# Patient Record
Sex: Female | Born: 1975 | Race: Asian | Hispanic: No | Marital: Married | Smoking: Never smoker
Health system: Southern US, Community
[De-identification: ages and names within clinical notes are randomized; demographics above are authoritative.]

## PROBLEM LIST (undated history)

## (undated) DIAGNOSIS — Z8619 Personal history of other infectious and parasitic diseases: Secondary | ICD-10-CM

## (undated) DIAGNOSIS — Z789 Other specified health status: Secondary | ICD-10-CM

## (undated) DIAGNOSIS — Z98891 History of uterine scar from previous surgery: Secondary | ICD-10-CM

## (undated) DIAGNOSIS — Z5189 Encounter for other specified aftercare: Secondary | ICD-10-CM

## (undated) DIAGNOSIS — Z8632 Personal history of gestational diabetes: Secondary | ICD-10-CM

## (undated) DIAGNOSIS — Z87898 Personal history of other specified conditions: Secondary | ICD-10-CM

## (undated) HISTORY — DX: History of uterine scar from previous surgery: Z98.891

## (undated) HISTORY — PX: TUBAL LIGATION: SHX77

## (undated) HISTORY — PX: OTHER SURGICAL HISTORY: SHX169

## (undated) HISTORY — DX: Personal history of other infectious and parasitic diseases: Z86.19

## (undated) HISTORY — PX: CERVICAL FUSION: SHX112

## (undated) HISTORY — DX: Personal history of gestational diabetes: Z86.32

## (undated) HISTORY — DX: Personal history of other specified conditions: Z87.898

## (undated) HISTORY — DX: Encounter for other specified aftercare: Z51.89

---

## 2004-03-23 HISTORY — PX: BREAST CYST EXCISION: SHX579

## 2010-01-16 ENCOUNTER — Encounter: Payer: Self-pay | Admitting: Internal Medicine

## 2010-02-24 ENCOUNTER — Encounter: Payer: Self-pay | Admitting: Internal Medicine

## 2010-04-11 ENCOUNTER — Ambulatory Visit
Admission: RE | Admit: 2010-04-11 | Discharge: 2010-04-11 | Payer: Self-pay | Source: Home / Self Care | Attending: Internal Medicine | Admitting: Internal Medicine

## 2010-04-11 DIAGNOSIS — R51 Headache: Secondary | ICD-10-CM | POA: Insufficient documentation

## 2010-04-11 DIAGNOSIS — R519 Headache, unspecified: Secondary | ICD-10-CM | POA: Insufficient documentation

## 2010-04-11 DIAGNOSIS — N644 Mastodynia: Secondary | ICD-10-CM | POA: Insufficient documentation

## 2010-04-30 NOTE — Assessment & Plan Note (Signed)
Summary: NEW TO EST UMR/MHF   Vital Signs:  Patient profile:   35 year old female Menstrual status:  regular LMP:     10/24/2009 Height:      62.5 inches Weight:      122 pounds BMI:     22.04 O2 Sat:      100 % on Room air Temp:     97.9 degrees F oral Pulse rate:   77 / minute Resp:     18 per minute BP sitting:   100 / 60  (right arm) Cuff size:   regular  Vitals Entered By: Glendell Docker CMA (April 11, 2010 9:46 AM)  O2 Flow:  Room air CC: New Patient  Is Patient Diabetic? No Pain Assessment Patient in pain? no      Comments c/o increase in headaches with cold weather, evaluation of left breast lump, onset two weeks ago-no pain,  LMP (date): 10/24/2009     Menstrual Status regular Enter LMP: 10/24/2009   Primary Care Provider:  Dondra Spry DO  CC:  New Patient .  History of Present Illness: 35  y/o female to establish unilateral headache cold weather makes it worse dull ache  (some improvement with tylenol) some nausea no photophobia severity 8 out 10 frequency no changes in vision  no prev history of migraines  she has check her BP at home when she has headache - reported normal  she also c/o left breat pain no palpalbe mass  no change in skin texture  Preventive Screening-Counseling & Management  Alcohol-Tobacco     Alcohol drinks/day: 0     Smoking Status: never  Caffeine-Diet-Exercise     Caffeine use/day: 1 beverage daily     Does Patient Exercise: no  Allergies (verified): No Known Drug Allergies  Past History:  Past Surgical History: Caesarean section    Family History: Family History of Arthritis Family History Diabetes 1st degree relative Family History High cholesterol Family History Hypertension CAD - MGF  cervical cancer - mother colon cancer - aunt asthma - brother  Social History: Married- one year 1 son Smoking Status:  never Caffeine use/day:  1 beverage daily Does Patient Exercise:  no  Review of  Systems  The patient denies fever, chest pain, syncope, dyspnea on exertion, abdominal pain, melena, hematochezia, severe indigestion/heartburn, and depression.    Physical Exam  General:  alert, well-developed, and well-nourished.   Head:  normocephalic and atraumatic.   Eyes:  pupils equal, pupils round, and pupils reactive to light.   Ears:  R ear normal and L ear normal.   Mouth:  pharynx pink and moist.   Neck:  No deformities, masses, or tenderness noted. Breasts:  No mass, nodules, thickening, tenderness, bulging, retraction, inflamation, nipple discharge or skin changes noted.   Lungs:  normal respiratory effort, normal breath sounds, no crackles, and no wheezes.   Heart:  normal rate, regular rhythm, no murmur, and no gallop.   Abdomen:  soft, non-tender, normal bowel sounds, no masses, no hepatomegaly, and no splenomegaly.   Extremities:  No lower extremity edema  Neurologic:  cranial nerves II-XII intact, gait normal, DTRs symmetrical and normal, and finger-to-nose normal.   Psych:  normally interactive, good eye contact, not anxious appearing, and not depressed appearing.     Impression & Recommendations:  Problem # 1:  BREAST PAIN, LEFT (ICD-611.71) pt c/o breast tenderness.  no palpable mass obtain mammo trial of avoiding caffeine and vit E Patient advised to call office  if symptoms persist or worsen.  Orders: Mammogram (Screening) (Mammo)  Problem # 2:  HEADACHE (ICD-784.0) question headaches related to sinus congestion worse with exposure to cold temps trial of antihistamine  Complete Medication List: 1)  Zyrtec Allergy 10 Mg Tabs (Cetirizine hcl) .... One by mouth once daily  Patient Instructions: 1)  Please schedule a follow-up appointment in 1 month. 2)  Call our office if your symptoms do not  improve or gets worse. 3)  Avoid caffeinated beverages 4)  You can try taking Vitamin E 800 International Units over the counter once daily x 2 weeks   Orders  Added: 1)  Mammogram (Screening) [Mammo] 2)  New Patient Level III [16109]   Immunization History:  Tetanus/Td Immunization History:    Tetanus/Td:  historical (05/28/2004)  Influenza Immunization History:    Influenza:  historical (02/04/2010)   Immunization History:  Tetanus/Td Immunization History:    Tetanus/Td:  Historical (05/28/2004)  Influenza Immunization History:    Influenza:  Historical (02/04/2010)  Current Allergies (reviewed today): No known allergies

## 2010-04-30 NOTE — Medication Information (Signed)
Summary: [facility]   Imported By: Sherian Rein 04/22/2010 09:27:08  _____________________________________________________________________  External Attachment:    Type:   Image     Comment:   External Document

## 2010-07-01 ENCOUNTER — Encounter: Payer: 59 | Attending: Obstetrics and Gynecology | Admitting: Dietician

## 2010-07-01 DIAGNOSIS — O99810 Abnormal glucose complicating pregnancy: Secondary | ICD-10-CM | POA: Insufficient documentation

## 2010-07-01 DIAGNOSIS — Z713 Dietary counseling and surveillance: Secondary | ICD-10-CM | POA: Insufficient documentation

## 2010-07-23 ENCOUNTER — Inpatient Hospital Stay (HOSPITAL_COMMUNITY): Admission: AD | Admit: 2010-07-23 | Payer: 59 | Source: Ambulatory Visit | Admitting: Obstetrics and Gynecology

## 2010-08-01 ENCOUNTER — Inpatient Hospital Stay (HOSPITAL_COMMUNITY): Admission: AD | Admit: 2010-08-01 | Payer: Self-pay | Admitting: Obstetrics and Gynecology

## 2010-08-05 ENCOUNTER — Other Ambulatory Visit: Payer: Self-pay | Admitting: Obstetrics and Gynecology

## 2010-08-05 ENCOUNTER — Encounter (HOSPITAL_COMMUNITY): Payer: 59

## 2010-08-05 LAB — BASIC METABOLIC PANEL
BUN: 8 mg/dL (ref 6–23)
Chloride: 104 mEq/L (ref 96–112)
Creatinine, Ser: 0.5 mg/dL (ref 0.4–1.2)
GFR calc non Af Amer: >60 mL/min (ref >60)
Glucose, Bld: 118 mg/dL — ABNORMAL HIGH (ref 70–99)
Potassium: 3.6 mEq/L (ref 3.5–5.1)

## 2010-08-05 LAB — CBC
HCT: 32 % — ABNORMAL LOW (ref 36.0–46.0)
MCH: 26.7 pg (ref 26.0–34.0)
MCV: 82.9 fL (ref 78.0–100.0)
Platelets: 215 10*3/uL (ref 150–400)
RDW: 15.7 % — ABNORMAL HIGH (ref 11.5–15.5)

## 2010-08-05 LAB — SURGICAL PCR SCREEN: Staphylococcus aureus: NEGATIVE

## 2010-08-06 ENCOUNTER — Other Ambulatory Visit: Payer: Self-pay | Admitting: Obstetrics and Gynecology

## 2010-08-06 ENCOUNTER — Inpatient Hospital Stay (HOSPITAL_COMMUNITY)
Admission: RE | Admit: 2010-08-06 | Discharge: 2010-08-09 | DRG: 766 | Disposition: A | Discharge status: Home / Self Care | Payer: 59 | Source: Ambulatory Visit | Attending: Obstetrics and Gynecology | Admitting: Obstetrics and Gynecology

## 2010-08-06 DIAGNOSIS — O9903 Anemia complicating the puerperium: Secondary | ICD-10-CM | POA: Diagnosis not present

## 2010-08-06 DIAGNOSIS — Z01812 Encounter for preprocedural laboratory examination: Secondary | ICD-10-CM

## 2010-08-06 DIAGNOSIS — D649 Anemia, unspecified: Secondary | ICD-10-CM | POA: Diagnosis not present

## 2010-08-06 DIAGNOSIS — O99814 Abnormal glucose complicating childbirth: Secondary | ICD-10-CM | POA: Diagnosis present

## 2010-08-06 DIAGNOSIS — O34219 Maternal care for unspecified type scar from previous cesarean delivery: Principal | ICD-10-CM | POA: Diagnosis present

## 2010-08-06 DIAGNOSIS — Z01818 Encounter for other preprocedural examination: Secondary | ICD-10-CM

## 2010-08-06 LAB — GLUCOSE, CAPILLARY: Glucose-Capillary: 82 mg/dL (ref 70–99)

## 2010-08-07 LAB — CBC
HCT: 26.2 % — ABNORMAL LOW (ref 36.0–46.0)
Hemoglobin: 8.4 g/dL — ABNORMAL LOW (ref 12.0–15.0)
MCV: 83.2 fL (ref 78.0–100.0)
RDW: 15.7 % — ABNORMAL HIGH (ref 11.5–15.5)
WBC: 16.9 10*3/uL — ABNORMAL HIGH (ref 4.0–10.5)

## 2010-08-08 LAB — GLUCOSE, CAPILLARY

## 2010-08-11 NOTE — Op Note (Signed)
[name], [name]           ACCOUNT NO.:  [id number]  MEDICAL RECORD NO.:  [id number]           PATIENT TYPE:  I  LOCATION:  9127                          FACILITY:  [facility]  PHYSICIAN:  [name], M.D. DATE OF BIRTH:  10/01/1975  DATE OF PROCEDURE:  08/06/2010 DATE OF DISCHARGE:                              OPERATIVE REPORT   PREOPERATIVE DIAGNOSES: 1. Intrauterine pregnancy at 39 weeks and 6 days. 2. Previous cesarean section.  POSTOPERATIVE DIAGNOSES: 1. Intrauterine pregnancy at 39 weeks and 6 days. 2. Previous cesarean section.  ANESTHESIA:  Spinal, Natalie Pou, MD  PROCEDURE:  Repeat low transverse cesarean section.  SURGEON:  Natalie Fat. Avrian Delfavero, MD  ASSISTANT:  [name], WHNP, CNM  ESTIMATED BLOOD LOSS:  900 mL.  DESCRIPTION OF PROCEDURE:  After being informed of the planned procedure with possible complications including bleeding, infection, injury to other organs, informed consent was obtained.  The patient was taken to OR #2, given spinal anesthesia by Dr. Malen Fields without any complications. She was placed in the dorsal decubitus position, pelvis tilted to the left, prepped and draped in a sterile fashion, and a Foley catheter was inserted in her bladder.  After assessing adequate level of anesthesia, we infiltrated the suprapubic area with 20 mL of Marcaine 0.25 and performed a Pfannenstiel incision which was brought down sharply to the fascia.  The fascia was incised in a low transverse fashion.  Linea alba was dissected.  Peritoneum was entered bluntly.  An Alexis retractor was easily inserted and we proceeded with lysis of adhesion between the bladder fold and the mid uterine body.  This then gives [place] access to the visceral peritoneum that was entered in a low transverse fashion, allowing [place] to safely retract bladder by developing a bladder flap.  We can then enter the lower uterine segment first with knife, then extended bluntly in a low  transverse fashion.  Amniotic fluid was clear.  We assisted the birth of a female infant at 1:31 p.m.  Mouth and nose were suctioned.  Two nuchal cords were reduced.  Baby was delivered.  Cord was clamped with 2 Kelly clamps and suctioned and baby was given to the neonatologist present in the room.  A 10 mL of blood was drawn from the umbilical vein and the placenta was allowed to deliver spontaneously.  It was complete.  Cord has 3 vessels. It was sent to Labor and Delivery.  Uterine revision was negative.  The myometrium was closed in two layers, first with a running locked suture of 0 Vicryl, then with a Lembert suture of 0 Vicryl, imbricating the first one.  Hemostasis was then completed on the right angle with 3 figure-of-eight stitches of 0 Vicryl and midline with 1 figure-of-eight stitch of 0 Vicryl.  We then completed hemostasis with cauterization of peritoneal edges.  Both paracolic gutters were cleaned.  Both tubes and ovaries assessed and normal.  The pelvis was profusely irrigated with warm saline to confirm a satisfactory hemostasis.  Under fascia hemostasis was completed with cauterization and the fascia was closed with 2 running sutures of 1 Vicryl, meeting midline.  Wound  was irrigated with warm saline.  Hemostasis was adequate.  We closed the skin with a subcuticular suture of 3-0 Monocryl and Steri-Strips.  Instrument and sponge count was complete x2.  Estimated blood loss was 900 mL.  The procedure was well tolerated by the patient who was taken to recovery room in a well and stable condition.  [name], [name], was born at 1:31 p.m., received an Apgar of 9 at 1 minute and 9 at 5 minutes and weighed 7 pounds and 11 ounces.  SPECIMEN:  Placenta sent to Labor and Delivery.     Natalie Fat Ayaana Biondo, M.D.     SAR/MEDQ  D:  08/06/2010  T:  08/07/2010  Job:  [id number]  Electronically Signed by [name] M.D. on 08/11/2010 04:48:16 PM

## 2010-09-10 NOTE — Discharge Summary (Signed)
NAME:  [name], [name]           ACCOUNT NO.:  [id number]  MEDICAL RECORD NO.:  [id number]           PATIENT TYPE:  LOCATION:                                 FACILITY:  PHYSICIAN:  Kadijah Shamoon A. Anton Cheramie, M.D. DATE OF BIRTH:  Oct 29, 1975  DATE OF ADMISSION:  08/06/2010 DATE OF DISCHARGE:  08/09/2010                              DISCHARGE SUMMARY   ADMITTING DIAGNOSES: 1. Previous C-section, desired repeat for suspected macrosomia. 2. Class A1 gestational diabetes mellitus.  DISCHARGE DIAGNOSES: 1. Previous C-section, desired repeat for suspected macrosomia. 2. Class A1 gestational diabetes mellitus.  HOSPITAL PROCEDURES:  Low-transverse cesarean section under spinal anesthesia.  HOSPITAL COURSE:  She presented on surgery day to same day surgery admission, was found to be stable desiring a repeat C-section for suspected macrosomic infant.  By ultrasound on the previous day the baby was 8+ pounds and she had [place]  She proceeded to the OR, had spinal anesthesia placed and delivered a female "[name]" at 13:31, Apgar's were 9 at 1 minutes and 9 at 5 minutes, weight was 7 pounds 11 ounces the infant was taken to the newborn nursery in stable condition.  Placenta was taken to labor and delivery.  She did have 2 nuchal cords noted at the delivery.  After surgery she was moved to the PACU where she was deemed stable and sent to the Mother Baby Unit.   Her hospital course included: on postop day #0 she had adequate urine output which was clear. Vital signs were stable,  she was afebrile, and receiving minimal narcotic anesthesia for pain management.    On postop day #1, she was up ad lib after her Foley was removed, voiding and ambulating without difficulty and her Vital signs were stable.  Vaginal bleeding was light.  She was tolerating a regular diet.  She was breast and bottle feeding the baby. Her vital signs were 98.7, 76, 18, and 95/65.  Her  white count was 16.9 up from 11,000 the previous day, H&H 8.4 and 26.2 down from 10.3 and 32 the previous day and her platelet count was 190 down from 215.  Her lungs were clear.  Her abdomen was soft.  She had SCDs on and her blood glucose levels were 70s-80s on just a routine diet.    On postop day #2, she was doing well about a bed, tolerating pain with minimal narcotic use and her incision was open to air with Steri-Strips and was clean, dry, and intact.  Her fundus was firm.  She had minimal bleeding.  She had positive bowel sounds and passing flatus.    On postop day #3, she was ambulating and voiding on her own, and tolerating activities of daily living without difficulty.  Her vitals were stable 97.7, 99, 20, and 94/61.  Blood glucose was 73 on a regular diet.  She did desire discharge home and she was discharged home in stable condition after receiving the full course of the benefits of her stay.    Her discharge instructions were per the [facility] handout.  Specific instructions for no driving x2 weeks  or while on narcotic medications.  She was discharged home on the following medications: 1. Percocet 5/325, dispensed #40, 1-2 q.4-6 h. p.r.n. severe pain. 2. Motrin 600 mg 1 p.o. q.6 h. p.r.n. pain.  Discharge followup is in 6 weeks with [facility] or p.r.n.    ______________________________ Anabel Halon, [facility], CNM   ______________________________ Pierre Bali. Normand Sloop, M.D.    DT/MEDQ  D:  08/09/2010  T:  08/09/2010  Job:  [id number]  Electronically Signed by Anabel Halon CNM on 08/10/2010 08:41:51 PM Electronically Signed by Jaymes Graff M.D. on 09/10/2010 10:37:24 AM

## 2011-03-20 ENCOUNTER — Other Ambulatory Visit (INDEPENDENT_AMBULATORY_CARE_PROVIDER_SITE_OTHER): Payer: 59

## 2011-03-20 DIAGNOSIS — Z0000 Encounter for general adult medical examination without abnormal findings: Secondary | ICD-10-CM

## 2011-03-20 LAB — CBC WITH DIFFERENTIAL/PLATELET
Basophils Absolute: 0.1 10*3/uL (ref 0.0–0.1)
Eosinophils Absolute: 0.1 10*3/uL (ref 0.0–0.7)
HCT: 33.5 % — ABNORMAL LOW (ref 36.0–46.0)
Hemoglobin: 11.5 g/dL — ABNORMAL LOW (ref 12.0–15.0)
Lymphs Abs: 3.5 10*3/uL (ref 0.7–4.0)
MCHC: 34.2 g/dL (ref 30.0–36.0)
Monocytes Absolute: 0.7 10*3/uL (ref 0.1–1.0)
Neutro Abs: 5.9 10*3/uL (ref 1.4–7.7)
RDW: 12.8 % (ref 11.5–14.6)

## 2011-03-20 LAB — BASIC METABOLIC PANEL
CO2: 24 mEq/L (ref 19–32)
Glucose, Bld: 89 mg/dL (ref 70–99)
Potassium: 3.7 mEq/L (ref 3.5–5.1)
Sodium: 138 mEq/L (ref 135–145)

## 2011-03-20 LAB — TSH: TSH: 0.81 u[IU]/mL (ref 0.35–5.50)

## 2011-03-20 LAB — POCT URINALYSIS DIPSTICK
Bilirubin, UA: NEGATIVE
Blood, UA: NEGATIVE
Ketones, UA: NEGATIVE
Leukocytes, UA: NEGATIVE
pH, UA: 6.5

## 2011-03-20 LAB — HEPATIC FUNCTION PANEL: Albumin: 3.7 g/dL (ref 3.5–5.2)

## 2011-03-20 LAB — LIPID PANEL
HDL: 34.4 mg/dL — ABNORMAL LOW (ref >39.00)
Triglycerides: 91 mg/dL (ref 0.0–149.0)

## 2011-03-25 ENCOUNTER — Encounter: Payer: Self-pay | Admitting: Internal Medicine

## 2011-03-25 ENCOUNTER — Ambulatory Visit (INDEPENDENT_AMBULATORY_CARE_PROVIDER_SITE_OTHER): Payer: 59 | Admitting: Internal Medicine

## 2011-03-25 VITALS — BP 112/72 | HR 88 | Temp 98.0°F | Ht 62.0 in | Wt 119.0 lb

## 2011-03-25 DIAGNOSIS — Z23 Encounter for immunization: Secondary | ICD-10-CM

## 2011-03-25 DIAGNOSIS — Z0000 Encounter for general adult medical examination without abnormal findings: Secondary | ICD-10-CM

## 2011-03-25 NOTE — Assessment & Plan Note (Signed)
Reviewed adult health maintenance protocols. She is pregnant.  Patient updated with influenza vaccine. Patient advised to continue prenatal vitamins with iron. She will followup with her OB.

## 2011-03-25 NOTE — Progress Notes (Signed)
  Subjective:    Patient ID: [name], female    DOB: February 23, 1976, 36 y.o.   MRN: [id number]  HPI  36 year old Asian female for routine physical. In May of 2012 she gave birth to a healthy baby girl. She had C-section. There was note of anemia with hemoglobin of 8.4 7 months ago. Her most recent hemoglobin is improved to 11.5. She is currently taking prenatal vitamins with iron. She occasionally eats red meat.  Reviewed other labs. Thyroid studies normal. Electrolytes, kidney function, LFTs are normal.  She recently had positive pregnancy test over Christmas.  Review of Systems  ROS  Constitutional: Negative for activity change, appetite change and unexpected weight change.  Eyes: Negative for visual disturbance.  Respiratory: Negative for cough, chest tightness and shortness of breath.   Cardiovascular: Negative for chest pain.  Genitourinary: Negative for difficulty urinating.  Neurological: Negative for headaches.  Gastrointestinal: Negative for abdominal pain, heartburn melena or hematochezia Psych: Negative for depression or anxiety She had gestational diabetes with her previous pregnancy  No past medical history on file.  History   Social History  . Marital Status: Married    Spouse Name: N/A    Number of Children: N/A  . Years of Education: N/A   Occupational History  . Not on file.   Social History Main Topics  . Smoking status: Never Smoker   . Smokeless tobacco: Not on file  . Alcohol Use: No  . Drug Use: No  . Sexually Active: Not on file   Other Topics Concern  . Not on file   Social History Narrative  . No narrative on file    Past Surgical History  Procedure Date  . Cesarean section     Family History  Problem Relation Age of Onset  . Cancer Mother     cervical  . Arthritis    . Diabetes    . Hyperlipidemia    . Hypertension    . Coronary artery disease Maternal Grandfather   . Asthma Brother   . Cancer Maternal Aunt     colon     No Known Allergies  No current outpatient prescriptions on file prior to visit.    BP 112/72  Pulse 88  Temp(Src) 98 F (36.7 C) (Oral)  Ht 5\' 2"  (1.575 m)  Wt 119 lb (53.978 kg)  BMI 21.77 kg/m2      Objective:   Physical Exam  Constitutional: She is oriented to person, place, and time. She appears well-developed and well-nourished. No distress.  HENT:  Head: Normocephalic and atraumatic.  Right Ear: External ear normal.  Left Ear: External ear normal.  Eyes: Pupils are equal, round, and reactive to light.  Neck: Neck supple. No thyromegaly present.  Cardiovascular: Normal rate, regular rhythm and normal heart sounds.   Pulmonary/Chest: Effort normal and breath sounds normal. No respiratory distress. She has no wheezes. She has no rales.  Abdominal: Soft. Bowel sounds are normal. She exhibits no mass. There is no tenderness. There is no rebound.  Musculoskeletal: Normal range of motion. She exhibits no edema.  Lymphadenopathy:    She has no cervical adenopathy.  Neurological: She is alert and oriented to person, place, and time. No cranial nerve deficit.  Skin: Skin is warm and dry.  Psychiatric: She has a normal mood and affect. Her behavior is normal.      Assessment & Plan:

## 2011-04-14 LAB — RUBELLA ANTIBODY, IGM: Rubella: IMMUNE

## 2011-04-14 LAB — GC/CHLAMYDIA PROBE AMP, GENITAL
Chlamydia: NEGATIVE
Gonorrhea: NEGATIVE

## 2011-06-05 ENCOUNTER — Encounter: Payer: Self-pay | Admitting: Obstetrics and Gynecology

## 2011-06-05 ENCOUNTER — Encounter (INDEPENDENT_AMBULATORY_CARE_PROVIDER_SITE_OTHER): Payer: 59 | Admitting: Obstetrics and Gynecology

## 2011-06-05 ENCOUNTER — Encounter (INDEPENDENT_AMBULATORY_CARE_PROVIDER_SITE_OTHER): Payer: 59

## 2011-06-05 DIAGNOSIS — Z1389 Encounter for screening for other disorder: Secondary | ICD-10-CM

## 2011-06-23 ENCOUNTER — Other Ambulatory Visit: Payer: Self-pay

## 2011-06-23 DIAGNOSIS — O358XX0 Maternal care for other (suspected) fetal abnormality and damage, not applicable or unspecified: Secondary | ICD-10-CM

## 2011-07-02 ENCOUNTER — Encounter: Payer: 59 | Admitting: Obstetrics and Gynecology

## 2011-07-02 ENCOUNTER — Other Ambulatory Visit: Payer: 59

## 2011-07-03 ENCOUNTER — Encounter: Payer: 59 | Admitting: Obstetrics and Gynecology

## 2011-07-03 ENCOUNTER — Other Ambulatory Visit: Payer: 59

## 2011-07-03 ENCOUNTER — Ambulatory Visit: Payer: 59

## 2011-07-03 DIAGNOSIS — O358XX0 Maternal care for other (suspected) fetal abnormality and damage, not applicable or unspecified: Secondary | ICD-10-CM

## 2011-07-09 DIAGNOSIS — O09529 Supervision of elderly multigravida, unspecified trimester: Secondary | ICD-10-CM

## 2011-07-09 DIAGNOSIS — N926 Irregular menstruation, unspecified: Secondary | ICD-10-CM | POA: Insufficient documentation

## 2011-07-09 DIAGNOSIS — O34219 Maternal care for unspecified type scar from previous cesarean delivery: Secondary | ICD-10-CM

## 2011-07-09 DIAGNOSIS — O24419 Gestational diabetes mellitus in pregnancy, unspecified control: Secondary | ICD-10-CM

## 2011-07-10 ENCOUNTER — Ambulatory Visit (INDEPENDENT_AMBULATORY_CARE_PROVIDER_SITE_OTHER): Payer: 59 | Admitting: Obstetrics and Gynecology

## 2011-07-10 ENCOUNTER — Encounter: Payer: Self-pay | Admitting: Obstetrics and Gynecology

## 2011-07-10 VITALS — BP 120/60 | Wt 124.0 lb

## 2011-07-10 DIAGNOSIS — Z331 Pregnant state, incidental: Secondary | ICD-10-CM

## 2011-07-10 NOTE — Progress Notes (Signed)
Quad screen normal Needs ultrasound at next visit to complete anatomy Early glucola today

## 2011-07-10 NOTE — Progress Notes (Signed)
Pt. Stated no issues today.  

## 2011-07-23 ENCOUNTER — Other Ambulatory Visit: Payer: 59

## 2011-07-23 ENCOUNTER — Encounter: Payer: 59 | Admitting: Obstetrics and Gynecology

## 2011-07-28 ENCOUNTER — Ambulatory Visit (INDEPENDENT_AMBULATORY_CARE_PROVIDER_SITE_OTHER): Payer: 59

## 2011-07-28 ENCOUNTER — Ambulatory Visit (INDEPENDENT_AMBULATORY_CARE_PROVIDER_SITE_OTHER): Payer: 59 | Admitting: Obstetrics and Gynecology

## 2011-07-28 VITALS — BP 100/62 | Wt 125.0 lb

## 2011-07-28 DIAGNOSIS — Z331 Pregnant state, incidental: Secondary | ICD-10-CM

## 2011-07-28 LAB — US OB FOLLOW UP

## 2011-07-28 NOTE — Progress Notes (Signed)
Sono:  EFW 1lbs 10 oz  63%             All anatomy seen and normal             Cervix 3.31 cm Last 1 hour glucola 127  Repeat at next visit No complaints

## 2011-07-28 NOTE — Progress Notes (Signed)
Pt. Stated no issues today.  

## 2011-08-26 ENCOUNTER — Ambulatory Visit (INDEPENDENT_AMBULATORY_CARE_PROVIDER_SITE_OTHER): Payer: 59 | Admitting: Obstetrics and Gynecology

## 2011-08-26 ENCOUNTER — Other Ambulatory Visit: Payer: 59

## 2011-08-26 VITALS — BP 98/54 | Wt 132.0 lb

## 2011-08-26 DIAGNOSIS — Z331 Pregnant state, incidental: Secondary | ICD-10-CM

## 2011-08-26 LAB — CBC
MCH: 30 pg (ref 26.0–34.0)
MCHC: 33.5 g/dL (ref 30.0–36.0)
MCV: 89.5 fL (ref 78.0–100.0)
Platelets: 236 10*3/uL (ref 150–400)
RDW: 13.7 % (ref 11.5–15.5)
WBC: 14.7 10*3/uL — ABNORMAL HIGH (ref 4.0–10.5)

## 2011-08-26 NOTE — Progress Notes (Signed)
Reports congestion. Started Zyrtec. Informed can add Sudafed PRN. Also C/O right shoulder pain and numbness at night: reviewed sleep hygiene Will schedule repeat cesarean with BTL (sign tubal papers today)

## 2011-08-26 NOTE — Progress Notes (Signed)
Pt. Stated having some congestion . Glucola today Given @9 :59 Draw @10 :59 .

## 2011-08-28 ENCOUNTER — Telehealth: Payer: Self-pay

## 2011-08-28 ENCOUNTER — Telehealth: Payer: Self-pay | Admitting: Obstetrics and Gynecology

## 2011-08-28 NOTE — Telephone Encounter (Signed)
LM for pt that 3 hr gtt needs to be sch per SR. Pt told she could speak with anyone in triage if ld unavailable.  ld

## 2011-08-28 NOTE — Telephone Encounter (Signed)
Pt returning your call

## 2011-08-28 NOTE — Telephone Encounter (Signed)
Message copied by Larwance Rote on Fri Aug 28, 2011 11:32 AM ------      Message from: Silverio Lay      Created: Fri Aug 28, 2011  8:07 AM       Needs 3 hr GTT

## 2011-08-28 NOTE — Telephone Encounter (Signed)
LMTC on Monday to sch 3 hr gtt.  ld

## 2011-08-31 ENCOUNTER — Telehealth: Payer: Self-pay | Admitting: Obstetrics and Gynecology

## 2011-09-01 ENCOUNTER — Telehealth: Payer: Self-pay | Admitting: Obstetrics and Gynecology

## 2011-09-01 NOTE — Telephone Encounter (Signed)
Laura/sr pt °

## 2011-09-01 NOTE — Telephone Encounter (Signed)
Pt sch for 3 hr gtt on 09-03-11 at [facility] building.  Pt states she has to have the test done on Thurs. No other available time for her to go. Stressed the importance of starting the diet today and still may not be completely accurate.Pt states understanding and still wants to do Thurs.  Fax order sent to [facility].  ld

## 2011-09-01 NOTE — Telephone Encounter (Signed)
Called pt on home and cell left mess to call back to sch 3 hr GTT.

## 2011-09-02 ENCOUNTER — Telehealth: Payer: Self-pay | Admitting: Obstetrics and Gynecology

## 2011-09-02 NOTE — Telephone Encounter (Signed)
Repeat C/S with BTL scheduled for 11/11/11 @ 8:30 with SR/cnm. Adrianne Pridgen

## 2011-09-03 ENCOUNTER — Other Ambulatory Visit: Payer: Self-pay | Admitting: Obstetrics and Gynecology

## 2011-09-14 ENCOUNTER — Ambulatory Visit (INDEPENDENT_AMBULATORY_CARE_PROVIDER_SITE_OTHER): Payer: 59 | Admitting: Obstetrics and Gynecology

## 2011-09-14 VITALS — BP 98/50 | Wt 132.0 lb

## 2011-09-14 DIAGNOSIS — Z331 Pregnant state, incidental: Secondary | ICD-10-CM

## 2011-09-14 NOTE — Progress Notes (Signed)
Pt. Stated no issues today.  

## 2011-09-14 NOTE — Progress Notes (Signed)
3 hr GTT 09/03/11 normal No complaints

## 2011-09-28 ENCOUNTER — Ambulatory Visit (INDEPENDENT_AMBULATORY_CARE_PROVIDER_SITE_OTHER): Payer: 59 | Admitting: Obstetrics and Gynecology

## 2011-09-28 ENCOUNTER — Other Ambulatory Visit: Payer: Self-pay | Admitting: Obstetrics and Gynecology

## 2011-09-28 VITALS — BP 98/58 | Wt 133.0 lb

## 2011-09-28 DIAGNOSIS — Z331 Pregnant state, incidental: Secondary | ICD-10-CM

## 2011-09-28 MED ORDER — ZOLPIDEM TARTRATE 10 MG PO TABS: 5.0000 mg | ORAL_TABLET | Freq: Every evening | ORAL | Status: DC | PRN

## 2011-09-28 NOTE — Progress Notes (Signed)
Pt. Stated no issues today.  

## 2011-09-28 NOTE — Telephone Encounter (Signed)
Ambien rx did not go through per pt husband.  Called rx in by phone. ld

## 2011-09-28 NOTE — Progress Notes (Signed)
C/O insomnia despite Unisom: will try Ambien

## 2011-09-28 NOTE — Telephone Encounter (Signed)
Laura/sr

## 2011-10-13 ENCOUNTER — Encounter: Payer: Self-pay | Admitting: Obstetrics and Gynecology

## 2011-10-13 ENCOUNTER — Ambulatory Visit (INDEPENDENT_AMBULATORY_CARE_PROVIDER_SITE_OTHER): Payer: 59 | Admitting: Obstetrics and Gynecology

## 2011-10-13 VITALS — BP 96/58 | Ht 64.0 in | Wt 136.0 lb

## 2011-10-13 DIAGNOSIS — O34219 Maternal care for unspecified type scar from previous cesarean delivery: Secondary | ICD-10-CM

## 2011-10-13 DIAGNOSIS — R81 Glycosuria: Secondary | ICD-10-CM

## 2011-10-13 NOTE — Progress Notes (Signed)
Dextrostick=94. Pt had 2 1/2 cups of rice with water prior to appt today.  Will re-sign consent for tubal today.

## 2011-10-21 ENCOUNTER — Encounter: Payer: Self-pay | Admitting: Obstetrics and Gynecology

## 2011-10-21 ENCOUNTER — Ambulatory Visit (INDEPENDENT_AMBULATORY_CARE_PROVIDER_SITE_OTHER): Payer: 59 | Admitting: Obstetrics and Gynecology

## 2011-10-21 VITALS — BP 112/60 | Wt 137.0 lb

## 2011-10-21 DIAGNOSIS — Z3685 Encounter for antenatal screening for Streptococcus B: Secondary | ICD-10-CM

## 2011-10-21 DIAGNOSIS — O34219 Maternal care for unspecified type scar from previous cesarean delivery: Secondary | ICD-10-CM

## 2011-10-21 NOTE — Progress Notes (Signed)
ROB follow up. No c/o. GBS done today. Pt to RTO in one week.

## 2011-10-22 ENCOUNTER — Inpatient Hospital Stay (HOSPITAL_COMMUNITY): Admission: AD | Admit: 2011-10-22 | Payer: Self-pay | Source: Ambulatory Visit | Admitting: Obstetrics and Gynecology

## 2011-10-24 LAB — CULTURE, BETA STREP (GROUP B ONLY)

## 2011-10-27 ENCOUNTER — Ambulatory Visit (INDEPENDENT_AMBULATORY_CARE_PROVIDER_SITE_OTHER): Payer: 59 | Admitting: Obstetrics and Gynecology

## 2011-10-27 VITALS — BP 110/70 | Wt 138.0 lb

## 2011-10-27 DIAGNOSIS — Z331 Pregnant state, incidental: Secondary | ICD-10-CM

## 2011-10-27 LAB — GLUCOSE, POCT (MANUAL RESULT ENTRY): POC Glucose: 115 mg/dl — AB (ref 70–99)

## 2011-10-27 NOTE — Progress Notes (Signed)
Pt declines cervix check today. Pt states she has no concerns.  Pt had a bowl of ice cream this afternoon after 2pm Blood sugar is 3+.115

## 2011-10-27 NOTE — Progress Notes (Signed)
[redacted]w[redacted]d Doing well. Wants a cesarean section and tubal ligation. Return office in 1 week. Dr. Stefano Gaul

## 2011-11-03 ENCOUNTER — Encounter (HOSPITAL_COMMUNITY): Payer: Self-pay

## 2011-11-03 ENCOUNTER — Encounter (HOSPITAL_COMMUNITY)
Admission: RE | Admit: 2011-11-03 | Discharge: 2011-11-03 | Disposition: A | Discharge status: Home / Self Care | Payer: 59 | Source: Ambulatory Visit | Attending: Obstetrics and Gynecology | Admitting: Obstetrics and Gynecology

## 2011-11-03 DIAGNOSIS — O09529 Supervision of elderly multigravida, unspecified trimester: Secondary | ICD-10-CM

## 2011-11-03 DIAGNOSIS — O24419 Gestational diabetes mellitus in pregnancy, unspecified control: Secondary | ICD-10-CM

## 2011-11-03 DIAGNOSIS — O34219 Maternal care for unspecified type scar from previous cesarean delivery: Secondary | ICD-10-CM

## 2011-11-03 HISTORY — DX: Other specified health status: Z78.9

## 2011-11-03 LAB — CBC
Platelets: 213 10*3/uL (ref 150–400)
RBC: 3.84 MIL/uL — ABNORMAL LOW (ref 3.87–5.11)
RDW: 15.2 % (ref 11.5–15.5)
WBC: 12 10*3/uL — ABNORMAL HIGH (ref 4.0–10.5)

## 2011-11-03 LAB — TYPE AND SCREEN
ABO/RH(D): O POS
Antibody Screen: NEGATIVE

## 2011-11-03 LAB — DIFFERENTIAL
Basophils Absolute: 0 10*3/uL (ref 0.0–0.1)
Lymphocytes Relative: 18 % (ref 12–46)
Lymphs Abs: 2.2 10*3/uL (ref 0.7–4.0)
Neutro Abs: 8.8 10*3/uL — ABNORMAL HIGH (ref 1.7–7.7)
Neutrophils Relative %: 74 % (ref 43–77)

## 2011-11-03 LAB — SURGICAL PCR SCREEN
MRSA, PCR: NEGATIVE
Staphylococcus aureus: NEGATIVE

## 2011-11-03 NOTE — Patient Instructions (Addendum)
20 [name]  11/03/2011   Your procedure is scheduled on:  11/11/11  Enter through the Main Entrance of [facility] at 7 AM.  Pick up the phone at the desk and dial [phone].   Call this number if you have problems the morning of surgery: [phone]   Remember:   Do not eat food:After Midnight.  Do not drink clear liquids: After Midnight.  Take these medicines the morning of surgery with A SIP OF WATER: NA   Do not wear jewelry, make-up or nail polish.  Do not wear lotions, powders, or perfumes. You may wear deodorant.  Do not shave 48 hours prior to surgery.  Do not bring valuables to the hospital.  Contacts, dentures or bridgework may not be worn into surgery.  Leave suitcase in the car. After surgery it may be brought to your room.  For patients admitted to the hospital, checkout time is 11:00 AM the day of discharge.   Patients discharged the day of surgery will not be allowed to drive home.  Name and phone number of your driver: NA  Special Instructions: CHG Shower Use Special Wash: 1/2 bottle night before surgery and 1/2 bottle morning of surgery.   Please read over the following fact sheets that you were given: MRSA Information

## 2011-11-05 ENCOUNTER — Ambulatory Visit (INDEPENDENT_AMBULATORY_CARE_PROVIDER_SITE_OTHER): Payer: 59 | Admitting: Obstetrics and Gynecology

## 2011-11-05 VITALS — BP 104/62 | Wt 142.0 lb

## 2011-11-05 DIAGNOSIS — Z331 Pregnant state, incidental: Secondary | ICD-10-CM

## 2011-11-05 NOTE — Progress Notes (Signed)
Pt. Stated no issues today.  

## 2011-11-05 NOTE — Progress Notes (Signed)
[redacted]w[redacted]d Well  No complaints

## 2011-11-10 NOTE — H&P (Signed)
[name] is a 36 y.o. female, G3P2002 at 36 3/7 weeks, presenting for scheduled repeat C/S and tubal ligation.    Pregnancy remarkable for: Patient Active Problem List  Diagnosis  . Pregnancy with history of caesarean section, antepartum  . Menstrual cycle disorder  . Gestational diabetes mellitus in pregnancy  . AMA (advanced maternal age) multigravida 35+  GDM in previous pregnancy--normal 3 hour this pregnancy Previous C/S x 2 Desires BTL  History of present pregnancy: Patient entered care at 6 weeks.  EDC of 11/15/11 was established by [place] at 16 weeks, due to 1 week difference in LMP dating and [place] date--hx of long cycles.  Initial [place] at 16 weeks revelaled most anatomy, with completion of anatomy views at 24 weeks, with normal findings and an posterior placenta.  Tubal consent was signed on 08/26/11.  She had a normal early glucola, but her 28 week glucola was elevated.  Subsequent 3 hour GTT was WNL.  Quad screen was WNL, and she declined amnio.     History OB History    Grav Para Term Preterm Abortions TAB SAB Ect Mult Living   3 2 2       2     2009--Primary LTCS in [place], FTP, 10 hour labor.  39 weeks, 8+3, Female. 2012--Repeat C/S with [facility].  7+11, female.  Past Medical History  Diagnosis Date  . History of chicken pox   . History of measles, mumps, or rubella   . History of gestational diabetes   . No pertinent past medical history    Past Surgical History  Procedure Date  . Cesarean section   . Breast cyst excision    Family History: family history includes Arthritis in an unspecified family member; Asthma in her brother; Cancer in her maternal aunt and mother; Coronary artery disease in her maternal grandfather; Diabetes in an unspecified family member; Hyperlipidemia in an unspecified family member; and Hypertension in an unspecified family member.  Social History:  reports that she has never smoked. She has never used smokeless tobacco. She reports that she  does not drink alcohol or use illicit drugs.  Pt is an Charity fundraiser, college-educated, Asian in ethnicity, and of the [address] faith.   Prenatal Transfer Tool  Maternal Diabetes: No Genetic Screening: Normal Quad screen--declined amnio Maternal Ultrasounds/Referrals: Normal Fetal Ultrasounds or other Referrals:  None Maternal Substance Abuse:  No Significant Maternal Medications:  None Significant Maternal Lab Results:  Lab values include: Group B Strep negative Other Comments:  None  ROS:  Occasional contractions, +FM    Last menstrual period 01/30/2011. Exam Physical Exam  Chest clear Heart RRR without murmur Abd gravid, NT Pelvic--deferred Ext WNL  FHR 150s by doppler in office UCs occasional by patient report  Prenatal labs: ABO, Rh: --/--/O POS (08/13 1155) Antibody: NEG (08/13 1141) Rubella: Immune (01/22 0000) RPR: NON REACTIVE (08/13 1141)  HBsAg: Negative (01/22 0000)  HIV: Non-reactive (01/22 0000)  GBS:   Negative Early glucola WNL 28 week glucola elevated, 3 hour GTT WNL TSH 0.81 at NOB. Quad screen WNL Pap and cultures Negative 1/13 Hgb 11.5 and NOB, 10.6 at 28 weeks   Assessment/Plan: IUP at 39 weeks Previous C/S x 2, with desire for repeat and BTL  Plan: Admit to [facility] on 11/11/11 per consult Dr. Estanislado Pandy, for repeat C/S and BTL. Routine MD pre-op orders    Nigel Bridgeman 11/10/2011, 5:35 AM

## 2011-11-11 ENCOUNTER — Encounter (HOSPITAL_COMMUNITY): Payer: Self-pay | Admitting: Anesthesiology

## 2011-11-11 ENCOUNTER — Inpatient Hospital Stay (HOSPITAL_COMMUNITY)
Admission: RE | Admit: 2011-11-11 | Discharge: 2011-11-14 | DRG: 766 | Disposition: A | Discharge status: Home / Self Care | Payer: 59 | Source: Ambulatory Visit | Attending: Obstetrics and Gynecology | Admitting: Obstetrics and Gynecology

## 2011-11-11 ENCOUNTER — Encounter (HOSPITAL_COMMUNITY)
Admission: RE | Disposition: A | Discharge status: Home / Self Care | Payer: Self-pay | Source: Ambulatory Visit | Attending: Obstetrics and Gynecology

## 2011-11-11 ENCOUNTER — Inpatient Hospital Stay (HOSPITAL_COMMUNITY): Payer: 59 | Admitting: Anesthesiology

## 2011-11-11 ENCOUNTER — Encounter (HOSPITAL_COMMUNITY): Payer: Self-pay | Admitting: *Deleted

## 2011-11-11 ENCOUNTER — Encounter (HOSPITAL_COMMUNITY): Payer: Self-pay

## 2011-11-11 DIAGNOSIS — Z302 Encounter for sterilization: Secondary | ICD-10-CM

## 2011-11-11 DIAGNOSIS — O34219 Maternal care for unspecified type scar from previous cesarean delivery: Principal | ICD-10-CM | POA: Diagnosis present

## 2011-11-11 DIAGNOSIS — Z98891 History of uterine scar from previous surgery: Secondary | ICD-10-CM | POA: Diagnosis not present

## 2011-11-11 DIAGNOSIS — O09529 Supervision of elderly multigravida, unspecified trimester: Secondary | ICD-10-CM | POA: Diagnosis present

## 2011-11-11 DIAGNOSIS — D62 Acute posthemorrhagic anemia: Secondary | ICD-10-CM | POA: Diagnosis not present

## 2011-11-11 DIAGNOSIS — O99814 Abnormal glucose complicating childbirth: Secondary | ICD-10-CM | POA: Diagnosis present

## 2011-11-11 DIAGNOSIS — O24419 Gestational diabetes mellitus in pregnancy, unspecified control: Secondary | ICD-10-CM

## 2011-11-11 LAB — RPR: RPR Ser Ql: NONREACTIVE

## 2011-11-11 SURGERY — Surgical Case
Anesthesia: Spinal | Site: Abdomen | Laterality: Bilateral | Wound class: Clean Contaminated

## 2011-11-11 MED ORDER — BUPIVACAINE HCL (PF) 0.25 % IJ SOLN
INTRAMUSCULAR | Status: DC | PRN
  Administered 2011-11-11: 20 mL

## 2011-11-11 MED ORDER — LACTATED RINGERS IV SOLN: INTRAVENOUS | Status: DC

## 2011-11-11 MED ORDER — DIPHENHYDRAMINE HCL 25 MG PO CAPS: 25.0000 mg | ORAL_CAPSULE | ORAL | Status: DC | PRN

## 2011-11-11 MED ORDER — LANOLIN HYDROUS EX OINT: 1.0000 "application " | TOPICAL_OINTMENT | CUTANEOUS | Status: DC | PRN

## 2011-11-11 MED ORDER — ZOLPIDEM TARTRATE 5 MG PO TABS: 5.0000 mg | ORAL_TABLET | Freq: Every evening | ORAL | Status: DC | PRN

## 2011-11-11 MED ORDER — LACTATED RINGERS IV SOLN
INTRAVENOUS | Status: DC | PRN
  Administered 2011-11-11: 09:00:00 via INTRAVENOUS

## 2011-11-11 MED ORDER — SCOPOLAMINE 1 MG/3DAYS TD PT72
1.0000 | MEDICATED_PATCH | Freq: Once | TRANSDERMAL | Status: DC
  Filled 2011-11-11: qty 1

## 2011-11-11 MED ORDER — EPHEDRINE 5 MG/ML INJ
INTRAVENOUS | Status: AC
  Filled 2011-11-11: qty 10

## 2011-11-11 MED ORDER — FENTANYL CITRATE 0.05 MG/ML IJ SOLN
INTRAMUSCULAR | Status: DC | PRN
  Administered 2011-11-11: 75 ug via INTRAVENOUS

## 2011-11-11 MED ORDER — SCOPOLAMINE 1 MG/3DAYS TD PT72
MEDICATED_PATCH | TRANSDERMAL | Status: AC
  Administered 2011-11-11: 1.5 mg via TRANSDERMAL
  Filled 2011-11-11: qty 1

## 2011-11-11 MED ORDER — HYDROMORPHONE HCL PF 1 MG/ML IJ SOLN
0.2500 mg | INTRAMUSCULAR | Status: DC | PRN
  Administered 2011-11-11: 0.5 mg via INTRAVENOUS

## 2011-11-11 MED ORDER — SODIUM CHLORIDE 0.9 % IJ SOLN: 9.0000 mL | INTRAMUSCULAR | Status: DC | PRN

## 2011-11-11 MED ORDER — KETOROLAC TROMETHAMINE 60 MG/2ML IM SOLN
INTRAMUSCULAR | Status: AC
  Administered 2011-11-11: 60 mg via INTRAMUSCULAR
  Filled 2011-11-11: qty 2

## 2011-11-11 MED ORDER — NALBUPHINE HCL 10 MG/ML IJ SOLN
5.0000 mg | INTRAMUSCULAR | Status: DC | PRN
  Filled 2011-11-11: qty 1

## 2011-11-11 MED ORDER — DIPHENHYDRAMINE HCL 50 MG/ML IJ SOLN: 25.0000 mg | INTRAMUSCULAR | Status: DC | PRN

## 2011-11-11 MED ORDER — EPHEDRINE SULFATE 50 MG/ML IJ SOLN
INTRAMUSCULAR | Status: DC | PRN
  Administered 2011-11-11 (×4): 10 mg via INTRAVENOUS

## 2011-11-11 MED ORDER — SENNOSIDES-DOCUSATE SODIUM 8.6-50 MG PO TABS
2.0000 | ORAL_TABLET | Freq: Every day | ORAL | Status: DC
  Administered 2011-11-11 – 2011-11-13 (×3): 2 via ORAL

## 2011-11-11 MED ORDER — METOCLOPRAMIDE HCL 5 MG/ML IJ SOLN: 10.0000 mg | Freq: Three times a day (TID) | INTRAMUSCULAR | Status: DC | PRN

## 2011-11-11 MED ORDER — SODIUM CHLORIDE 0.9 % IJ SOLN: 3.0000 mL | INTRAMUSCULAR | Status: DC | PRN

## 2011-11-11 MED ORDER — MORPHINE SULFATE 0.5 MG/ML IJ SOLN
INTRAMUSCULAR | Status: AC
  Filled 2011-11-11: qty 10

## 2011-11-11 MED ORDER — PRENATAL MULTIVITAMIN CH: 1.0000 | ORAL_TABLET | Freq: Every day | ORAL | Status: DC

## 2011-11-11 MED ORDER — IBUPROFEN 600 MG PO TABS
600.0000 mg | ORAL_TABLET | Freq: Four times a day (QID) | ORAL | Status: DC
  Administered 2011-11-12 – 2011-11-14 (×11): 600 mg via ORAL
  Filled 2011-11-11 (×11): qty 1

## 2011-11-11 MED ORDER — SIMETHICONE 80 MG PO CHEW
80.0000 mg | CHEWABLE_TABLET | Freq: Three times a day (TID) | ORAL | Status: DC
  Administered 2011-11-11 – 2011-11-14 (×8): 80 mg via ORAL

## 2011-11-11 MED ORDER — LACTATED RINGERS IV SOLN
INTRAVENOUS | Status: DC
  Administered 2011-11-12 (×2): via INTRAVENOUS

## 2011-11-11 MED ORDER — PHENYLEPHRINE 40 MCG/ML (10ML) SYRINGE FOR IV PUSH (FOR BLOOD PRESSURE SUPPORT)
PREFILLED_SYRINGE | INTRAVENOUS | Status: AC
  Filled 2011-11-11: qty 5

## 2011-11-11 MED ORDER — CEFAZOLIN SODIUM-DEXTROSE 2-3 GM-% IV SOLR: 2.0000 g | INTRAVENOUS | Status: DC

## 2011-11-11 MED ORDER — OXYTOCIN 10 UNIT/ML IJ SOLN
INTRAMUSCULAR | Status: AC
  Filled 2011-11-11: qty 4

## 2011-11-11 MED ORDER — KETOROLAC TROMETHAMINE 30 MG/ML IJ SOLN: 30.0000 mg | Freq: Four times a day (QID) | INTRAMUSCULAR | Status: AC | PRN

## 2011-11-11 MED ORDER — ONDANSETRON HCL 4 MG/2ML IJ SOLN
INTRAMUSCULAR | Status: AC
  Filled 2011-11-11: qty 2

## 2011-11-11 MED ORDER — PHENYLEPHRINE HCL 10 MG/ML IJ SOLN
INTRAMUSCULAR | Status: DC | PRN
  Administered 2011-11-11 (×11): 40 ug via INTRAVENOUS
  Administered 2011-11-11: 80 ug via INTRAVENOUS
  Administered 2011-11-11 (×2): 40 ug via INTRAVENOUS

## 2011-11-11 MED ORDER — IBUPROFEN 600 MG PO TABS: 600.0000 mg | ORAL_TABLET | Freq: Four times a day (QID) | ORAL | Status: DC | PRN

## 2011-11-11 MED ORDER — MENTHOL 3 MG MT LOZG: 1.0000 | LOZENGE | OROMUCOSAL | Status: DC | PRN

## 2011-11-11 MED ORDER — SODIUM CHLORIDE 0.9 % IV SOLN
1.0000 ug/kg/h | INTRAVENOUS | Status: DC | PRN
  Filled 2011-11-11: qty 2.5

## 2011-11-11 MED ORDER — PRENATAL MULTIVITAMIN CH
1.0000 | ORAL_TABLET | Freq: Every day | ORAL | Status: DC
  Administered 2011-11-12 – 2011-11-14 (×3): 1 via ORAL
  Filled 2011-11-11 (×3): qty 1

## 2011-11-11 MED ORDER — LACTATED RINGERS IV BOLUS (SEPSIS)
500.0000 mL | Freq: Once | INTRAVENOUS | Status: AC
  Administered 2011-11-11: 500 mL via INTRAVENOUS

## 2011-11-11 MED ORDER — ONDANSETRON HCL 4 MG/2ML IJ SOLN: 4.0000 mg | INTRAMUSCULAR | Status: DC | PRN

## 2011-11-11 MED ORDER — DIPHENHYDRAMINE HCL 12.5 MG/5ML PO ELIX
12.5000 mg | ORAL_SOLUTION | Freq: Four times a day (QID) | ORAL | Status: DC | PRN
  Filled 2011-11-11: qty 5

## 2011-11-11 MED ORDER — LACTATED RINGERS IV SOLN
Freq: Once | INTRAVENOUS | Status: AC
  Administered 2011-11-11: 08:00:00 via INTRAVENOUS

## 2011-11-11 MED ORDER — MORPHINE SULFATE (PF) 0.5 MG/ML IJ SOLN
INTRAMUSCULAR | Status: DC | PRN
  Administered 2011-11-11: 2000 ug via INTRAVENOUS
  Administered 2011-11-11: 2850 ug via EPIDURAL

## 2011-11-11 MED ORDER — TETANUS-DIPHTH-ACELL PERTUSSIS 5-2.5-18.5 LF-MCG/0.5 IM SUSP: 0.5000 mL | Freq: Once | INTRAMUSCULAR | Status: DC

## 2011-11-11 MED ORDER — HYDROMORPHONE HCL PF 1 MG/ML IJ SOLN
INTRAMUSCULAR | Status: AC
  Administered 2011-11-11: 0.5 mg via INTRAVENOUS
  Filled 2011-11-11: qty 1

## 2011-11-11 MED ORDER — FENTANYL CITRATE 0.05 MG/ML IJ SOLN
INTRAMUSCULAR | Status: AC
  Filled 2011-11-11: qty 2

## 2011-11-11 MED ORDER — SCOPOLAMINE 1 MG/3DAYS TD PT72
1.0000 | MEDICATED_PATCH | Freq: Once | TRANSDERMAL | Status: DC
  Administered 2011-11-11: 1.5 mg via TRANSDERMAL

## 2011-11-11 MED ORDER — OXYCODONE-ACETAMINOPHEN 5-325 MG PO TABS
1.0000 | ORAL_TABLET | ORAL | Status: DC | PRN
  Administered 2011-11-13: 1 via ORAL
  Administered 2011-11-13: 2 via ORAL
  Administered 2011-11-13 (×2): 1 via ORAL
  Administered 2011-11-14: 2 via ORAL
  Administered 2011-11-14: 1 via ORAL
  Filled 2011-11-11 (×5): qty 1
  Filled 2011-11-11: qty 2

## 2011-11-11 MED ORDER — NALOXONE HCL 0.4 MG/ML IJ SOLN: 0.4000 mg | INTRAMUSCULAR | Status: DC | PRN

## 2011-11-11 MED ORDER — MEASLES, MUMPS & RUBELLA VAC ~~LOC~~ INJ: 0.5000 mL | INJECTION | Freq: Once | SUBCUTANEOUS | Status: DC

## 2011-11-11 MED ORDER — HYDROMORPHONE 0.3 MG/ML IV SOLN
INTRAVENOUS | Status: DC
  Administered 2011-11-11: 0.6 mg via INTRAVENOUS
  Administered 2011-11-11: 13:00:00 via INTRAVENOUS
  Administered 2011-11-12: 0.6 mg via INTRAVENOUS
  Filled 2011-11-11: qty 25

## 2011-11-11 MED ORDER — DIPHENHYDRAMINE HCL 25 MG PO CAPS: 25.0000 mg | ORAL_CAPSULE | Freq: Four times a day (QID) | ORAL | Status: DC | PRN

## 2011-11-11 MED ORDER — DIPHENHYDRAMINE HCL 50 MG/ML IJ SOLN: 12.5000 mg | INTRAMUSCULAR | Status: DC | PRN

## 2011-11-11 MED ORDER — KETOROLAC TROMETHAMINE 60 MG/2ML IM SOLN
60.0000 mg | Freq: Once | INTRAMUSCULAR | Status: AC | PRN
  Administered 2011-11-11: 60 mg via INTRAMUSCULAR

## 2011-11-11 MED ORDER — CEFAZOLIN SODIUM-DEXTROSE 2-3 GM-% IV SOLR
INTRAVENOUS | Status: AC
  Administered 2011-11-11: 2 g via INTRAVENOUS
  Filled 2011-11-11: qty 50

## 2011-11-11 MED ORDER — FERROUS SULFATE 325 (65 FE) MG PO TABS
325.0000 mg | ORAL_TABLET | Freq: Two times a day (BID) | ORAL | Status: DC
  Administered 2011-11-12 – 2011-11-14 (×4): 325 mg via ORAL
  Filled 2011-11-11 (×4): qty 1

## 2011-11-11 MED ORDER — OXYTOCIN 40 UNITS IN LACTATED RINGERS INFUSION - SIMPLE MED
62.5000 mL/h | INTRAVENOUS | Status: DC
  Administered 2011-11-11: 62.5 mL/h via INTRAVENOUS
  Filled 2011-11-11: qty 1000

## 2011-11-11 MED ORDER — METHYLERGONOVINE MALEATE 0.2 MG PO TABS: 0.2000 mg | ORAL_TABLET | ORAL | Status: DC | PRN

## 2011-11-11 MED ORDER — SIMETHICONE 80 MG PO CHEW
80.0000 mg | CHEWABLE_TABLET | ORAL | Status: DC | PRN
  Administered 2011-11-12: 80 mg via ORAL

## 2011-11-11 MED ORDER — MEPERIDINE HCL 25 MG/ML IJ SOLN: 6.2500 mg | INTRAMUSCULAR | Status: DC | PRN

## 2011-11-11 MED ORDER — ONDANSETRON HCL 4 MG PO TABS: 4.0000 mg | ORAL_TABLET | ORAL | Status: DC | PRN

## 2011-11-11 MED ORDER — ONDANSETRON HCL 4 MG/2ML IJ SOLN
INTRAMUSCULAR | Status: DC | PRN
  Administered 2011-11-11: 4 mg via INTRAVENOUS

## 2011-11-11 MED ORDER — LACTATED RINGERS IV SOLN
INTRAVENOUS | Status: DC | PRN
  Administered 2011-11-11 (×4): via INTRAVENOUS

## 2011-11-11 MED ORDER — WITCH HAZEL-GLYCERIN EX PADS: 1.0000 "application " | MEDICATED_PAD | CUTANEOUS | Status: DC | PRN

## 2011-11-11 MED ORDER — DIPHENHYDRAMINE HCL 50 MG/ML IJ SOLN: 12.5000 mg | Freq: Four times a day (QID) | INTRAMUSCULAR | Status: DC | PRN

## 2011-11-11 MED ORDER — ONDANSETRON HCL 4 MG/2ML IJ SOLN: 4.0000 mg | Freq: Three times a day (TID) | INTRAMUSCULAR | Status: DC | PRN

## 2011-11-11 MED ORDER — DIBUCAINE 1 % RE OINT: 1.0000 "application " | TOPICAL_OINTMENT | RECTAL | Status: DC | PRN

## 2011-11-11 MED ORDER — METHYLERGONOVINE MALEATE 0.2 MG/ML IJ SOLN: 0.2000 mg | INTRAMUSCULAR | Status: DC | PRN

## 2011-11-11 MED ORDER — ONDANSETRON HCL 4 MG/2ML IJ SOLN: 4.0000 mg | Freq: Four times a day (QID) | INTRAMUSCULAR | Status: DC | PRN

## 2011-11-11 MED ORDER — OXYTOCIN 10 UNIT/ML IJ SOLN
40.0000 [IU] | INTRAVENOUS | Status: DC | PRN
  Administered 2011-11-11: 40 [IU] via INTRAVENOUS

## 2011-11-11 MED ORDER — BUPIVACAINE HCL (PF) 0.25 % IJ SOLN
INTRAMUSCULAR | Status: AC
  Filled 2011-11-11: qty 30

## 2011-11-11 SURGICAL SUPPLY — 44 items
BARRIER ADHS 3X4 INTERCEED (GAUZE/BANDAGES/DRESSINGS) ×4 IMPLANT
BENZOIN TINCTURE PRP APPL 2/3 (GAUZE/BANDAGES/DRESSINGS) ×2 IMPLANT
BOOTIES KNEE HIGH SLOAN (MISCELLANEOUS) ×4 IMPLANT
CHLORAPREP W/TINT 26ML (MISCELLANEOUS) ×2 IMPLANT
CLOTH BEACON ORANGE TIMEOUT ST (SAFETY) ×2 IMPLANT
DRAIN CHANNEL 10M FLAT 3/4 FLT (DRAIN) ×2 IMPLANT
DRAIN JACKSON PRT FLT 10 (DRAIN) ×2 IMPLANT
DRESSING TELFA 8X3 (GAUZE/BANDAGES/DRESSINGS) ×2 IMPLANT
DRSG COVADERM 4X10 (GAUZE/BANDAGES/DRESSINGS) ×4 IMPLANT
DRSG TEGADERM 4X4.75 (GAUZE/BANDAGES/DRESSINGS) ×2 IMPLANT
ELECT REM PT RETURN 9FT ADLT (ELECTROSURGICAL) ×2
ELECTRODE REM PT RTRN 9FT ADLT (ELECTROSURGICAL) ×1 IMPLANT
EVACUATOR SILICONE 100CC (DRAIN) ×4 IMPLANT
EXTRACTOR VACUUM M CUP 4 TUBE (SUCTIONS) IMPLANT
GAUZE SPONGE 4X4 12PLY STRL LF (GAUZE/BANDAGES/DRESSINGS) ×4 IMPLANT
GLOVE BIOGEL PI IND STRL 7.0 (GLOVE) ×2 IMPLANT
GLOVE BIOGEL PI INDICATOR 7.0 (GLOVE) ×2
GLOVE ECLIPSE 6.5 STRL STRAW (GLOVE) ×2 IMPLANT
GOWN PREVENTION PLUS LG XLONG (DISPOSABLE) ×6 IMPLANT
HEMOSTAT SURGICEL 2X14 (HEMOSTASIS) ×2 IMPLANT
KIT ABG SYR 3ML LUER SLIP (SYRINGE) IMPLANT
NEEDLE HYPO 22GX1.5 SAFETY (NEEDLE) ×2 IMPLANT
NEEDLE HYPO 25X5/8 SAFETYGLIDE (NEEDLE) IMPLANT
NS IRRIG 1000ML POUR BTL (IV SOLUTION) ×4 IMPLANT
PACK C SECTION WH (CUSTOM PROCEDURE TRAY) ×2 IMPLANT
PAD ABD 7.5X8 STRL (GAUZE/BANDAGES/DRESSINGS) ×2 IMPLANT
PAD OB MATERNITY 4.3X12.25 (PERSONAL CARE ITEMS) ×2 IMPLANT
RTRCTR C-SECT PINK 25CM LRG (MISCELLANEOUS) ×2 IMPLANT
SLEEVE SCD COMPRESS KNEE MED (MISCELLANEOUS) IMPLANT
SPONGE LAP 18X18 X RAY DECT (DISPOSABLE) ×6 IMPLANT
STRIP CLOSURE SKIN 1/2X4 (GAUZE/BANDAGES/DRESSINGS) ×4 IMPLANT
SUT CHROMIC GUT AB #0 18 (SUTURE) ×2 IMPLANT
SUT ETHILON 3 0 PS 1 18 (SUTURE) ×2 IMPLANT
SUT MNCRL AB 3-0 PS2 27 (SUTURE) ×2 IMPLANT
SUT SILK 2 0 FSL 18 (SUTURE) IMPLANT
SUT SILK 2 0 SH (SUTURE) ×2 IMPLANT
SUT VIC AB 0 CT1 36 (SUTURE) ×10 IMPLANT
SUT VIC AB 0 CTX 36 (SUTURE) ×7
SUT VIC AB 0 CTX36XBRD ANBCTRL (SUTURE) ×7 IMPLANT
SUT VIC AB 1 CT1 36 (SUTURE) ×8 IMPLANT
SYR 20CC LL (SYRINGE) ×2 IMPLANT
TOWEL OR 17X24 6PK STRL BLUE (TOWEL DISPOSABLE) ×4 IMPLANT
TRAY FOLEY CATH 14FR (SET/KITS/TRAYS/PACK) ×2 IMPLANT
WATER STERILE IRR 1000ML POUR (IV SOLUTION) ×2 IMPLANT

## 2011-11-11 NOTE — Addendum Note (Signed)
Addendum  created 11/11/11 1558 by Algis Greenhouse, CRNA   Modules edited:Notes Section

## 2011-11-11 NOTE — Anesthesia Postprocedure Evaluation (Signed)
Anesthesia Post Note  Patient: [name]  Procedure(s) Performed: Procedure(s) (LRB): CESAREAN SECTION WITH BILATERAL TUBAL LIGATION (Bilateral)  Anesthesia type: Spinal  Patient location: Mother/Baby  Post pain: Pain level controlled  Post assessment: Post-op Vital signs reviewed  Last Vitals:  Filed Vitals:   11/11/11 1530  BP: 94/63  Pulse: 89  Temp: 36.6 C  Resp: 14    Post vital signs: Reviewed  Level of consciousness: awake  Complications: No apparent anesthesia complications

## 2011-11-11 NOTE — Anesthesia Preprocedure Evaluation (Signed)

## 2011-11-11 NOTE — Anesthesia Procedure Notes (Signed)

## 2011-11-11 NOTE — Transfer of Care (Signed)
Immediate Anesthesia Transfer of Care Note  Patient: [name]  Procedure(s) Performed: Procedure(s) (LRB): CESAREAN SECTION WITH BILATERAL TUBAL LIGATION (Bilateral)  Patient Location: PACU  Anesthesia Type: Spinal  Level of Consciousness: awake and oriented  Airway & Oxygen Therapy: Patient Spontanous Breathing  Post-op Assessment: Report given to PACU RN and Post -op Vital signs reviewed and stable  Post vital signs: Reviewed and stable  Complications: No apparent anesthesia complications

## 2011-11-11 NOTE — Anesthesia Postprocedure Evaluation (Signed)
Anesthesia Post Note  Patient: [name]  Procedure(s) Performed: Procedure(s) (LRB): CESAREAN SECTION WITH BILATERAL TUBAL LIGATION (Bilateral)  Anesthesia type: Spinal  Patient location: PACU  Post pain: Pain level controlled  Post assessment: Post-op Vital signs reviewed  Last Vitals:  Filed Vitals:   11/11/11 1130  BP: 117/66  Pulse: 70  Temp:   Resp: 12    Post vital signs: Reviewed  Level of consciousness: awake  Complications: No apparent anesthesia complications

## 2011-11-11 NOTE — Interval H&P Note (Signed)
History and Physical Interval Note:  11/11/2011 8:37 AM  [name]  has presented today for surgery, with the diagnosis of Prior C/S; Desire for Sterilization  The various methods of treatment have been discussed with the patient and family. After consideration of risks, benefits and other options for treatment, the patient has consented to  Procedure(s) (LRB): CESAREAN SECTION WITH BILATERAL TUBAL LIGATION (Bilateral) as a surgical intervention .  The patient's history has been reviewed, patient examined, no change in status, stable for surgery.  I have reviewed the patient's chart and labs.  Questions were answered to the patient's satisfaction.     Trigo Winterbottom A

## 2011-11-11 NOTE — Op Note (Signed)
Preoperative diagnosis: Intrauterine pregnancy at 39 weeks and 3 days, 2 previous cesarean sections, desire for sterilization  Postoperative diagnosis: Same with severe pelvic adhesions  Anesthesia: Spinal  Anesthesiologist: Dr. Cristela Blue  Procedure: Repeat low transverse cesarean section with bilateral tubal ligation  Surgeon: Dr. Dois Davenport Chesley Valls  Assistant: Nigel Bridgeman certified nurse midwife  Estimated blood loss: 900 cc  Procedure:  After being informed of the planned procedure with possible complications including but not limited to bleeding, infection, injury to other organs, irreversibility of tubal ligation, failure rate of 100,000, informed consent was obtained and patient was taken to cesarean suite. She was given spinal anesthesia without complication. She was placed in the dorsal decubitus position pelvis tilted to the left, prepped and draped in the sterile fashion. A Foley catheter was inserted in her bladder.  After confirming adequate level of anesthesia, we perform a Pfannenstiel incision removing the previous scar. This incision is brought down sharply to the fascia. The fascia is incised in a low transverse fashion and linea alba is dissected. Peritoneum was entered during this dissection to realized that the uterus is completely attached to the abdominal wall on a distance of 6 cm. These are dense and thick adhesions. We proceed with systematic dissection using Bovie cauterization in order to detach the uterus from the abdominal wall. In the process part of the serosa is left on the fascia. The rest of the peritoneum is entered bluntly. The bladder is also adherent to the uterus it is easy to retract down with blunt lysis of adhesions. During that process significant bleeding occurs in the in the left angle of the lower uterine segment which is extremely thin. We then opened the myometrium in the lower uterine segment in a transverse way. This is performed initially sharply  and extended bluntly. Amniotic fluid is clear and abundant. We assist the birth of a female infant at 9:24 AM in vertex presentation. Mouth and nose are suctioned, baby is delivered, cord is clamped with 2 Kelly clamps and sectioned, and baby is given to the neonatologist present in the room. Alexis retractor is rapidly positioned and the source of the bleeding is identified and clamped. Placenta is allowed to deliver spontaneously. It is complete and the cord has 3 vessels. It is sent for cord blood donation and then to labor and delivery. Uterine revision is negative.  We proceed with closure of the low transverse hysterotomy in 2 layers: First with a running lock suture of 0 Vicryl then with a Lembert suture of 0 Vicryl imbricating the first one. Hemostasis is completed on this hysterotomy with 3 figure-of-eight stitches of 0 Vicryl. We then exteriorized the uterus from the abdominal cavity to perform systematic repair of the defect left from the previous lysis of adhesion. This is a superficial myometrial defect measuring approximately 6 cm. It is closed with multiple figure-of-eight stitches of 0 Vicryl until hemostasis is deemed adequate.  We then proceed with tubal ligation by isolating each tube with 2 Babcock forcep. Mesosalpinx was entered with cauterization. Each tube is then doubly ligated with 0 chromic on the proximal and distal stump. A section of 1.5 cm of isthmal ampullary tube is removed from both tubes. Both stumps are then cauterized and hemostasis is deemed adequate. Both ovaries are evaluated and normal. The uterus was returned into the abdominal cavity. Both paracolic gutters are cleaned. We irrigated profusely the pelvis with warm saline to note a satisfactory hemostasis.  Two sheets of Surgicel are placed on the  upper uterine defect. Two sheets of Interceed then cover both uterine defects.  We proceed with closure of the peritoneum with a running suture of 3-0 Vicryl. Under fascia  hemostasis is completed with cauterization on the fascia is closed with 2 running sutures of 1 Vicryl meeting midline.  The wound is irrigated with warm saline and hemostasis is completed with cauterization. A #10 JP drain is left in the incision with a left contra incision. It is sutured to the skin with a 0 silk. The skin is then closed with a subcuticular suture of 3-0 Ethilon for removal in 3 days. Steri-Strips are placed on the incision as well.  Instrument and sponge count is complete x2. Estimated blood loss is 900 cc. The procedure is well tolerated by the patient is taken to recovery room in a well and stable condition.  Little girl named Thom Chimes was born at 9:24 AM, received an Apgar of 8 at 1 minute and 9 at 5 minutes and weighed 8 lbs. 6 oz. She was taken to the regular nursery.  Specimen: Placenta sent to labor and delivery. 2 segments of tube sent to pathology

## 2011-11-12 LAB — CBC
Hemoglobin: 5.5 g/dL — CL (ref 12.0–15.0)
MCV: 82.8 fL (ref 78.0–100.0)
Platelets: 170 10*3/uL (ref 150–400)
RBC: 2.04 MIL/uL — ABNORMAL LOW (ref 3.87–5.11)
WBC: 18.6 10*3/uL — ABNORMAL HIGH (ref 4.0–10.5)

## 2011-11-12 LAB — CCBB MATERNAL DONOR DRAW

## 2011-11-12 MED ORDER — DIPHENHYDRAMINE HCL 25 MG PO CAPS
25.0000 mg | ORAL_CAPSULE | Freq: Once | ORAL | Status: AC
  Administered 2011-11-12: 25 mg via ORAL
  Filled 2011-11-12: qty 1

## 2011-11-12 NOTE — Progress Notes (Signed)
Subjective: Postpartum Day 1: Cesarean Delivery Patient reports tolerating PO, + flatus, + BM and no problems voiding.   She is very tired   Objective: Vital signs in last 24 hours: Temp:  [97.8 F (36.6 C)-99.4 F (37.4 C)] 97.8 F (36.6 C) (08/22 0845) Pulse Rate:  [66-125] 98  (08/22 0852) Resp:  [14-25] 18  (08/22 0845) BP: (94-118)/(59-73) 107/72 mmHg (08/22 0852) SpO2:  [97 %-99 %] 99 % (08/22 0845)  Physical Exam:  General: alert, pale Lochia: appropriate Uterine Fundus: firm Incision: no significant drainage, no significant erythema DVT Evaluation: No evidence of DVT seen on physical exam.   Basename 11/12/11 0525  HGB 5.5*  HCT 16.9*    Assessment/Plan: Status post Cesarean section. Doing well postoperatively.  Continue current care. Orthostatic blood pressures were normal but patient is very fatigued.  I offered blood and the patient accepted.  I will tranfuse three units and check a CBC in the morning.  D/c foley the pt has adequate urine  Deliah Strehlow A 11/12/2011, 2:57 PM

## 2011-11-12 NOTE — Progress Notes (Signed)
Lavera Guise, CNM notified of critical lab value. No new orders received.

## 2011-11-12 NOTE — Progress Notes (Signed)
CRITICAL VALUE ALERT  Critical value received:  Hgb : 5.5  Date of notification:  11/12/11  Time of notification:  0635  Critical value read back:yes  Nurse who received alert:  Mont Dutton, RN  MD notified (1st page):  Lavera Guise, CNM  Time of first page:  [phone]  MD notified (2nd page):  Time of second page:  Responding MD:  Elsie Saas  Time MD responded:  [phone]

## 2011-11-13 LAB — CBC
HCT: 24.6 % — ABNORMAL LOW (ref 36.0–46.0)
Hemoglobin: 8.1 g/dL — ABNORMAL LOW (ref 12.0–15.0)
MCH: 28 pg (ref 26.0–34.0)
MCHC: 32.9 g/dL (ref 30.0–36.0)
MCV: 85.1 fL (ref 78.0–100.0)
RBC: 2.89 MIL/uL — ABNORMAL LOW (ref 3.87–5.11)

## 2011-11-13 NOTE — Discharge Summary (Signed)
Obstetric Discharge Summary Reason for Admission: cesarean section, previous C/S x 2, desired BTL Prenatal Procedures: ultrasound Intrapartum Procedures: cesarean: low cervical, transverse Postpartum Procedures: transfusion 5 units PRBC (3 on day 1 postop, 2 on day 3 prior to d/c) Complications-Operative and Postpartum: Increased blood loss due to significant adhesions Hemoglobin  Date Value Range Status  11/14/2011 7.6* 12.0 - 15.0 g/dL Final     HCT  Date Value Range Status  11/14/2011 22.9* 36.0 - 46.0 % Final  Hospital Course: Patient was admitted on 11/11/11 for a scheduled repeat cesarean delivery and BTL.  She was taken to the operating room, where Dr. Estanislado Pandy performed a repeat LTCS under spinal anesthesia, with delivery of a viable female, "[name]", with weight and Apgars as listed below. Infant was in good condition and remained at the patient's bedside.  During surgery, significant adhesions were noted, with several areas on the uterus that required repair.  She also had removal of a keloid from her previous incision site, with a non-dissolvable suture placed for skin closure (in an effort to decrease the possibility of another keloid). This was removed on day 3 without difficulty.  She had a JP drain placed.  She also had steristrips applied that were to remain for 2 weeks, then could be removed by the patient.  Bleeding was controlled during the surgery, but the patient did have a significant postoperative drop in her pre-delivery of 10.6 to 5.5 on day 1 post-op.  She was transfused on day 1 with 3 units PRBC, with Hgb to 8.1.  Patient planned to breast feed.  On post-op day 2, patient was doing well post transfusion, tolerating a regular diet, and up ad lib..  Throughout her stay, her physical exam was WNL, her incision was CDI, and her vital signs remained stable.  She had a JP drain that was removed on day 3, without difficulty.  On post-op day 3, she was counseled regarding her Hgb of 8.1,  with discussion of option for further transfusions if she was still not feeling as well as she would have hoped.  After discussing with her husband, the patient desired to proceed with additional transfusion of 2 more units of PRBC.  She had a CBC done 2 hours post transfusion--see chart for final results.  She was sent home in good condition after completing the transfusion.    Physical Exam:  General: alert Lochia: appropriate Uterine Fundus: firm Incision: healing well DVT Evaluation: No evidence of DVT seen on physical exam. Negative Homan's sign.  Discharge Diagnoses: Term Pregnancy-delivered and repeat C/S x 3, with BTL, severe anemia, with transfusion of 3 u PRBC.  Discharge Information: Date: 11/14/2011 Activity: Per [facility] handout Diet: routine Medications: Ibuprofen, Percocet and iron Condition: stable Instructions: refer to practice specific booklet Discharge to: home Contraception:  BTL Follow-up Information    Follow up with [facility] in 6 weeks. (Call to schedule postpartum appointment if not already scheduled.  Call with any questions or concerns)          Newborn Data: Live born female  Birth Weight: 8 lb 6 oz (3800 g) APGAR: 8, 9  Home with mother.  Nigel Bridgeman 11/14/2011, 8:50 AM

## 2011-11-13 NOTE — Progress Notes (Addendum)
Subjective: Postpartum Day 2 Cesarean Delivery and BTL Patient reports tolerating PO, + flatus, + BM and no problems voiding.   Objective:  Hemoglobin & Hematocrit     Component Value Date/Time   HGB 8.1* 11/13/2011 0530   HCT 24.6* 11/13/2011 0530  Vital signs in last 24 hours: BP 116/80  Pulse 87  Temp 97.9 F (36.6 C) (Oral)  Resp 18  Ht 5\' 4"  (1.626 m)  Wt 142 lb (64.411 kg)  BMI 24.37 kg/m2  SpO2 99%  LMP 01/30/2011  Breastfeeding? Unknown  Physical Exam:  General: alert, cooperative and no distress Lochia: appropriate Uterine Fundus: firm Incision: with steri strips well approximated no redness, edema, or drainage DVT Evaluation: Negative Homan's sign. No significant calf/ankle edema. Lungs clear bilaterally AP RRR Bowel sounds active abd soft, still tympanic more tenderness to the R. S: comfortable, little bleeding, slept     breasteeding     A normal involution     Lactating     PO day 2     Normal involution P continue care Lavera Guise, CNM

## 2011-11-14 DIAGNOSIS — D62 Acute posthemorrhagic anemia: Secondary | ICD-10-CM | POA: Diagnosis not present

## 2011-11-14 LAB — CBC
HCT: 22.9 % — ABNORMAL LOW (ref 36.0–46.0)
MCH: 28.3 pg (ref 26.0–34.0)
MCH: 29.3 pg (ref 26.0–34.0)
MCHC: 33.2 g/dL (ref 30.0–36.0)
MCHC: 34.5 g/dL (ref 30.0–36.0)
MCV: 85.1 fL (ref 78.0–100.0)
MCV: 84.9 fL (ref 78.0–100.0)
Platelets: 178 10*3/uL (ref 150–400)
Platelets: 222 10*3/uL (ref 150–400)
RDW: 16.3 % — ABNORMAL HIGH (ref 11.5–15.5)

## 2011-11-14 MED ORDER — OXYCODONE-ACETAMINOPHEN 5-325 MG PO TABS: 1.0000 | ORAL_TABLET | ORAL | Status: AC | PRN

## 2011-11-14 MED ORDER — DIPHENHYDRAMINE HCL 25 MG PO CAPS
25.0000 mg | ORAL_CAPSULE | Freq: Once | ORAL | Status: AC
  Administered 2011-11-14: 25 mg via ORAL
  Filled 2011-11-14: qty 1

## 2011-11-14 MED ORDER — LACTATED RINGERS IV SOLN
INTRAVENOUS | Status: DC
  Administered 2011-11-14: 08:00:00 via INTRAVENOUS

## 2011-11-14 MED ORDER — IBUPROFEN 600 MG PO TABS: 600.0000 mg | ORAL_TABLET | Freq: Four times a day (QID) | ORAL | Status: AC | PRN

## 2011-11-14 MED ORDER — ACETAMINOPHEN 325 MG PO TABS
650.0000 mg | ORAL_TABLET | Freq: Once | ORAL | Status: AC
  Administered 2011-11-14: 650 mg via ORAL
  Filled 2011-11-14: qty 2

## 2011-11-14 MED ORDER — FERROUS SULFATE 325 (65 FE) MG PO TABS: 325.0000 mg | ORAL_TABLET | Freq: Every day | ORAL | Status: DC

## 2011-11-15 LAB — TYPE AND SCREEN
ABO/RH(D): O POS
Unit division: 0
Unit division: 0
Unit division: 0

## 2011-12-21 ENCOUNTER — Ambulatory Visit: Payer: 59 | Admitting: Obstetrics and Gynecology

## 2011-12-24 ENCOUNTER — Encounter: Payer: Self-pay | Admitting: Obstetrics and Gynecology

## 2011-12-24 ENCOUNTER — Ambulatory Visit (INDEPENDENT_AMBULATORY_CARE_PROVIDER_SITE_OTHER): Payer: 59 | Admitting: Obstetrics and Gynecology

## 2011-12-24 NOTE — Progress Notes (Signed)
[name]  is 6 weeks postpartum following a primary cesarean section, low transverse incision at 39 w  gestational weeks  Date: 11/11/2011 female baby named [name]  delivered by Dr.Dorna Mallet.  Breastfeeding: yes Bottlefeeding:  yes  Post-partum blues / depression:  no  EPDS score: 0  History of abnormal Pap:  no  Last Pap: Date  03/2011 Gestational diabetes:  no  Contraception: Satisfied with bilateral tubal ligation  Normal urinary function:  yes Normal GI function:  yes Returning to work:  Yes 01/11/2012 Pt stated no issues today.  Subjective:     [name] is a 36 y.o. female who presents for a postpartum visit.  I have fully reviewed the prenatal and intrapartum course.     The following portions of the patient's history were reviewed and updated as appropriate: allergies, current medications, past family history, past medical history, past social history, past surgical history and problem list.  Review of Systems Pertinent items are noted in HPI.   Objective:    BP 110/80  Wt 122 lb (55.339 kg)  Breastfeeding? Yes  General:  alert, cooperative and no distress     Lungs: clear to auscultation bilaterally  Heart:  regular rate and rhythm, S1, S2 normal, no murmur  Abdomen: soft, non-tender; bowel sounds normal; no masses,  no organomegaly  Incision well healed   Vulva:  normal  Vagina: normal vagina  Cervix:  normal  Corpus: normal size, contour, position, consistency, mobility, non-tender  Adnexa:  normal adnexa             Assessment:     Normal postpartum exam.   Plan:   AEX 03/2012  Natalie Lay MD 12/24/2011 4:27 PM

## 2012-03-28 ENCOUNTER — Ambulatory Visit: Payer: 59 | Admitting: Internal Medicine

## 2012-04-04 ENCOUNTER — Ambulatory Visit: Payer: 59 | Admitting: Internal Medicine

## 2012-04-08 ENCOUNTER — Encounter: Payer: Self-pay | Admitting: Internal Medicine

## 2012-04-08 ENCOUNTER — Ambulatory Visit (INDEPENDENT_AMBULATORY_CARE_PROVIDER_SITE_OTHER): Payer: 59 | Admitting: Internal Medicine

## 2012-04-08 ENCOUNTER — Ambulatory Visit: Payer: 59 | Admitting: Internal Medicine

## 2012-04-08 VITALS — BP 102/74 | Temp 98.0°F | Wt 119.0 lb

## 2012-04-08 DIAGNOSIS — R42 Dizziness and giddiness: Secondary | ICD-10-CM

## 2012-04-08 LAB — BASIC METABOLIC PANEL
Calcium: 9.1 mg/dL (ref 8.4–10.5)
Creatinine, Ser: 0.6 mg/dL (ref 0.4–1.2)
GFR: 111.74 mL/min (ref >60.00)
Sodium: 139 mEq/L (ref 135–145)

## 2012-04-08 LAB — CBC WITH DIFFERENTIAL/PLATELET
Basophils Absolute: 0 10*3/uL (ref 0.0–0.1)
Basophils Relative: 0.7 % (ref 0.0–3.0)
Eosinophils Absolute: 0.2 10*3/uL (ref 0.0–0.7)
Hemoglobin: 12.1 g/dL (ref 12.0–15.0)
Lymphocytes Relative: 33.7 % (ref 12.0–46.0)
MCHC: 33.8 g/dL (ref 30.0–36.0)
Monocytes Relative: 7.3 % (ref 3.0–12.0)
Neutro Abs: 4 10*3/uL (ref 1.4–7.7)
Neutrophils Relative %: 55.9 % (ref 43.0–77.0)
RBC: 3.97 Mil/uL (ref 3.87–5.11)
RDW: 14.3 % (ref 11.5–14.6)

## 2012-04-08 LAB — T4, FREE: Free T4: 0.95 ng/dL (ref 0.60–1.60)

## 2012-04-08 NOTE — Progress Notes (Signed)
Subjective:    Patient ID: [name], female    DOB: 1976-03-11, 37 y.o.   MRN: [id number]  HPI  37 year old Asian female complains of dizziness/lightheadedness for last 5 months. Her symptoms are not related to changes in head position. Symptoms can be precipitated after prolonged sitting then standing. She has intermittent palpitations but not related to her dizziness. She denies shortness of breath but complains of fatigue. If she lifts anything heavy, patient can experience chest tightness.  Patient gave birth to her third child in August of 2013. She had severe bleeding with her recent pregnancy. Her hemoglobin dropped to 5. She was transfused 2 units. Patient reports taking iron supplement daily.  Review of Systems See  HPI    Past Medical History  Diagnosis Date  . History of chicken pox   . History of measles, mumps, or rubella   . History of gestational diabetes   . No pertinent past medical history     History   Social History  . Marital Status: Married    Spouse Name: N/A    Number of Children: N/A  . Years of Education: N/A   Occupational History  . Not on file.   Social History Main Topics  . Smoking status: Never Smoker   . Smokeless tobacco: Never Used  . Alcohol Use: No  . Drug Use: No  . Sexually Active: Yes    Birth Control/ Protection: Abstinence, Surgical     Comment: BTL    Other Topics Concern  . Not on file   Social History Narrative  . No narrative on file    Past Surgical History  Procedure Date  . Cesarean section   . Breast cyst excision   . Tubal ligation     Family History  Problem Relation Age of Onset  . Cancer Mother     cervical  . Arthritis    . Diabetes    . Hyperlipidemia    . Hypertension    . Coronary artery disease Maternal Grandfather   . Asthma Brother   . Cancer Maternal Aunt     colon    No Known Allergies  Current Outpatient Prescriptions on File Prior to Visit  Medication Sig Dispense Refill    . ferrous sulfate 325 (65 FE) MG tablet Take 1 tablet (325 mg total) by mouth daily with breakfast.  30 tablet  1    BP 102/74  Temp 98 F (36.7 C) (Oral)  Wt 119 lb (53.978 kg)  EKG shows normal sinus rhythm at 75 beats per minute. Occasional PAC  Objective:   Physical Exam  Constitutional: She is oriented to person, place, and time. She appears well-developed and well-nourished.  HENT:  Head: Normocephalic and atraumatic.  Right Ear: External ear normal.  Left Ear: External ear normal.  Mouth/Throat: Oropharynx is clear and moist.  Eyes: Conjunctivae normal and EOM are normal. Pupils are equal, round, and reactive to light.       Pink conjunctiva  Neck: Neck supple.  Cardiovascular: Regular rhythm, normal heart sounds and intact distal pulses.   No murmur heard. Pulmonary/Chest: Effort normal and breath sounds normal. She has no wheezes.  Abdominal: Soft. Bowel sounds are normal. There is no tenderness.  Musculoskeletal: She exhibits no edema.  Lymphadenopathy:    She has no cervical adenopathy.  Neurological: She is alert and oriented to person, place, and time. No cranial nerve deficit.          Assessment & Plan:

## 2012-04-08 NOTE — Assessment & Plan Note (Signed)
37 year old Asian female complains of dizziness for last 5 months. I doubt symptoms secondary to anemia. She did have significant blood loss during her pregnancy in August of 2013. Check CBC and thyroid functions. If testing normal and the patient has persistent symptoms, we discussed obtaining 2-D echocardiogram. Rule out cardiomyopathy related to pregnancy.

## 2012-04-12 ENCOUNTER — Other Ambulatory Visit: Payer: Self-pay | Admitting: Internal Medicine

## 2012-04-12 DIAGNOSIS — R0602 Shortness of breath: Secondary | ICD-10-CM

## 2012-04-12 DIAGNOSIS — R42 Dizziness and giddiness: Secondary | ICD-10-CM

## 2012-04-19 ENCOUNTER — Other Ambulatory Visit (HOSPITAL_COMMUNITY): Payer: 59

## 2012-04-22 ENCOUNTER — Other Ambulatory Visit (HOSPITAL_COMMUNITY): Payer: 59

## 2012-04-22 ENCOUNTER — Ambulatory Visit (HOSPITAL_COMMUNITY): Payer: 59 | Attending: Cardiology | Admitting: Radiology

## 2012-04-22 DIAGNOSIS — R0989 Other specified symptoms and signs involving the circulatory and respiratory systems: Secondary | ICD-10-CM | POA: Insufficient documentation

## 2012-04-22 DIAGNOSIS — R42 Dizziness and giddiness: Secondary | ICD-10-CM | POA: Insufficient documentation

## 2012-04-22 DIAGNOSIS — R0602 Shortness of breath: Secondary | ICD-10-CM

## 2012-04-22 DIAGNOSIS — R002 Palpitations: Secondary | ICD-10-CM | POA: Insufficient documentation

## 2012-04-22 DIAGNOSIS — R0609 Other forms of dyspnea: Secondary | ICD-10-CM | POA: Insufficient documentation

## 2012-04-22 NOTE — Progress Notes (Signed)
Echocardiogram performed.  

## 2012-05-17 ENCOUNTER — Encounter: Payer: Self-pay | Admitting: Internal Medicine

## 2012-05-17 ENCOUNTER — Ambulatory Visit (INDEPENDENT_AMBULATORY_CARE_PROVIDER_SITE_OTHER): Payer: 59 | Admitting: Internal Medicine

## 2012-05-17 VITALS — BP 114/80 | HR 76 | Temp 98.4°F | Wt 120.0 lb

## 2012-05-17 DIAGNOSIS — R42 Dizziness and giddiness: Secondary | ICD-10-CM

## 2012-05-17 DIAGNOSIS — R7989 Other specified abnormal findings of blood chemistry: Secondary | ICD-10-CM

## 2012-05-17 NOTE — Assessment & Plan Note (Signed)
Patient's CBC and 2-D echocardiogram were normal. Unclear whether her symptoms are secondary to stress. She declines use of zolpidem 5 mg as needed. She works 3 overnight shifts. Her sleep quality is poor.   Patient advised to followup if her fatigue and dizziness worsens.

## 2012-05-17 NOTE — Progress Notes (Signed)
  Subjective:    Patient ID: [name], female    DOB: 05-23-1975, 37 y.o.   MRN: [id number]  HPI  37 year old Asian female for followup regarding symptoms of intermittent dizziness and lightheadedness.  She also complained of intermittent fatigue. She experienced mild chest tightness with lifting heavy objects. Her CBC was unremarkable. She is not anemic. Her symptoms started after her last childbirth. 2-D echocardiogram was ordered to rule out postpartum cardiomyopathy. Echo showed normal EF and systolic function.  Patient reports symptoms have somewhat improved. She still has intermittent fatigue and lightheadedness. She wonders if her symptoms of are stress related. 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   Review of Systems During overnight shifts she sleeps 5 hours during the day  Past Medical History  Diagnosis Date  . History of chicken pox   . History of measles, mumps, or rubella   . History of gestational diabetes   . No pertinent past medical history     History   Social History  . Marital Status: Married    Spouse Name: N/A    Number of Children: N/A  . Years of Education: N/A   Occupational History  . Not on file.   Social History Main Topics  . Smoking status: Never Smoker   . Smokeless tobacco: Never Used  . Alcohol Use: No  . Drug Use: No  . Sexually Active: Yes    Birth Control/ Protection: Abstinence, Surgical     Comment: BTL    Other Topics Concern  . Not on file   Social History Narrative  . No narrative on file    Past Surgical History  Procedure Laterality Date  . Cesarean section    . Breast cyst excision    . Tubal ligation      Family History  Problem Relation Age of Onset  . Cancer Mother     cervical  . Arthritis    . Diabetes    . Hyperlipidemia    . Hypertension    . Coronary artery disease Maternal Grandfather   . Asthma Brother   . Cancer Maternal Aunt     colon    No Known Allergies  Current  Outpatient Prescriptions on File Prior to Visit  Medication Sig Dispense Refill  . ferrous sulfate 325 (65 FE) MG tablet Take 1 tablet (325 mg total) by mouth daily with breakfast.  30 tablet  1   No current facility-administered medications on file prior to visit.    BP 114/80  Pulse 76  Temp(Src) 98.4 F (36.9 C) (Oral)  Wt 120 lb (54.432 kg)  BMI 20.59 kg/m2       Objective:   Physical Exam  Constitutional: She is oriented to person, place, and time. She appears well-developed and well-nourished.  Cardiovascular: Normal rate, regular rhythm and normal heart sounds.   Pulmonary/Chest: Effort normal and breath sounds normal. She has no wheezes.  Musculoskeletal: She exhibits no edema.  Neurological: She is alert and oriented to person, place, and time. No cranial nerve deficit.  Psychiatric: She has a normal mood and affect. Her behavior is normal.          Assessment & Plan:

## 2012-05-17 NOTE — Patient Instructions (Addendum)
Please return in one month for thyroid blood tests.

## 2012-05-18 ENCOUNTER — Ambulatory Visit: Payer: 59 | Admitting: Obstetrics and Gynecology

## 2012-05-18 ENCOUNTER — Ambulatory Visit: Payer: 59 | Admitting: Internal Medicine

## 2012-11-08 ENCOUNTER — Other Ambulatory Visit: Payer: Self-pay | Admitting: Occupational Medicine

## 2012-11-08 ENCOUNTER — Ambulatory Visit: Payer: Self-pay

## 2012-11-08 DIAGNOSIS — R7612 Nonspecific reaction to cell mediated immunity measurement of gamma interferon antigen response without active tuberculosis: Secondary | ICD-10-CM

## 2013-01-19 ENCOUNTER — Encounter: Payer: Self-pay | Admitting: Family Medicine

## 2013-01-19 ENCOUNTER — Ambulatory Visit (INDEPENDENT_AMBULATORY_CARE_PROVIDER_SITE_OTHER): Payer: 59 | Admitting: Family Medicine

## 2013-01-19 VITALS — BP 104/68 | HR 85 | Temp 98.7°F | Wt 121.0 lb

## 2013-01-19 DIAGNOSIS — J019 Acute sinusitis, unspecified: Secondary | ICD-10-CM

## 2013-01-19 MED ORDER — AZITHROMYCIN 250 MG PO TABS: ORAL_TABLET | ORAL | Status: DC

## 2013-01-19 NOTE — Progress Notes (Signed)
  Subjective:    Patient ID: [name], female    DOB: 1975-07-19, 37 y.o.   MRN: [id number]  HPI Here for 3 days of left ear pain, sinus pressure, PND, ST , and a dry cough. She tried Zyrtec and Tylenol.    Review of Systems  Constitutional: Negative.   HENT: Positive for congestion, postnasal drip and sinus pressure.   Eyes: Negative.   Respiratory: Positive for cough.        Objective:   Physical Exam  Constitutional: She appears well-developed and well-nourished.  HENT:  Right Ear: External ear normal.  Left Ear: External ear normal.  Nose: Nose normal.  Mouth/Throat: Oropharynx is clear and moist.  Eyes: Conjunctivae are normal.  Pulmonary/Chest: Effort normal and breath sounds normal.  Lymphadenopathy:    She has no cervical adenopathy.          Assessment & Plan:  Add Mucinex

## 2013-03-23 LAB — HM PAP SMEAR: HM Pap smear: NORMAL

## 2014-01-22 ENCOUNTER — Encounter: Payer: Self-pay | Admitting: Family Medicine

## 2014-07-19 ENCOUNTER — Other Ambulatory Visit (INDEPENDENT_AMBULATORY_CARE_PROVIDER_SITE_OTHER): Payer: 59

## 2014-07-19 DIAGNOSIS — Z0000 Encounter for general adult medical examination without abnormal findings: Secondary | ICD-10-CM

## 2014-07-19 LAB — BASIC METABOLIC PANEL
BUN: 10 mg/dL (ref 6–23)
CALCIUM: 9.6 mg/dL (ref 8.4–10.5)
CO2: 28 mEq/L (ref 19–32)
Chloride: 103 mEq/L (ref 96–112)
Creatinine, Ser: 0.69 mg/dL (ref 0.40–1.20)
GFR: 101.17 mL/min (ref >60.00)
Glucose, Bld: 89 mg/dL (ref 70–99)
POTASSIUM: 4.4 meq/L (ref 3.5–5.1)
Sodium: 138 mEq/L (ref 135–145)

## 2014-07-19 LAB — CBC WITH DIFFERENTIAL/PLATELET
BASOS ABS: 0.1 10*3/uL (ref 0.0–0.1)
Basophils Relative: 0.5 % (ref 0.0–3.0)
EOS ABS: 0.3 10*3/uL (ref 0.0–0.7)
Eosinophils Relative: 2.6 % (ref 0.0–5.0)
HEMATOCRIT: 36.9 % (ref 36.0–46.0)
HEMOGLOBIN: 12.4 g/dL (ref 12.0–15.0)
LYMPHS ABS: 3.4 10*3/uL (ref 0.7–4.0)
LYMPHS PCT: 32.6 % (ref 12.0–46.0)
MCHC: 33.5 g/dL (ref 30.0–36.0)
MCV: 88.3 fl (ref 78.0–100.0)
Monocytes Absolute: 0.6 10*3/uL (ref 0.1–1.0)
Monocytes Relative: 5.7 % (ref 3.0–12.0)
NEUTROS ABS: 6.1 10*3/uL (ref 1.4–7.7)
Neutrophils Relative %: 58.6 % (ref 43.0–77.0)
Platelets: 220 10*3/uL (ref 150.0–400.0)
RBC: 4.18 Mil/uL (ref 3.87–5.11)
RDW: 13.1 % (ref 11.5–15.5)
WBC: 10.4 10*3/uL (ref 4.0–10.5)

## 2014-07-19 LAB — HEPATIC FUNCTION PANEL
ALT: 8 U/L (ref 0–35)
AST: 12 U/L (ref 0–37)
Albumin: 4.6 g/dL (ref 3.5–5.2)
Alkaline Phosphatase: 48 U/L (ref 39–117)
BILIRUBIN TOTAL: 0.5 mg/dL (ref 0.2–1.2)
Bilirubin, Direct: 0.1 mg/dL (ref 0.0–0.3)
TOTAL PROTEIN: 7.8 g/dL (ref 6.0–8.3)

## 2014-07-19 LAB — LIPID PANEL
CHOL/HDL RATIO: 3
CHOLESTEROL: 174 mg/dL (ref 0–200)
HDL: 54.5 mg/dL (ref >39.00)
LDL CALC: 112 mg/dL — AB (ref 0–99)
NONHDL: 119.5
TRIGLYCERIDES: 38 mg/dL (ref 0.0–149.0)
VLDL: 7.6 mg/dL (ref 0.0–40.0)

## 2014-07-19 LAB — POCT URINALYSIS DIPSTICK
Bilirubin, UA: NEGATIVE
Blood, UA: NEGATIVE
Glucose, UA: NEGATIVE
KETONES UA: NEGATIVE
LEUKOCYTES UA: NEGATIVE
Nitrite, UA: NEGATIVE
PH UA: 5
PROTEIN UA: NEGATIVE
Spec Grav, UA: >=1.03
Urobilinogen, UA: 0.2

## 2014-07-19 LAB — TSH: TSH: 1.5 u[IU]/mL (ref 0.35–4.50)

## 2014-07-20 ENCOUNTER — Other Ambulatory Visit: Payer: 59

## 2014-07-27 ENCOUNTER — Encounter: Payer: Self-pay | Admitting: Internal Medicine

## 2014-09-07 ENCOUNTER — Encounter: Payer: Self-pay | Admitting: Internal Medicine

## 2014-11-09 ENCOUNTER — Encounter: Payer: Self-pay | Admitting: Internal Medicine

## 2014-12-10 ENCOUNTER — Encounter: Payer: 59 | Admitting: Internal Medicine

## 2015-04-30 ENCOUNTER — Encounter: Payer: Self-pay | Admitting: Internal Medicine

## 2015-04-30 ENCOUNTER — Ambulatory Visit (INDEPENDENT_AMBULATORY_CARE_PROVIDER_SITE_OTHER): Payer: 59 | Admitting: Internal Medicine

## 2015-04-30 VITALS — BP 110/70 | HR 104 | Temp 99.2°F | Resp 20 | Ht 64.0 in | Wt 122.0 lb

## 2015-04-30 DIAGNOSIS — J069 Acute upper respiratory infection, unspecified: Secondary | ICD-10-CM

## 2015-04-30 NOTE — Patient Instructions (Signed)
HOME CARE INSTRUCTIONS  Drink plenty of water. Water helps thin the mucus so your sinuses can drain more easily.  Use a humidifier.  Inhale steam 3-4 times a day (for example, sit in the bathroom with the shower running).  Apply a warm, moist washcloth to your face 3-4 times a day, or as directed by your health care provider.  Use saline nasal sprays to help moisten and clean your sinuses.   Take 400-600 mg of ibuprofen ( Advil, Motrin) with food every 4 to 6 hours as needed for pain relief or control of fever, muscle aches and sore throat  Lozengers for sore throat

## 2015-04-30 NOTE — Progress Notes (Signed)
Subjective:    Patient ID: [name], female    DOB: [birth year], 40 y.o.   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  She presents with a 2 day history of sore throat, ear discomfort, low-grade fever, sinus and chest congestion.  She has low-grade fever and generalized myalgias.  Past Medical History  Diagnosis Date  . History of chicken pox   . History of measles, mumps, or rubella   . History of gestational diabetes   . No pertinent past medical history     Social History   Social History  . Marital Status: Married    Spouse Name: N/A  . Number of Children: N/A  . Years of Education: N/A   Occupational History  . Not on file.   Social History Main Topics  . Smoking status: Never Smoker   . Smokeless tobacco: Never Used  . Alcohol Use: No  . Drug Use: No  . Sexual Activity: Yes    Birth Control/ Protection: Abstinence, Surgical     Comment: BTL    Other Topics Concern  . Not on file   Social History Narrative    Past Surgical History  Procedure Laterality Date  . Cesarean section    . Breast cyst excision    . Tubal ligation      Family History  Problem Relation Age of Onset  . Cancer Mother     cervical  . Arthritis    . Diabetes    . Hyperlipidemia    . Hypertension    . Coronary artery disease Maternal Grandfather   . Asthma Brother   . Cancer Maternal Aunt     colon    No Known Allergies  No current outpatient prescriptions on file prior to visit.   No current facility-administered medications on file prior to visit.    BP 110/70 mmHg  Pulse 104  Temp(Src) 99.2 F (37.3 C) (Oral)  Resp 20  Ht 5\' 4"  (1.626 m)  Wt 122 lb (55.339 kg)  BMI 20.93 kg/m2  SpO2 98%      Review of Systems  Constitutional: Positive for activity change, appetite change and fatigue.  HENT: Positive for congestion, postnasal drip, sinus pressure and sore throat. Negative for dental problem, hearing loss,  rhinorrhea and tinnitus.   Eyes: Negative for pain, discharge and visual disturbance.  Respiratory: Positive for cough. Negative for shortness of breath.   Cardiovascular: Negative for chest pain, palpitations and leg swelling.  Gastrointestinal: Negative for nausea, vomiting, abdominal pain, diarrhea, constipation, blood in stool and abdominal distention.  Genitourinary: Negative for dysuria, urgency, frequency, hematuria, flank pain, vaginal bleeding, vaginal discharge, difficulty urinating, vaginal pain and pelvic pain.  Musculoskeletal: Negative for joint swelling, arthralgias and gait problem.  Skin: Negative for rash.  Neurological: Negative for dizziness, syncope, speech difficulty, weakness, numbness and headaches.  Hematological: Negative for adenopathy.  Psychiatric/Behavioral: Negative for behavioral problems, dysphoric mood and agitation. The patient is not nervous/anxious.        Objective:   Physical Exam  Constitutional: She is oriented to person, place, and time. She appears well-developed and well-nourished.  HENT:  Head: Normocephalic.  Right Ear: External ear normal.  Left Ear: External ear normal.  Mouth/Throat: Oropharynx is clear and moist.  Eyes: Conjunctivae and EOM are normal. Pupils are equal, round, and reactive to light.  Neck: Normal range of motion. Neck supple. No thyromegaly present.  Cardiovascular: Normal rate, regular rhythm, normal  heart sounds and intact distal pulses.   Pulmonary/Chest: Effort normal and breath sounds normal. No respiratory distress. She has no wheezes. She has no rales.  Abdominal: Soft. Bowel sounds are normal. She exhibits no mass. There is no tenderness.  Musculoskeletal: Normal range of motion.  Lymphadenopathy:    She has no cervical adenopathy.  Neurological: She is alert and oriented to person, place, and time.  Skin: Skin is warm and dry. No rash noted.  Psychiatric: She has a normal mood and affect. Her behavior is  normal.          Assessment & Plan:   Viral URI with pharyngitis and cough.  Will treat symptomatically  Return to work is improved on Saturday, February 11

## 2015-05-24 ENCOUNTER — Other Ambulatory Visit: Payer: Self-pay | Admitting: *Deleted

## 2015-05-24 ENCOUNTER — Other Ambulatory Visit: Payer: Self-pay | Admitting: Internal Medicine

## 2015-05-24 ENCOUNTER — Telehealth: Payer: Self-pay | Admitting: Internal Medicine

## 2015-05-24 MED ORDER — OSELTAMIVIR PHOSPHATE 75 MG PO CAPS: 75.0000 mg | ORAL_CAPSULE | Freq: Every day | ORAL | Status: DC

## 2015-05-24 MED FILL — OSELTAMIVIR PHOS 75 MG CAP: 75 | 10 days supply | Qty: 10 | Fill #0

## 2015-05-24 NOTE — Telephone Encounter (Signed)
Pt states both of her children have the flu, and their pediatrician recommend she call her pcp to get tami flu . Dr Shawna Orleans is out and Dr Raliegh Ip is the last person she saw.  pls advise if he will do.   [facility]/ [address]

## 2015-05-31 NOTE — Telephone Encounter (Signed)
Med called in per rachel

## 2015-10-02 ENCOUNTER — Encounter: Payer: Self-pay | Admitting: *Deleted

## 2015-10-02 ENCOUNTER — Telehealth: Payer: Self-pay | Admitting: *Deleted

## 2015-10-02 NOTE — Telephone Encounter (Signed)
Unable to reach patient at time of Pre-Visit Call.  Left message for patient to return call when available.    

## 2015-10-03 ENCOUNTER — Ambulatory Visit (INDEPENDENT_AMBULATORY_CARE_PROVIDER_SITE_OTHER): Payer: 59 | Admitting: Family Medicine

## 2015-10-03 ENCOUNTER — Encounter: Payer: Self-pay | Admitting: Family Medicine

## 2015-10-03 ENCOUNTER — Ambulatory Visit (HOSPITAL_COMMUNITY): Payer: 59

## 2015-10-03 ENCOUNTER — Ambulatory Visit (HOSPITAL_BASED_OUTPATIENT_CLINIC_OR_DEPARTMENT_OTHER)
Admission: RE | Admit: 2015-10-03 | Discharge: 2015-10-03 | Disposition: A | Discharge status: Home / Self Care | Payer: 59 | Source: Ambulatory Visit | Attending: Family Medicine | Admitting: Family Medicine

## 2015-10-03 VITALS — BP 117/84 | HR 69 | Temp 97.7°F | Ht 63.0 in | Wt 124.0 lb

## 2015-10-03 DIAGNOSIS — R002 Palpitations: Secondary | ICD-10-CM

## 2015-10-03 DIAGNOSIS — M545 Low back pain, unspecified: Secondary | ICD-10-CM

## 2015-10-03 DIAGNOSIS — Z872 Personal history of diseases of the skin and subcutaneous tissue: Secondary | ICD-10-CM | POA: Diagnosis not present

## 2015-10-03 DIAGNOSIS — Z8632 Personal history of gestational diabetes: Secondary | ICD-10-CM

## 2015-10-03 DIAGNOSIS — D72829 Elevated white blood cell count, unspecified: Secondary | ICD-10-CM

## 2015-10-03 LAB — COMPREHENSIVE METABOLIC PANEL
ALBUMIN: 4.5 g/dL (ref 3.5–5.2)
ALK PHOS: 42 U/L (ref 39–117)
ALT: 17 U/L (ref 0–35)
AST: 16 U/L (ref 0–37)
BILIRUBIN TOTAL: 0.7 mg/dL (ref 0.2–1.2)
BUN: 13 mg/dL (ref 6–23)
CO2: 27 mEq/L (ref 19–32)
Calcium: 9.2 mg/dL (ref 8.4–10.5)
Chloride: 105 mEq/L (ref 96–112)
Creatinine, Ser: 0.68 mg/dL (ref 0.40–1.20)
GFR: 102.24 mL/min (ref >60.00)
GLUCOSE: 87 mg/dL (ref 70–99)
POTASSIUM: 4 meq/L (ref 3.5–5.1)
SODIUM: 137 meq/L (ref 135–145)
TOTAL PROTEIN: 7.9 g/dL (ref 6.0–8.3)

## 2015-10-03 LAB — CBC
HEMATOCRIT: 36.5 % (ref 36.0–46.0)
HEMOGLOBIN: 12.3 g/dL (ref 12.0–15.0)
MCHC: 33.8 g/dL (ref 30.0–36.0)
MCV: 89 fl (ref 78.0–100.0)
Platelets: 201 10*3/uL (ref 150.0–400.0)
RBC: 4.09 Mil/uL (ref 3.87–5.11)
RDW: 13.3 % (ref 11.5–15.5)
WBC: 13.1 10*3/uL — AB (ref 4.0–10.5)

## 2015-10-03 LAB — HEMOGLOBIN A1C: Hgb A1c MFr Bld: 5.1 % (ref 4.6–6.5)

## 2015-10-03 LAB — TSH: TSH: 2.46 u[IU]/mL (ref 0.35–4.50)

## 2015-10-03 NOTE — Progress Notes (Addendum)
[place] [facility] [address], [place], [place] [address] [phone]  Date:  10/03/2015   Name:  [name]   DOB:  12-Mar-1976   MRN:  [id number]  PCP:  Lamar Blinks, MD    Chief Complaint: Establish Care   History of Present Illness:  [name] is a 40 y.o. very pleasant female patient who presents with the following:  Here today to establish care with me, transitioning from Dr. Shawna Orleans who is retiring.   History of BTL, gestational DM Most recent labs about one year ago  She had a right breast cyst removed (benign) about 10 years ago, she was meant to have an [place] annually following but has not done so.  [place]   Also, she has noted heart palpitations (describes that she is able to feel her heart beat and it seems fast) and SOB over the last 3 months.  She reports a rare, momentary chest pain if she takes a deep breath.  Back in 2014 she was having similar sx and had an echo which was normal.  Also, when she has the heart palpitations she will feel nauseated.   The palpitations may last for a couple of minutes, "sometimes I just forget that I am having them."   No fainting She has had these palpitations off an on for several years but they have been more pronounced over the last 3 months or so Her MGF died of MI, he was in his 61s she thinks.  Otherwise no particular family history of CAD or arrhythmia  She has never been a smoker  She has never seen cardiology or done a holter/ stress She was noted to have a suppressed TSH in 2014, normal last year however No recent A1c test  She also notes a dull pain in her left lower back which has been present for about 6 months, NKI, does not radiate to the leg, no numbness or weakness noted   Patient Active Problem List   Diagnosis Date Noted  . Dizziness 04/08/2012  . Anemia associated with acute blood loss--due to  adhesions and C/S 11/14/2011  . Status post repeat low transverse cesarean section and BTL 11/11/2011  . Pregnancy with history of caesarean section, antepartum 07/09/2011  . Menstrual cycle disorder 07/09/2011  . Gestational diabetes mellitus in pregnancy 07/09/2011  . AMA (advanced maternal age) multigravida 35+ 07/09/2011    Past Medical History  Diagnosis Date  . History of chicken pox   . History of measles, mumps, or rubella   . History of gestational diabetes   . No pertinent past medical history     Past Surgical History  Procedure Laterality Date  . Cesarean section    . Breast cyst excision  2006  . Tubal ligation      Social History  Substance Use Topics  . Smoking status: Never Smoker   . Smokeless tobacco: Never Used  . Alcohol Use: No    Family History  Problem Relation Age of Onset  . Cancer Mother     cervical  . Arthritis    . Diabetes    . Hyperlipidemia    . Hypertension    . Coronary artery disease Maternal Grandfather   . Asthma Brother   . Cancer Maternal Aunt     colon    No Known Allergies  Medication list has been reviewed  and updated.  Current Outpatient Prescriptions on File Prior to Visit  Medication Sig Dispense Refill  . Cholecalciferol (VITAMIN D3) 5000 units CAPS Take 1 capsule by mouth daily.    . Multiple Vitamin (MULTIVITAMIN) tablet Take 1 tablet by mouth daily.    . Nutritional Supplements (GRAPESEED EXTRACT PO) Take by mouth daily.    . Omega-3 Fatty Acids (FISH OIL) 1000 MG CAPS Take 1 capsule by mouth daily.    . Probiotic Product (PROBIOTIC PO) Take by mouth daily.    . vitamin C (ASCORBIC ACID) 500 MG tablet Take 500 mg by mouth daily.     No current facility-administered medications on file prior to visit.    Review of Systems:  As per HPI- otherwise negative.   Physical Examination: Filed Vitals:   10/03/15 0952  BP: 117/84  Pulse: 69  Temp: 97.7 F (36.5 C)   Filed Vitals:   10/03/15 0952  Height:  5\' 3"  (1.6 m)  Weight: 124 lb (56.246 kg)   Body mass index is 21.97 kg/(m^2). Ideal Body Weight: Weight in (lb) to have BMI = 25: 140.8  GEN: WDWN, NAD, Non-toxic, A & O x 3, petite build, looks well HEENT: Atraumatic, Normocephalic. Neck supple. No masses, No LAD.  Bilateral TM wnl, oropharynx normal.  PEERL,EOMI.   Ears and Nose: No external deformity. CV: RRR, No M/G/R. No JVD. No thrill. No extra heart sounds. PULM: CTA B, no wheezes, crackles, rhonchi. No retractions. No resp. distress. No accessory muscle use. ABD: S, NT, ND, +BS. No rebound. No HSM. EXTR: No c/c/e NEURO Normal gait.  PSYCH: Normally interactive. Conversant. Not depressed or anxious appearing.  Calm demeanor.  Breast: normal exam, no masses/ dimpling/ discharge. Healed scar from lumpectomy right breast at 4:00 She notes a "deep pain" over the left side of the sacrum, but I am not able to reproduce this with palpation.  Normal strength of BLE, negative SLR    EKG: NSR, no ST changes or arrhythmia noted.   Assessment and Plan: Palpitations - Plan: EKG 12-Lead, CBC, Comprehensive metabolic panel, TSH  History of cyst of breast - Plan: MM DIAG BREAST TOMO BILATERAL, US BREAST LTD UNI RIGHT INC AXILLA  History of gestational diabetes - Plan: Hemoglobin A1c  Left-sided low back pain without sciatica - Plan: DG Lumbar Spine Complete  Palpitations are long-standing and likely benign. Will look for any cause in her labs.  If these are negative can refer to cardiology if she likes for further eval Referral for mammo and [place] of right breast History of GDM- screen for DM today Suspect that her lower back pain is also benign, will do plain films for her today  Signed Lamar Blinks, MD  Dg Lumbar Spine Complete  10/03/2015  CLINICAL DATA:  Low back pain on the left for 6 months with no injury or radiculopathy EXAM: LUMBAR SPINE - COMPLETE 4+ VIEW COMPARISON:  None. FINDINGS: There is no evidence of lumbar spine  fracture. Alignment is normal. Intervertebral disc spaces are maintained. IMPRESSION: Negative. Electronically Signed   By: Lajean Manes M.D.   On: 10/03/2015 12:12    Results for orders placed or performed in visit on 10/03/15  CBC  Result Value Ref Range   WBC 13.1 (H) 4.0 - 10.5 K/uL   RBC 4.09 3.87 - 5.11 Mil/uL   Platelets 201.0 150.0 - 400.0 K/uL   Hemoglobin 12.3 12.0 - 15.0 g/dL   HCT 36.5 36.0 - 46.0 %   MCV 89.0  78.0 - 100.0 fl   MCHC 33.8 30.0 - 36.0 g/dL   RDW 13.3 11.5 - 15.5 %  Comprehensive metabolic panel  Result Value Ref Range   Sodium 137 135 - 145 mEq/L   Potassium 4.0 3.5 - 5.1 mEq/L   Chloride 105 96 - 112 mEq/L   CO2 27 19 - 32 mEq/L   Glucose, Bld 87 70 - 99 mg/dL   BUN 13 6 - 23 mg/dL   Creatinine, Ser 0.68 0.40 - 1.20 mg/dL   Total Bilirubin 0.7 0.2 - 1.2 mg/dL   Alkaline Phosphatase 42 39 - 117 U/L   AST 16 0 - 37 U/L   ALT 17 0 - 35 U/L   Total Protein 7.9 6.0 - 8.3 g/dL   Albumin 4.5 3.5 - 5.2 g/dL   Calcium 9.2 8.4 - 10.5 mg/dL   GFR 102.24 >60.00 mL/min  TSH  Result Value Ref Range   TSH 2.46 0.35 - 4.50 uIU/mL  Hemoglobin A1c  Result Value Ref Range   Hgb A1c MFr Bld 5.1 4.6 - 6.5 %

## 2015-10-03 NOTE — Progress Notes (Signed)
Pre visit review using our clinic review tool, if applicable. No additional management support is needed unless otherwise documented below in the visit note. 

## 2015-10-03 NOTE — Patient Instructions (Signed)
It was good to see you today! Please go to the lab for a blood draw, and then to the imaging department on the ground floor for x-rays of your back I will be in touch with your labs and x-ray results We will also arrange for a mammogram and ultrasound of your right breast If you labs are normal, we can refer you to cardiology for evaluation if you like

## 2015-10-03 NOTE — Addendum Note (Signed)
Addended by: Lamar Blinks C on: 10/03/2015 09:28 PM   Modules accepted: Orders

## 2015-10-09 ENCOUNTER — Ambulatory Visit
Admission: RE | Admit: 2015-10-09 | Discharge: 2015-10-09 | Disposition: A | Discharge status: Home / Self Care | Payer: 59 | Source: Ambulatory Visit | Attending: Family Medicine | Admitting: Family Medicine

## 2015-10-09 ENCOUNTER — Other Ambulatory Visit: Payer: Self-pay | Admitting: Family Medicine

## 2015-10-09 DIAGNOSIS — N644 Mastodynia: Secondary | ICD-10-CM | POA: Diagnosis not present

## 2015-10-09 DIAGNOSIS — Z872 Personal history of diseases of the skin and subcutaneous tissue: Secondary | ICD-10-CM

## 2015-10-09 DIAGNOSIS — N63 Unspecified lump in breast: Secondary | ICD-10-CM | POA: Diagnosis not present

## 2016-06-01 ENCOUNTER — Encounter (HOSPITAL_BASED_OUTPATIENT_CLINIC_OR_DEPARTMENT_OTHER): Payer: Self-pay | Admitting: *Deleted

## 2016-06-01 ENCOUNTER — Emergency Department (HOSPITAL_BASED_OUTPATIENT_CLINIC_OR_DEPARTMENT_OTHER)
Admission: EM | Admit: 2016-06-01 | Discharge: 2016-06-01 | Disposition: A | Discharge status: Home / Self Care | Payer: 59 | Attending: Emergency Medicine | Admitting: Emergency Medicine

## 2016-06-01 DIAGNOSIS — E876 Hypokalemia: Secondary | ICD-10-CM | POA: Diagnosis not present

## 2016-06-01 DIAGNOSIS — R197 Diarrhea, unspecified: Secondary | ICD-10-CM | POA: Insufficient documentation

## 2016-06-01 DIAGNOSIS — R55 Syncope and collapse: Secondary | ICD-10-CM | POA: Insufficient documentation

## 2016-06-01 DIAGNOSIS — R109 Unspecified abdominal pain: Secondary | ICD-10-CM | POA: Insufficient documentation

## 2016-06-01 LAB — CBC
HEMATOCRIT: 36.7 % (ref 36.0–46.0)
HEMOGLOBIN: 12.6 g/dL (ref 12.0–15.0)
MCH: 30.6 pg (ref 26.0–34.0)
MCHC: 34.3 g/dL (ref 30.0–36.0)
MCV: 89.1 fL (ref 78.0–100.0)
Platelets: 231 10*3/uL (ref 150–400)
RBC: 4.12 MIL/uL (ref 3.87–5.11)
RDW: 12.8 % (ref 11.5–15.5)
WBC: 8.4 10*3/uL (ref 4.0–10.5)

## 2016-06-01 LAB — BASIC METABOLIC PANEL
ANION GAP: 6 (ref 5–15)
BUN: 11 mg/dL (ref 6–20)
CO2: 26 mmol/L (ref 22–32)
Calcium: 8.7 mg/dL — ABNORMAL LOW (ref 8.9–10.3)
Chloride: 103 mmol/L (ref 101–111)
Creatinine, Ser: 0.87 mg/dL (ref 0.44–1.00)
Glucose, Bld: 137 mg/dL — ABNORMAL HIGH (ref 65–99)
POTASSIUM: 3.4 mmol/L — AB (ref 3.5–5.1)
SODIUM: 135 mmol/L (ref 135–145)

## 2016-06-01 LAB — URINALYSIS, ROUTINE W REFLEX MICROSCOPIC
Bilirubin Urine: NEGATIVE
Glucose, UA: NEGATIVE mg/dL
Hgb urine dipstick: NEGATIVE
Ketones, ur: NEGATIVE mg/dL
LEUKOCYTES UA: NEGATIVE
Nitrite: NEGATIVE
Protein, ur: NEGATIVE mg/dL
SPECIFIC GRAVITY, URINE: 1.016 (ref 1.005–1.030)
pH: 7 (ref 5.0–8.0)

## 2016-06-01 LAB — PREGNANCY, URINE: PREG TEST UR: NEGATIVE

## 2016-06-01 MED ORDER — SODIUM CHLORIDE 0.9 % IV BOLUS (SEPSIS)
1000.0000 mL | Freq: Once | INTRAVENOUS | Status: AC
  Administered 2016-06-01: 1000 mL via INTRAVENOUS

## 2016-06-01 MED ORDER — ONDANSETRON HCL 4 MG/2ML IJ SOLN
4.0000 mg | Freq: Once | INTRAMUSCULAR | Status: AC
  Administered 2016-06-01: 4 mg via INTRAVENOUS
  Filled 2016-06-01: qty 2

## 2016-06-01 MED ORDER — ONDANSETRON 4 MG PO TBDP: 4.0000 mg | ORAL_TABLET | Freq: Three times a day (TID) | ORAL | 0 refills | Status: AC | PRN

## 2016-06-01 NOTE — ED Triage Notes (Signed)
After lunch she woke with abdominal cramps. She walked to the bath room and got dizzy, states she passed out. She was able to get to the commode, had a bowel movement and felt like she was going to pass out again. She was having an irregular heartbeat prior to passing out.

## 2016-06-01 NOTE — ED Provider Notes (Addendum)
[facility] Provider Note   CSN: [id number] Arrival date & time: 06/01/16  1550  By signing my name below, I, [name], attest that this documentation has been prepared under the direction and in the presence of Fatima Blank, MD. Electronically Signed: Jeanell Fields, Scribe. 06/01/2016. 4:52 PM.  History   Chief Complaint Chief Complaint  Patient presents with  . Near Syncope   The history is provided by the patient and the spouse. No language interpreter was used.   HPI Comments: [name] is a 41 y.o. female who presents to the Emergency Department complaining of near-syncope that occurred today. She reports waking up from sleep with abdominal cramping. She states she had sudden onset of dizziness when she entered the bathroom that continued as she sat on the toilet and moved her bowels. Her BM then was weak with associated diarrhea (loose stool). She currently has residual dizziness. She further reports associated nausea, increased heart rate, abdominal cramping, and dizziness. She has been having intermittent mild episodes of nausea and dizziness over the past 2 months. 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 She has a hx of tubal ligation. She denies any sick contacts, fever, congestion, rhinorrhea, chest pain, vomiting, blood in stool, watery stool, or other complaints.     PCP: Lamar Blinks, MD  Past Medical History:  Diagnosis Date  . History of chicken pox   . History of gestational diabetes   . History of measles, mumps, or rubella   . No pertinent past medical history     Patient Active Problem List   Diagnosis Date Noted  . Dizziness 04/08/2012  . Anemia associated with acute blood loss--due to adhesions and C/S 11/14/2011  . Status post repeat low transverse cesarean section and BTL 11/11/2011  . Pregnancy with history of caesarean section, antepartum 07/09/2011  . Menstrual cycle disorder 07/09/2011  . Gestational  diabetes mellitus in pregnancy 07/09/2011  . AMA (advanced maternal age) multigravida 35+ 07/09/2011    Past Surgical History:  Procedure Laterality Date  . BREAST CYST EXCISION  2006  . CESAREAN SECTION    . TUBAL LIGATION      OB History    Gravida Para Term Preterm AB Living   3 3 3     3    SAB TAB Ectopic Multiple Live Births           3       Home Medications    Prior to Admission medications   Medication Sig Start Date End Date Taking? Authorizing Provider  Cholecalciferol (VITAMIN D3) 5000 units CAPS Take 1 capsule by mouth daily.    Historical Provider, MD  GLUTATHIONE PO Take 1 capsule (500 mg) by mouth daily    Historical Provider, MD  Multiple Vitamin (MULTIVITAMIN) tablet Take 1 tablet by mouth daily.    Historical Provider, MD  Nutritional Supplements (GRAPESEED EXTRACT PO) Take by mouth daily.    Historical Provider, MD  Omega-3 Fatty Acids (FISH OIL) 1000 MG CAPS Take 1 capsule by mouth daily.    Historical Provider, MD  ondansetron (ZOFRAN ODT) 4 MG disintegrating tablet Take 1 tablet (4 mg total) by mouth every 8 (eight) hours as needed for nausea or vomiting. 06/01/16 06/04/16  Fatima Blank, MD  Probiotic Product (PROBIOTIC PO) Take by mouth daily.    Historical Provider, MD  vitamin C (ASCORBIC ACID) 500 MG tablet Take 500 mg by mouth daily.    Historical Provider, MD  Family History Family History  Problem Relation Age of Onset  . Cancer Mother     cervical  . Arthritis    . Diabetes    . Hyperlipidemia    . Hypertension    . Coronary artery disease Maternal Grandfather   . Asthma Brother   . Cancer Maternal Aunt     colon    Social History Social History  Substance Use Topics  . Smoking status: Never Smoker  . Smokeless tobacco: Never Used  . Alcohol use No     Allergies   Patient has no known allergies.   Review of Systems Review of Systems A complete 10 system review of systems was obtained and all systems are negative  except as noted in the HPI and PMH.    Physical Exam Updated Vital Signs BP 107/78   Pulse 69   Temp 98 F (36.7 C) (Oral)   Resp 16   Ht 5\' 4"  (1.626 m)   Wt 120 lb (54.4 kg)   LMP 05/18/2016   SpO2 100%   BMI 20.60 kg/m   Physical Exam  Constitutional: She is oriented to person, place, and time. She appears well-developed and well-nourished. No distress.  HENT:  Head: Normocephalic and atraumatic.  Nose: Nose normal.  Eyes: Conjunctivae and EOM are normal. Pupils are equal, round, and reactive to light. Right eye exhibits no discharge. Left eye exhibits no discharge. No scleral icterus.  Neck: Normal range of motion. Neck supple.  Cardiovascular: Normal rate and regular rhythm.  Exam reveals no gallop and no friction rub.   No murmur heard. Pulmonary/Chest: Effort normal and breath sounds normal. No stridor. No respiratory distress. She has no rales.  Abdominal: Soft. She exhibits no distension. There is no tenderness.  Musculoskeletal: She exhibits no edema or tenderness.  Neurological: She is alert and oriented to person, place, and time.  Skin: Skin is warm and dry. No rash noted. She is not diaphoretic. No erythema.  Psychiatric: She has a normal mood and affect.  Vitals reviewed.    ED Treatments / Results  DIAGNOSTIC STUDIES: Oxygen Saturation is 100% on RA, normal by my interpretation.    COORDINATION OF CARE: 4:56 PM- Pt advised of plan for treatment and pt agrees.  Labs (all labs ordered are listed, but only abnormal results are displayed) Labs Reviewed  URINALYSIS, ROUTINE W REFLEX MICROSCOPIC - Abnormal; Notable for the following:       Result Value   APPearance CLOUDY (*)    All other components within normal limits  BASIC METABOLIC PANEL - Abnormal; Notable for the following:    Potassium 3.4 (*)    Glucose, Bld 137 (*)    Calcium 8.7 (*)    All other components within normal limits  PREGNANCY, URINE  CBC    EKG  EKG  Interpretation  Date/Time:  Monday June 01 2016 16:11:01 EDT Ventricular Rate:  86 PR Interval:  162 QRS Duration: 94 QT Interval:  402 QTC Calculation: 481 R Axis:   36 Text Interpretation:  Normal sinus rhythm with sinus arrhythmia Incomplete right bundle branch block Prolonged QT Abnormal ECG No old tracing to compare Confirmed by Fulda ([id number]) on 06/01/2016 4:21:10 PM       Radiology No results found.  Procedures Procedures (including critical care time)  Medications Ordered in ED Medications  sodium chloride 0.9 % bolus 1,000 mL (0 mLs Intravenous Stopped 06/01/16 1911)  ondansetron (ZOFRAN) injection 4 mg (4 mg Intravenous Given 06/01/16  1910)     Initial Impression / Assessment and Plan / ED Course  I have reviewed the triage vital signs and the nursing notes.  Pertinent labs & imaging results that were available during my care of the patient were reviewed by me and considered in my medical decision making (see chart for details).     Most consistent with vasovagal episode secondary to GI symptoms. Patient is well-appearing, well-hydrated, nontoxic, in no acute distress. Initial EKG with mild prolonged QT. Labs were notable for mild hypokalemia and hypocalcemia. Repeat EKG with resolved QT prolongation. Given the patient's symptomatology surrounding the episode, I have low suspicion for cardiac etiology. However I do feel that patient should follow-up with cardiology closely given the notable for QT prolongation and history of reported cardiomyopathy during pregnancy 5 years ago. UPT negative.   Patient was able to tolerate by mouth hydration in the emergency Department and ambulate without complication.  The patient is safe for discharge with strict return precautions.   Final Clinical Impressions(s) / ED Diagnoses   Final diagnoses:  Near syncope  Hypocalcemia  Hypokalemia  Abdominal cramping  Diarrhea, unspecified type   Disposition:  Discharge  Condition: Good  I have discussed the results, Dx and Tx plan with the patient and husband who expressed understanding and agree(s) with the plan. Discharge instructions discussed at great length. The patient and husband was given strict return precautions who verbalized understanding of the instructions. No further questions at time of discharge.    New Prescriptions   ONDANSETRON (ZOFRAN ODT) 4 MG DISINTEGRATING TABLET    Take 1 tablet (4 mg total) by mouth every 8 (eight) hours as needed for nausea or vomiting.    Follow Up: Darreld Mclean, [address] [place] [address] [phone]  Schedule an appointment as soon as possible for a visit  in 3-5 days, If symptoms do not improve or  worsen  Cardiology  Schedule an appointment as soon as possible for a visit     I personally performed the services described in this documentation, which was scribed in my presence. The recorded information has been reviewed and is accurate.        Fatima Blank, MD 06/01/16 2026

## 2016-06-02 MED FILL — ONDANSETRON ODT 4 MG TABLET: 4 | 5 days supply | Qty: 15 | Fill #0

## 2016-06-04 ENCOUNTER — Encounter: Payer: Self-pay | Admitting: Family Medicine

## 2016-06-04 ENCOUNTER — Ambulatory Visit (INDEPENDENT_AMBULATORY_CARE_PROVIDER_SITE_OTHER): Payer: 59 | Admitting: Family Medicine

## 2016-06-04 VITALS — BP 126/84 | HR 79 | Temp 98.0°F | Ht 64.0 in | Wt 125.0 lb

## 2016-06-04 DIAGNOSIS — R42 Dizziness and giddiness: Secondary | ICD-10-CM

## 2016-06-04 DIAGNOSIS — I951 Orthostatic hypotension: Secondary | ICD-10-CM | POA: Diagnosis not present

## 2016-06-04 DIAGNOSIS — E878 Other disorders of electrolyte and fluid balance, not elsewhere classified: Secondary | ICD-10-CM

## 2016-06-04 DIAGNOSIS — R519 Headache, unspecified: Secondary | ICD-10-CM

## 2016-06-04 DIAGNOSIS — R9431 Abnormal electrocardiogram [ECG] [EKG]: Secondary | ICD-10-CM | POA: Diagnosis not present

## 2016-06-04 DIAGNOSIS — R51 Headache: Secondary | ICD-10-CM

## 2016-06-04 LAB — COMPREHENSIVE METABOLIC PANEL
ALBUMIN: 4.6 g/dL (ref 3.5–5.2)
ALT: 11 U/L (ref 0–35)
AST: 16 U/L (ref 0–37)
Alkaline Phosphatase: 45 U/L (ref 39–117)
BUN: 9 mg/dL (ref 6–23)
CALCIUM: 9.7 mg/dL (ref 8.4–10.5)
CO2: 23 mEq/L (ref 19–32)
Chloride: 104 mEq/L (ref 96–112)
Creatinine, Ser: 0.65 mg/dL (ref 0.40–1.20)
GFR: 107.34 mL/min (ref >60.00)
Glucose, Bld: 89 mg/dL (ref 70–99)
POTASSIUM: 4.2 meq/L (ref 3.5–5.1)
Sodium: 138 mEq/L (ref 135–145)
TOTAL PROTEIN: 8 g/dL (ref 6.0–8.3)
Total Bilirubin: 0.3 mg/dL (ref 0.2–1.2)

## 2016-06-04 NOTE — Progress Notes (Signed)
[place] at [facility] [address], [place], [place] [address] [phone]  Date:  06/04/2016   Name:  [name]   DOB:  02-22-1976   MRN:  [id number]  PCP:  Lamar Blinks, MD    Chief Complaint: Hospitalization Follow-up (ER visit 06/01/16 for dizziness and nausea. Pt states that sx's have been present off and on x 1 yr. Saw eye doctor 2 yrs ago, referall sent for neurologist but pt never went. Taking Zofran as prescribed which does help with sx's. )   History of Present Illness:  [name] is a 41 y.o. very pleasant female patient who presents with the following:  Generally healthy young woman who is here today to follow-up from ER visit earlier this week- she had a vagal episode associated with GI symptoms.  See partial ER note below:  Most consistent with vasovagal episode secondary to GI symptoms. Patient is well-appearing, well-hydrated, nontoxic, in no acute distress. Initial EKG with mild prolonged QT. Labs were notable for mild hypokalemia and hypocalcemia. Repeat EKG with resolved QT prolongation. Given the patient's symptomatology surrounding the episode, I have low suspicion for cardiac etiology. However I do feel that patient should follow-up with cardiology closely given the notable for QT prolongation and history of reported cardiomyopathy during pregnancy 5 years ago. UPT negative.   Will repeat her labs today - need to check on her K and calcium Reviewed EKG from ER- her QTc is indeed long at 480 on most recent EKG, and it was normal last summer   She is a never smoker, s/p BTL  Currently she is still feeling a bit nauseated but no more diarrhea No vomiting.    She notes that she has had intermittent nausea, dizziness (describes as vertigo) and headache for about one year.  Sometimes the HA will be associated with right eye pain and she has seen her eye doctor for evaluation of same.  Her  ophthalmologist referred her to see neurology but she has not done so as of yet.    She would like to go ahead and be referred now  She also notes very occasional heart palpitations currently; they do not last long  She had a normal echo in 2014- it seems that she was also meant to see cardiology at some point in the last 2 years but has not done so yet- would like to do now  She did have GDM per history but most recent A1c was normal Lab Results  Component Value Date   HGBA1C 5.1 10/03/2015     Patient Active Problem List   Diagnosis Date Noted  . Dizziness 04/08/2012  . Anemia associated with acute blood loss--due to adhesions and C/S 11/14/2011  . Status post repeat low transverse cesarean section and BTL 11/11/2011  . Pregnancy with history of caesarean section, antepartum 07/09/2011  . Menstrual cycle disorder 07/09/2011  . Gestational diabetes mellitus in pregnancy 07/09/2011  . AMA (advanced maternal age) multigravida 35+ 07/09/2011    Past Medical History:  Diagnosis Date  . History of chicken pox   . History of gestational diabetes   . History of measles, mumps, or rubella   . No pertinent past medical history     Past Surgical History:  Procedure Laterality Date  . BREAST CYST EXCISION  2006  . CESAREAN SECTION    . TUBAL LIGATION      Social History  Substance Use Topics  .  Smoking status: Never Smoker  . Smokeless tobacco: Never Used  . Alcohol use No    Family History  Problem Relation Age of Onset  . Cancer Mother     cervical  . Diabetes Mother   . Hypertension Mother   . Arthritis    . Diabetes    . Hyperlipidemia    . Hypertension    . Coronary artery disease Maternal Grandfather   . Asthma Brother   . Cancer Maternal Aunt     colon    No Known Allergies  Medication list has been reviewed and updated.  Current Outpatient Prescriptions on File Prior to Visit  Medication Sig Dispense Refill  . Cholecalciferol (VITAMIN D3) 5000 units  CAPS Take 1 capsule by mouth daily.    Marland Kitchen GLUTATHIONE PO Take 1 capsule (500 mg) by mouth daily    . Multiple Vitamin (MULTIVITAMIN) tablet Take 1 tablet by mouth daily.    . Nutritional Supplements (GRAPESEED EXTRACT PO) Take by mouth daily.    . Omega-3 Fatty Acids (FISH OIL) 1000 MG CAPS Take 1 capsule by mouth daily.    . ondansetron (ZOFRAN ODT) 4 MG disintegrating tablet Take 1 tablet (4 mg total) by mouth every 8 (eight) hours as needed for nausea or vomiting. 15 tablet 0  . Probiotic Product (PROBIOTIC PO) Take by mouth daily.    . vitamin C (ASCORBIC ACID) 500 MG tablet Take 500 mg by mouth daily.     No current facility-administered medications on file prior to visit.     Review of Systems:  As per HPI- otherwise negative.   Physical Examination: Vitals:   06/04/16 1402  BP: 126/84  Pulse: 79  Temp: 98 F (36.7 C)   Vitals:   06/04/16 1402  Weight: 125 lb (56.7 kg)  Height: 5\' 4"  (1.626 m)   Body mass index is 21.46 kg/m. Ideal Body Weight: Weight in (lb) to have BMI = 25: 145.3  GEN: WDWN, NAD, Non-toxic, A & O x 3, looks very well HEENT: Atraumatic, Normocephalic. Neck supple. No masses, No LAD.  Bilateral TM wnl, oropharynx normal.  PEERL,EOMI.   Ears and Nose: No external deformity. CV: RRR, No M/G/R. No JVD. No thrill. No extra heart sounds. PULM: CTA B, no wheezes, crackles, rhonchi. No retractions. No resp. distress. No accessory muscle use. ABD: S, NT, ND, +BS. No rebound. No HSM.  Benign belly EXTR: No c/c/e NEURO Normal gait.  Normal strength, sensation and DTR of all extremities  PSYCH: Normally interactive. Conversant. Not depressed or anxious appearing.  Calm demeanor.   Orthostatic BP as below  Orthostatic VS for the past 24 hrs:  BP- Lying Pulse- Lying BP- Sitting Pulse- Sitting BP- Standing at 0 minutes Pulse- Standing at 0 minutes  06/04/16 1437 132/82 76 124/88 82 142/90 90      Assessment and Plan: Electrolyte abnormality - Plan:  Comprehensive metabolic panel  EKG abnormalities - Plan: Ambulatory referral to Cardiology  Orthostatic hypotension - Plan: Ambulatory referral to Cardiology  Dizziness - Plan: Ambulatory referral to Neurology  Frequent headaches - Plan: Ambulatory referral to Neurology  Here today to follow-up from ER visit on Monday (today is Thursday) for a near syncopal episode thought vagal in nature and related to diarrhea She is overall feeling better today, but also describes other symptoms including a feeling of vertigo, headaches, head pain, palpitations for a year or so. These are not acute or worse today Will refer to neurology and cardiology and follow-up  on mild lab abnl as above She will alert me if any change or worsening of her sx in the meantime   Signed Lamar Blinks, MD

## 2016-06-04 NOTE — Patient Instructions (Signed)
We are going to follow-up on your electrolytes today, and I am going to refer you to neurology and to cardiology Take it easy and get up slowly (from sitting to standing, etc) Please let me know if any change in your condition and I will be in touch with your labs asap

## 2016-06-05 ENCOUNTER — Encounter: Payer: Self-pay | Admitting: Family Medicine

## 2016-06-05 NOTE — Telephone Encounter (Signed)
We referred the pt to [facility] due to the request of being seen in 2 weeks. We can certainly send pt to [facility], but they typically book out 4-6 weeks. Please let me know how to proceed and I am happy to notify the patient.

## 2016-06-08 ENCOUNTER — Ambulatory Visit: Payer: Self-pay | Admitting: Neurology

## 2016-06-18 ENCOUNTER — Encounter: Payer: Self-pay | Admitting: Internal Medicine

## 2016-06-18 ENCOUNTER — Ambulatory Visit (INDEPENDENT_AMBULATORY_CARE_PROVIDER_SITE_OTHER): Payer: 59 | Admitting: Internal Medicine

## 2016-06-18 VITALS — BP 142/84 | HR 76 | Ht 64.0 in | Wt 127.0 lb

## 2016-06-18 DIAGNOSIS — R55 Syncope and collapse: Secondary | ICD-10-CM | POA: Diagnosis not present

## 2016-06-18 NOTE — Progress Notes (Signed)
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. She recently had an episode where she woke up from a nap to get ready for work and felt intense abdominal pain. This is associated with some palpitations and dizziness as well as headache. She's been having periodic headache and pain behind her eye. She denies any significant photophobia or phonophobia but there is associated nausea. She was apparently pale and mildly diaphoretic during this episode. She tried to stand up and go to the bathroom which time she became near syncopal. She then had some loose stool but no recurrent diarrhea or any fever at that time. She denied any vomiting. She was presented to the emergency department and received antibiotics and fluids. She was apparently orthostatic at the time. She said she had her last child in 2013 at the time she was noted to be hypotensive. She says that she normally runs a low blood pressure around 00-867 systolic. Around the time of her pregnancy she developed significant swelling associated with IV fluids and was diuresed. An echo was performed to look for a possible peripartum cardiomyopathy however EF was 55-60%. She occasionally gets some palpitations and a were palpitations associated with this. Is not clear if this was related to some anxiety about the event or low blood pressure.  PMHx:  Past Medical History:  Diagnosis Date  . History of chicken pox   . History of gestational diabetes   . History of measles, mumps, or rubella   . No pertinent past medical history     Past Surgical History:  Procedure Laterality Date  . BREAST CYST EXCISION  2006  . CESAREAN SECTION    . TUBAL LIGATION      FAMHx:  Family History  Problem Relation Age of Onset  . Cancer Mother     cervical  . Diabetes Mother   . Hypertension Mother   . Arthritis    .  Diabetes    . Hyperlipidemia    . Hypertension    . Coronary artery disease Maternal Grandfather   . Asthma Brother   . Cancer Maternal Aunt     colon    SOCHx:   reports that she has never smoked. She has never used smokeless tobacco. She reports that she does not drink alcohol or use drugs.  ALLERGIES:  No Known Allergies  ROS: Pertinent items noted in HPI and remainder of comprehensive ROS otherwise negative.  HOME MEDS: Current Outpatient Prescriptions on File Prior to Visit  Medication Sig Dispense Refill  . GLUTATHIONE PO Take 1 capsule (500 mg) by mouth daily    . Nutritional Supplements (GRAPESEED EXTRACT PO) Take by mouth daily.     No current facility-administered medications on file prior to visit.     LABS/IMAGING: No results found for this or any previous visit (from the past 48 hour(s)). No results found.  WEIGHTS: Wt Readings from Last 3 Encounters:  06/18/16 127 lb (57.6 kg)  06/04/16 125 lb (56.7 kg)  06/01/16 120 lb (54.4 kg)    VITALS: BP (!) 142/84   Pulse 76   Ht 5\' 4"  (1.626 m)   Wt 127 lb (57.6 kg)   BMI 21.80 kg/m   EXAM: General appearance: alert and no distress Neck: no carotid bruit and no JVD Lungs: clear to auscultation bilaterally Heart: regular rate and rhythm  Abdomen: soft, non-tender; bowel sounds normal; no masses,  no organomegaly Extremities: extremities normal, atraumatic, no cyanosis or edema Pulses: 2+ and symmetric Skin: Skin color, texture, turgor normal. No rashes or lesions Neurologic: Grossly normal Psych: Pleasant  EKG: Normal sinus rhythm at 76, incomplete RBBB  ASSESSMENT: 1. Probable vasovagal pre-syncope 2. Recurrent headaches, question migraine 3. Questionable history of pericardium cardiopathy with normal echo findings in 2014  PLAN: 1.   [name] had an episode which is most consistent with vasovagal presyncope. She nearly syncopized when trying to stand up and go to the bathroom after she had a  ready felt nauseated and dizzy with low blood pressure. She understands that she needs to stay lying supine for at least 5-10 minutes when these episodes come on and should drink plain water to over hydrate herself. I suspect this is related to possible neurologic cause as she's been having recurrent headaches, possibly migraine related. She's not had head imaging. She does have an appointment tomorrow with Dr. Jannifer Fields in neurology. There is a questionable history she reported a peripartum cardiomyopathy however the echo she had in January 2014 was normal. I would like to repeat her echo to make sure there isn't any cardiomyopathy. I don't think the palpitations were the cause of her symptoms. She's not had any further episodes since his last episode and no history of vasovagal syncope in the past.  Follow-up after the echo and will await neurologic evaluation. Thanks for the kind referral.  Natalie Casino, MD, [facility] [place] Attending Cardiologist [place] 06/18/2016, 10:38 AM

## 2016-06-18 NOTE — Patient Instructions (Signed)
Your physician has requested that you have an echocardiogram @ [address] - [address]rd Floor. Echocardiography is a painless test that uses sound waves to create images of your heart. It provides your doctor with information about the size and shape of your heart and how well your heart's chambers and valves are working. This procedure takes approximately one hour. There are no restrictions for this procedure.  Your physician recommends that you schedule a follow-up appointment after your echo.

## 2016-06-19 ENCOUNTER — Ambulatory Visit (INDEPENDENT_AMBULATORY_CARE_PROVIDER_SITE_OTHER): Payer: 59 | Admitting: Neurology

## 2016-06-19 ENCOUNTER — Encounter: Payer: Self-pay | Admitting: Neurology

## 2016-06-19 VITALS — BP 114/77 | HR 87 | Ht 64.0 in | Wt 125.0 lb

## 2016-06-19 DIAGNOSIS — G43019 Migraine without aura, intractable, without status migrainosus: Secondary | ICD-10-CM | POA: Insufficient documentation

## 2016-06-19 DIAGNOSIS — R55 Syncope and collapse: Secondary | ICD-10-CM

## 2016-06-19 NOTE — Progress Notes (Signed)
Reason for visit: Near syncope  Referring physician: Dr. Sallyanne Kuster [name] is a 41 y.o. female  History of present illness:  [name] is a 41 year old right-handed female with a history of an event that occurred around 06/01/2016. The patient went to the emergency room for an episode of near-syncope. The patient had significant abdominal cramps prior to the event, she got up to go the bathroom and felt lightheaded, she was blanched in the face, and diaphoretic. The patient did not actually black out, she felt as if she almost blacked out with dimming of vision. The patient has been seen through cardiology who felt that she had a vasovagal syncopal event. The patient also reports a 2 or 3 year history of intermittent headaches that have become very frequent, occurring every other day on average. The headaches are all over the head, behind the right eye or the left eye, and she may have some numbness sensations in the back of the head with the headache. The patient may also get vertigo associated with the headache, she never has any vomiting with this event, she denies any cognitive clouding. She denies any numbness or weakness of the face, arms, or legs. She denies any issues with balance or difficulty controlling the bowels or the bladder. The headaches do not prevent her from working. She denies a family history of headache. She denies any known aggravating factors for her headache. The patient takes Tylenol if needed, she also reports that she drinks on average 5 cups of hot tea a day and 2 cups of coffee daily. She does not drink soft drinks. She has had an ophthalmologic evaluation previously that was unremarkable. She denies any significant neck stiffness. She is sent to this office for an evaluation.  Past Medical History:  Diagnosis Date  . History of chicken pox   . History of gestational diabetes   . History of measles, mumps, or rubella   . No pertinent past medical  history     Past Surgical History:  Procedure Laterality Date  . BREAST CYST EXCISION  2006  . CESAREAN SECTION    . TUBAL LIGATION      Family History  Problem Relation Age of Onset  . Cancer Mother     cervical  . Diabetes Mother   . Hypertension Mother   . Arthritis    . Diabetes    . Hyperlipidemia    . Hypertension    . Coronary artery disease Maternal Grandfather   . Asthma Brother   . Cancer Maternal Aunt     colon    Social history:  reports that she has never smoked. She has never used smokeless tobacco. She reports that she drinks alcohol. She reports that she does not use drugs.  Medications:  Prior to Admission medications   Medication Sig Start Date End Date Taking? Authorizing Provider  GLUTATHIONE PO Take 1 capsule (500 mg) by mouth daily   Yes Historical Provider, MD  Nutritional Supplements (GRAPESEED EXTRACT PO) Take by mouth daily.   Yes Historical Provider, MD  vitamin C (ASCORBIC ACID) 500 MG tablet Take 500 mg by mouth daily.   Yes Historical Provider, MD  vitamin E 1000 UNIT capsule Take 1,000 Units by mouth daily.   Yes Historical Provider, MD     No Known Allergies  ROS:  Out of a complete 14 system review of symptoms, the patient complains only of the following symptoms, and all other reviewed systems are  negative.  Fatigue Eye pain Palpitations of the heart Neck stiffness Dizziness, headache, near-syncope  Blood pressure 114/77, pulse 87, height 5\' 4"  (1.626 m), weight 125 lb (56.7 kg).  Physical Exam  General: The patient is alert and cooperative at the time of the examination.  Eyes: Pupils are equal, round, and reactive to light. Discs are flat bilaterally. Good venous pulsations are seen bilaterally.  Neck: The neck is supple, no carotid bruits are noted.  Respiratory: The respiratory examination is clear.  Cardiovascular: The cardiovascular examination reveals a regular rate and rhythm, no obvious murmurs or rubs are  noted.  Skin: Extremities are without significant edema.  Neurologic Exam  Mental status: The patient is alert and oriented x 3 at the time of the examination. The patient has apparent normal recent and remote memory, with an apparently normal attention span and concentration ability.  Cranial nerves: Facial symmetry is present. There is good sensation of the face to pinprick and soft touch bilaterally. The strength of the facial muscles and the muscles to head turning and shoulder shrug are normal bilaterally. Speech is well enunciated, no aphasia or dysarthria is noted. Extraocular movements are full. Visual fields are full. The tongue is midline, and the patient has symmetric elevation of the soft palate. No obvious hearing deficits are noted.  Motor: The motor testing reveals 5 over 5 strength of all 4 extremities. Good symmetric motor tone is noted throughout.  Sensory: Sensory testing is intact to pinprick, soft touch, vibration sensation, and position sense on all 4 extremities. No evidence of extinction is noted.  Coordination: Cerebellar testing reveals good finger-nose-finger and heel-to-shin bilaterally.  Gait and station: Gait is normal. Tandem gait is normal. Romberg is negative. No drift is seen.  Reflexes: Deep tendon reflexes are symmetric and normal bilaterally. Toes are downgoing bilaterally.   Assessment/Plan:  1. Vasovagal syncope  2. Migraine headache  3. Caffeine overuse  The patient has been instructed to reduce her caffeine intake. The patient likely had a vasovagal event, she reports palpitations of the heart that may also be associated with overuse of caffeine. The patient does not wish to go on daily medications for her migraine headache. She will call me if she changes her mind. The clinical examination today was normal, there is no indication for further neurologic workup. The patient will follow-up if needed.  Jill Alexanders MD 06/19/2016 9:56  AM  [facility] [address] [place], [place] [address]  Phone [phone] Fax [phone]

## 2016-07-06 ENCOUNTER — Other Ambulatory Visit: Payer: Self-pay

## 2016-07-06 ENCOUNTER — Ambulatory Visit (HOSPITAL_COMMUNITY): Payer: 59 | Attending: Cardiology

## 2016-07-06 DIAGNOSIS — I361 Nonrheumatic tricuspid (valve) insufficiency: Secondary | ICD-10-CM | POA: Diagnosis not present

## 2016-07-06 DIAGNOSIS — I340 Nonrheumatic mitral (valve) insufficiency: Secondary | ICD-10-CM | POA: Insufficient documentation

## 2016-07-06 DIAGNOSIS — R55 Syncope and collapse: Secondary | ICD-10-CM | POA: Diagnosis not present

## 2016-08-03 ENCOUNTER — Encounter: Payer: Self-pay | Admitting: Internal Medicine

## 2016-08-03 ENCOUNTER — Ambulatory Visit (INDEPENDENT_AMBULATORY_CARE_PROVIDER_SITE_OTHER): Payer: 59 | Admitting: Internal Medicine

## 2016-08-03 VITALS — BP 111/79 | HR 83 | Ht 64.0 in | Wt 122.0 lb

## 2016-08-03 DIAGNOSIS — R55 Syncope and collapse: Secondary | ICD-10-CM | POA: Diagnosis not present

## 2016-08-03 DIAGNOSIS — R002 Palpitations: Secondary | ICD-10-CM | POA: Diagnosis not present

## 2016-08-03 NOTE — Progress Notes (Signed)
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 This is associated with some palpitations and dizziness as well as headache. She's been having periodic headache and pain behind her eye. She denies any significant photophobia or phonophobia but there is associated nausea. She was apparently pale and mildly diaphoretic during this episode. She tried to stand up and go to the bathroom which time she became near syncopal. She then had some loose stool but no recurrent diarrhea or any fever at that time. She denied any vomiting. She was presented to the emergency department and received antibiotics and fluids. She was apparently orthostatic at the time. She said she had her last child in 2013 at the time she was noted to be hypotensive. She says that she normally runs a low blood pressure around 83-151 systolic. Around the time of her pregnancy she developed significant swelling associated with IV fluids and was diuresed. An echo was performed to look for a possible peripartum cardiomyopathy however EF was 55-60%. She occasionally gets some palpitations and a were palpitations associated with this. Is not clear if this was related to some anxiety about the event or low blood pressure.  08/03/2016  Natalie returns for follow-up. She underwent an echocardiogram which showed normal LV function. She's had no further syncopal episodes. The symptoms were most consistent with vasovagal syncope. She also saw Dr. Jannifer Franklin with neurology who concurs with that assessment. It seems that stress and being overloaded her job may have contributed to her symptoms. She is also reduced her caffeine intake. She suffers from migraine headaches at times, again likely  brought on by stress at work. 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  PMHx:  Past Medical History:  Diagnosis Date  . History of chicken pox   . History of gestational diabetes   . History of measles, mumps, or rubella   . No pertinent past medical history     Past Surgical History:  Procedure Laterality Date  . BREAST CYST EXCISION  2006  . CESAREAN SECTION    . TUBAL LIGATION      FAMHx:  Family History  Problem Relation Age of Onset  . Cancer Mother        cervical  . Diabetes Mother   . Hypertension Mother   . Arthritis Unknown   . Diabetes Unknown   . Hyperlipidemia Unknown   . Hypertension Unknown   . Coronary artery disease Maternal Grandfather   . Asthma Brother   . Cancer Maternal Aunt        colon    SOCHx:   reports that she has never smoked. She has never used smokeless tobacco. She reports that she drinks alcohol. She reports that she does not use drugs.  ALLERGIES:  No Known Allergies  ROS: Pertinent items noted in HPI and remainder of comprehensive ROS otherwise negative.  HOME MEDS: Current Outpatient Prescriptions on File Prior to Visit  Medication Sig Dispense Refill  . GLUTATHIONE PO Take 1 capsule (500 mg) by mouth daily    . Nutritional Supplements (GRAPESEED EXTRACT PO) Take by mouth daily.    . vitamin C (  ASCORBIC ACID) 500 MG tablet Take 500 mg by mouth daily.    . vitamin E 1000 UNIT capsule Take 1,000 Units by mouth daily.     No current facility-administered medications on file prior to visit.     LABS/IMAGING: No results found for this or any previous visit (from the past 48 hour(s)). No results found.  WEIGHTS: Wt Readings from Last 3 Encounters:  08/03/16 122 lb (55.3 kg)  06/19/16 125 lb (56.7 kg)  06/18/16 127 lb (57.6 kg)    VITALS: BP 111/79   Pulse 83   Ht 5\' 4"  (1.626 m)   Wt 122 lb (55.3 kg)   BMI 20.94 kg/m    EXAM: Deferred  EKG: Deferred  ASSESSMENT: 1. Vasovagal pre-syncope 2. Recurrent headaches, question migraine 3. Questionable history of pericardium cardiopathy with normal echo findings in 2014 - normal LVEF by echo in 07/2016  PLAN: 1.   [name] has not had any further presyncopal or syncopal events. She's managed to try to back off on caffeine in her diet. She still has some headaches which are likely migraine variants. Stress at work is playing a big role in her episodes. 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 We discussed management for vasovagal presyncope including hydration, salt intake, lower extremity compression stockings and awareness of the event.  Follow-up with me as needed.  Pixie Casino, MD, [facility] Attending Cardiologist [place] 08/03/2016, 8:45 AM

## 2016-08-03 NOTE — Patient Instructions (Signed)
Your physician recommends that you schedule a follow-up appointment as needed  

## 2016-12-31 ENCOUNTER — Other Ambulatory Visit (HOSPITAL_COMMUNITY)
Admission: RE | Admit: 2016-12-31 | Discharge: 2016-12-31 | Disposition: A | Discharge status: Home / Self Care | Payer: 59 | Source: Ambulatory Visit | Attending: Family Medicine | Admitting: Family Medicine

## 2016-12-31 ENCOUNTER — Ambulatory Visit (HOSPITAL_BASED_OUTPATIENT_CLINIC_OR_DEPARTMENT_OTHER)
Admission: RE | Admit: 2016-12-31 | Discharge: 2016-12-31 | Disposition: A | Discharge status: Home / Self Care | Payer: 59 | Source: Ambulatory Visit | Attending: Family Medicine | Admitting: Family Medicine

## 2016-12-31 ENCOUNTER — Other Ambulatory Visit: Payer: Self-pay | Admitting: Family Medicine

## 2016-12-31 ENCOUNTER — Encounter: Payer: Self-pay | Admitting: Family Medicine

## 2016-12-31 ENCOUNTER — Ambulatory Visit (INDEPENDENT_AMBULATORY_CARE_PROVIDER_SITE_OTHER): Payer: 59 | Admitting: Family Medicine

## 2016-12-31 VITALS — BP 128/90 | HR 80 | Temp 98.7°F | Ht 64.0 in | Wt 124.0 lb

## 2016-12-31 DIAGNOSIS — Z1231 Encounter for screening mammogram for malignant neoplasm of breast: Secondary | ICD-10-CM | POA: Insufficient documentation

## 2016-12-31 DIAGNOSIS — N6320 Unspecified lump in the left breast, unspecified quadrant: Secondary | ICD-10-CM | POA: Insufficient documentation

## 2016-12-31 DIAGNOSIS — Z0000 Encounter for general adult medical examination without abnormal findings: Secondary | ICD-10-CM

## 2016-12-31 DIAGNOSIS — Z1239 Encounter for other screening for malignant neoplasm of breast: Secondary | ICD-10-CM

## 2016-12-31 DIAGNOSIS — Z124 Encounter for screening for malignant neoplasm of cervix: Secondary | ICD-10-CM

## 2016-12-31 DIAGNOSIS — Z13220 Encounter for screening for lipoid disorders: Secondary | ICD-10-CM | POA: Diagnosis not present

## 2016-12-31 DIAGNOSIS — Z130 Encounter for screening for diseases of the blood and blood-forming organs and certain disorders involving the immune mechanism: Secondary | ICD-10-CM

## 2016-12-31 DIAGNOSIS — R928 Other abnormal and inconclusive findings on diagnostic imaging of breast: Secondary | ICD-10-CM | POA: Diagnosis not present

## 2016-12-31 DIAGNOSIS — Z9289 Personal history of other medical treatment: Secondary | ICD-10-CM

## 2016-12-31 DIAGNOSIS — Z131 Encounter for screening for diabetes mellitus: Secondary | ICD-10-CM | POA: Diagnosis not present

## 2016-12-31 LAB — LIPID PANEL
CHOLESTEROL: 199 mg/dL (ref 0–200)
HDL: 57.3 mg/dL (ref >39.00)
LDL CALC: 128 mg/dL — AB (ref 0–99)
NonHDL: 141.33
TRIGLYCERIDES: 68 mg/dL (ref 0.0–149.0)
Total CHOL/HDL Ratio: 3
VLDL: 13.6 mg/dL (ref 0.0–40.0)

## 2016-12-31 LAB — CBC
HEMATOCRIT: 37.1 % (ref 36.0–46.0)
HEMOGLOBIN: 12.4 g/dL (ref 12.0–15.0)
MCHC: 33.4 g/dL (ref 30.0–36.0)
MCV: 90.6 fl (ref 78.0–100.0)
Platelets: 256 10*3/uL (ref 150.0–400.0)
RBC: 4.1 Mil/uL (ref 3.87–5.11)
RDW: 13.5 % (ref 11.5–15.5)
WBC: 8.1 10*3/uL (ref 4.0–10.5)

## 2016-12-31 LAB — COMPREHENSIVE METABOLIC PANEL
ALBUMIN: 4.3 g/dL (ref 3.5–5.2)
ALK PHOS: 42 U/L (ref 39–117)
ALT: 7 U/L (ref 0–35)
AST: 11 U/L (ref 0–37)
BUN: 13 mg/dL (ref 6–23)
CHLORIDE: 104 meq/L (ref 96–112)
CO2: 30 mEq/L (ref 19–32)
Calcium: 8.5 mg/dL (ref 8.4–10.5)
Creatinine, Ser: 0.71 mg/dL (ref 0.40–1.20)
GFR: 96.66 mL/min (ref >60.00)
Glucose, Bld: 105 mg/dL — ABNORMAL HIGH (ref 70–99)
POTASSIUM: 3.9 meq/L (ref 3.5–5.1)
SODIUM: 138 meq/L (ref 135–145)
TOTAL PROTEIN: 7.4 g/dL (ref 6.0–8.3)
Total Bilirubin: 0.4 mg/dL (ref 0.2–1.2)

## 2016-12-31 LAB — HEMOGLOBIN A1C: Hgb A1c MFr Bld: 5.2 % (ref 4.6–6.5)

## 2016-12-31 NOTE — Patient Instructions (Signed)
It was nice to see you today!  I will be in touch with your labs and pap results asap Please do schedule a mammogram for the next few months For your neck, you might try heat, ibuprofen as needed.  Please let me know if this is not getting better in the next week or so  Health Maintenance, Female Adopting a healthy lifestyle and getting preventive care can go a long way to promote health and wellness. Talk with your health care provider about what schedule of regular examinations is right for you. This is a good chance for you to check in with your provider about disease prevention and staying healthy. In between checkups, there are plenty of things you can do on your own. Experts have done a lot of research about which lifestyle changes and preventive measures are most likely to keep you healthy. Ask your health care provider for more information. Weight and diet Eat a healthy diet  Be sure to include plenty of vegetables, fruits, low-fat dairy products, and lean protein.  Do not eat a lot of foods high in solid fats, added sugars, or salt.  Get regular exercise. This is one of the most important things you can do for your health. ? Most adults should exercise for at least 150 minutes each week. The exercise should increase your heart rate and make you sweat (moderate-intensity exercise). ? Most adults should also do strengthening exercises at least twice a week. This is in addition to the moderate-intensity exercise.  Maintain a healthy weight  Body mass index (BMI) is a measurement that can be used to identify possible weight problems. It estimates body fat based on height and weight. Your health care provider can help determine your BMI and help you achieve or maintain a healthy weight.  For females 23 years of age and older: ? A BMI below 18.5 is considered underweight. ? A BMI of 18.5 to 24.9 is normal. ? A BMI of 25 to 29.9 is considered overweight. ? A BMI of 30 and above is  considered obese.  Watch levels of cholesterol and blood lipids  You should start having your blood tested for lipids and cholesterol at 40 years of age, then have this test every 5 years.  You may need to have your cholesterol levels checked more often if: ? Your lipid or cholesterol levels are high. ? You are older than 40 years of age. ? You are at high risk for heart disease.  Cancer screening Lung Cancer  Lung cancer screening is recommended for adults 39-3 years old who are at high risk for lung cancer because of a history of smoking.  A yearly low-dose CT scan of the lungs is recommended for people who: ? Currently smoke. ? Have quit within the past 15 years. ? Have at least a 30-pack-year history of smoking. A pack year is smoking an average of one pack of cigarettes a day for 1 year.  Yearly screening should continue until it has been 15 years since you quit.  Yearly screening should stop if you develop a health problem that would prevent you from having lung cancer treatment.  Breast Cancer  Practice breast self-awareness. This means understanding how your breasts normally appear and feel.  It also means doing regular breast self-exams. Let your health care provider know about any changes, no matter how small.  If you are in your 20s or 30s, you should have a clinical breast exam (CBE) by a health care  provider every 1-3 years as part of a regular health exam.  If you are 44 or older, have a CBE every year. Also consider having a breast X-ray (mammogram) every year.  If you have a family history of breast cancer, talk to your health care provider about genetic screening.  If you are at high risk for breast cancer, talk to your health care provider about having an MRI and a mammogram every year.  Breast cancer gene (BRCA) assessment is recommended for women who have family members with BRCA-related cancers. BRCA-related cancers  include: ? Breast. ? Ovarian. ? Tubal. ? Peritoneal cancers.  Results of the assessment will determine the need for genetic counseling and BRCA1 and BRCA2 testing.  Cervical Cancer Your health care provider may recommend that you be screened regularly for cancer of the pelvic organs (ovaries, uterus, and vagina). This screening involves a pelvic examination, including checking for microscopic changes to the surface of your cervix (Pap test). You may be encouraged to have this screening done every 3 years, beginning at age 3.  For women ages 27-65, health care providers may recommend pelvic exams and Pap testing every 3 years, or they may recommend the Pap and pelvic exam, combined with testing for human papilloma virus (HPV), every 5 years. Some types of HPV increase your risk of cervical cancer. Testing for HPV may also be done on women of any age with unclear Pap test results.  Other health care providers may not recommend any screening for nonpregnant women who are considered low risk for pelvic cancer and who do not have symptoms. Ask your health care provider if a screening pelvic exam is right for you.  If you have had past treatment for cervical cancer or a condition that could lead to cancer, you need Pap tests and screening for cancer for at least 20 years after your treatment. If Pap tests have been discontinued, your risk factors (such as having a new sexual partner) need to be reassessed to determine if screening should resume. Some women have medical problems that increase the chance of getting cervical cancer. In these cases, your health care provider may recommend more frequent screening and Pap tests.  Colorectal Cancer  This type of cancer can be detected and often prevented.  Routine colorectal cancer screening usually begins at 40 years of age and continues through 40 years of age.  Your health care provider may recommend screening at an earlier age if you have risk factors  for colon cancer.  Your health care provider may also recommend using home test kits to check for hidden blood in the stool.  A small camera at the end of a tube can be used to examine your colon directly (sigmoidoscopy or colonoscopy). This is done to check for the earliest forms of colorectal cancer.  Routine screening usually begins at age 27.  Direct examination of the colon should be repeated every 5-10 years through 40 years of age. However, you may need to be screened more often if early forms of precancerous polyps or small growths are found.  Skin Cancer  Check your skin from head to toe regularly.  Tell your health care provider about any new moles or changes in moles, especially if there is a change in a mole's shape or color.  Also tell your health care provider if you have a mole that is larger than the size of a pencil eraser.  Always use sunscreen. Apply sunscreen liberally and repeatedly throughout the day.  Protect yourself by wearing long sleeves, pants, a wide-brimmed hat, and sunglasses whenever you are outside.  Heart disease, diabetes, and high blood pressure  High blood pressure causes heart disease and increases the risk of stroke. High blood pressure is more likely to develop in: ? People who have blood pressure in the high end of the normal range (130-139/85-89 mm Hg). ? People who are overweight or obese. ? People who are African American.  If you are 57-39 years of age, have your blood pressure checked every 3-5 years. If you are 19 years of age or older, have your blood pressure checked every year. You should have your blood pressure measured twice-once when you are at a hospital or clinic, and once when you are not at a hospital or clinic. Record the average of the two measurements. To check your blood pressure when you are not at a hospital or clinic, you can use: ? An automated blood pressure machine at a pharmacy. ? A home blood pressure monitor.  If  you are between 19 years and 38 years old, ask your health care provider if you should take aspirin to prevent strokes.  Have regular diabetes screenings. This involves taking a blood sample to check your fasting blood sugar level. ? If you are at a normal weight and have a low risk for diabetes, have this test once every three years after 40 years of age. ? If you are overweight and have a high risk for diabetes, consider being tested at a younger age or more often. Preventing infection Hepatitis B  If you have a higher risk for hepatitis B, you should be screened for this virus. You are considered at high risk for hepatitis B if: ? You were born in a country where hepatitis B is common. Ask your health care provider which countries are considered high risk. ? Your parents were born in a high-risk country, and you have not been immunized against hepatitis B (hepatitis B vaccine). ? You have HIV or AIDS. ? You use needles to inject street drugs. ? You live with someone who has hepatitis B. ? You have had sex with someone who has hepatitis B. ? You get hemodialysis treatment. ? You take certain medicines for conditions, including cancer, organ transplantation, and autoimmune conditions.  Hepatitis C  Blood testing is recommended for: ? Everyone born from [birth year] through 1964. ? Anyone with known risk factors for hepatitis C.  Sexually transmitted infections (STIs)  You should be screened for sexually transmitted infections (STIs) including gonorrhea and chlamydia if: ? You are sexually active and are younger than 40 years of age. ? You are older than 40 years of age and your health care provider tells you that you are at risk for this type of infection. ? Your sexual activity has changed since you were last screened and you are at an increased risk for chlamydia or gonorrhea. Ask your health care provider if you are at risk.  If you do not have HIV, but are at risk, it may be recommended  that you take a prescription medicine daily to prevent HIV infection. This is called pre-exposure prophylaxis (PrEP). You are considered at risk if: ? You are sexually active and do not regularly use condoms or know the HIV status of your partner(s). ? You take drugs by injection. ? You are sexually active with a partner who has HIV.  Talk with your health care provider about whether you are at high risk of  being infected with HIV. If you choose to begin PrEP, you should first be tested for HIV. You should then be tested every 3 months for as long as you are taking PrEP. Pregnancy  If you are premenopausal and you may become pregnant, ask your health care provider about preconception counseling.  If you may become pregnant, take 400 to 800 micrograms (mcg) of folic acid every day.  If you want to prevent pregnancy, talk to your health care provider about birth control (contraception). Osteoporosis and menopause  Osteoporosis is a disease in which the bones lose minerals and strength with aging. This can result in serious bone fractures. Your risk for osteoporosis can be identified using a bone density scan.  If you are 63 years of age or older, or if you are at risk for osteoporosis and fractures, ask your health care provider if you should be screened.  Ask your health care provider whether you should take a calcium or vitamin D supplement to lower your risk for osteoporosis.  Menopause may have certain physical symptoms and risks.  Hormone replacement therapy may reduce some of these symptoms and risks. Talk to your health care provider about whether hormone replacement therapy is right for you. Follow these instructions at home:  Schedule regular health, dental, and eye exams.  Stay current with your immunizations.  Do not use any tobacco products including cigarettes, chewing tobacco, or electronic cigarettes.  If you are pregnant, do not drink alcohol.  If you are  breastfeeding, limit how much and how often you drink alcohol.  Limit alcohol intake to no more than 1 drink per day for nonpregnant women. One drink equals 12 ounces of beer, 5 ounces of wine, or 1 ounces of hard liquor.  Do not use street drugs.  Do not share needles.  Ask your health care provider for help if you need support or information about quitting drugs.  Tell your health care provider if you often feel depressed.  Tell your health care provider if you have ever been abused or do not feel safe at home. This information is not intended to replace advice given to you by your health care provider. Make sure you discuss any questions you have with your health care provider. Document Released: 09/22/2010 Document Revised: 08/15/2015 Document Reviewed: 12/11/2014 Elsevier Interactive Patient Education  [employer].

## 2016-12-31 NOTE — Progress Notes (Addendum)
[place] [facility] [place] [address] [place], [place] [address] [phone]  Date:  12/31/2016   Name:  [name]   DOB:  December 01, 1975   MRN:  [id number]  PCP:  Darreld Mclean, MD    Chief Complaint: Torticollis (Right side neck pain and pain in the base of neck. Began after flu vaccine.) and Annual Exam   History of Present Illness:  [name] is a 41 y.o. very pleasant female patient who presents with the following:  Here today for a physical exam.  I last saw her in the spring of this year:  Here today to follow-up from ER visit on Monday (today is Thursday) for a near syncopal episode thought vagal in nature and related to diarrhea She is overall feeling better today, but also describes other symptoms including a feeling of vertigo, headaches, head pain, palpitations for a year or so. These are not acute or worse today Will refer to neurology and cardiology and follow-up on mild lab abnl as above She will alert me if any change or worsening of her sx in the meantime   Pap: last done a few years ago.  Never had an abnormal pap, will update today Mammo: not done yet, she will schedule Flu: done 2 weeks ago at her job Tetanus: done in 2011 Labs: will do today.  She did eat some rice/ soup this am LMP was on 10/5- she has regular menses   She is s/pt BTL Her children are 9, 6, and 5.    They are all doing well Natalie has noted some neck pain over the last couple of weeks- NKI. She feels what seems to be a muscle strain in her right posterior neck Otherwise she is feeling well Her weight is normal Never a smoker Patient Active Problem List   Diagnosis Date Noted  . Palpitations 08/03/2016  . Common migraine with intractable migraine 06/19/2016  . Vasovagal near syncope 06/18/2016  . Pre-syncope 06/18/2016  . Dizziness 04/08/2012  . Anemia associated with acute blood loss--due to adhesions and C/S  11/14/2011  . Status post repeat low transverse cesarean section and BTL 11/11/2011  . Pregnancy with history of caesarean section, antepartum 07/09/2011  . Menstrual cycle disorder 07/09/2011  . Gestational diabetes mellitus in pregnancy 07/09/2011  . AMA (advanced maternal age) multigravida 35+ 07/09/2011    Past Medical History:  Diagnosis Date  . History of chicken pox   . History of gestational diabetes   . History of measles, mumps, or rubella   . No pertinent past medical history     Past Surgical History:  Procedure Laterality Date  . BREAST CYST EXCISION  2006  . CESAREAN SECTION    . TUBAL LIGATION      Social History  Substance Use Topics  . Smoking status: Never Smoker  . Smokeless tobacco: Never Used  . Alcohol use Yes     Comment: Rarely- red wine    Family History  Problem Relation Age of Onset  . Cancer Mother        cervical  . Diabetes Mother   . Hypertension Mother   . Arthritis Unknown   . Diabetes Unknown   . Hyperlipidemia Unknown   . Hypertension Unknown   . Coronary artery disease Maternal Grandfather   . Asthma Brother   . Cancer Maternal Aunt        colon    No  Known Allergies  Medication list has been reviewed and updated.  Current Outpatient Prescriptions on File Prior to Visit  Medication Sig Dispense Refill  . GLUTATHIONE PO Take 1 capsule (500 mg) by mouth daily    . Nutritional Supplements (GRAPESEED EXTRACT PO) Take by mouth daily.    . vitamin C (ASCORBIC ACID) 500 MG tablet Take 500 mg by mouth daily.    . vitamin E 1000 UNIT capsule Take 1,000 Units by mouth daily.     No current facility-administered medications on file prior to visit.     Review of Systems:  As per HPI- otherwise negative. No fever or chills No CP or SOB She does have some itching under her arms,but this seems to be getting better. She thinks she has some irritation from shaving    Physical Examination: Vitals:   12/31/16 0926  BP: 128/90   Pulse: 80  Temp: 98.7 F (37.1 C)  SpO2: 99%   Vitals:   12/31/16 0926  Weight: 124 lb (56.2 kg)  Height: 5\' 4"  (1.626 m)   Body mass index is 21.28 kg/m. Ideal Body Weight: Weight in (lb) to have BMI = 25: 145.3  GEN: WDWN, NAD, Non-toxic, A & O x 3, normal weight, looks well HEENT: Atraumatic, Normocephalic. Neck supple. No masses, No LAD.  Bilateral TM wnl, oropharynx normal.  PEERL,EOMI.   Ears and Nose: No external deformity. CV: RRR, No M/G/R. No JVD. No thrill. No extra heart sounds. PULM: CTA B, no wheezes, crackles, rhonchi. No retractions. No resp. distress. No accessory muscle use. ABD: S, NT, ND, +BS. No rebound. No HSM. EXTR: No c/c/e NEURO Normal gait.  PSYCH: Normally interactive. Conversant. Not depressed or anxious appearing.  Calm demeanor.  Breast: normal exam, no masses/ dimpling/ discharge Pelvic: normal, no vaginal lesions or discharge. Uterus normal, no CMT, no adnexal tendereness or masses    Assessment and Plan: Physical exam  Screening for deficiency anemia - Plan: CBC  Screening for diabetes mellitus - Plan: Comprehensive metabolic panel, Hemoglobin A1c  Screening for hyperlipidemia - Plan: Lipid panel  Screening for cervical cancer - Plan: Cytology - PAP  Screening for breast cancer  Here today for a CPE Labs and pap pending Anticipatory guidance provided Will plan further follow- up pending labs.  Signed Lamar Blinks, MD  received her labs so far, message to pt  Results for orders placed or performed in visit on 12/31/16  CBC  Result Value Ref Range   WBC 8.1 4.0 - 10.5 K/uL   RBC 4.10 3.87 - 5.11 Mil/uL   Platelets 256.0 150.0 - 400.0 K/uL   Hemoglobin 12.4 12.0 - 15.0 g/dL   HCT 37.1 36.0 - 46.0 %   MCV 90.6 78.0 - 100.0 fl   MCHC 33.4 30.0 - 36.0 g/dL   RDW 13.5 11.5 - 15.5 %  Comprehensive metabolic panel  Result Value Ref Range   Sodium 138 135 - 145 mEq/L   Potassium 3.9 3.5 - 5.1 mEq/L   Chloride 104 96 - 112  mEq/L   CO2 30 19 - 32 mEq/L   Glucose, Bld 105 (H) 70 - 99 mg/dL   BUN 13 6 - 23 mg/dL   Creatinine, Ser 0.71 0.40 - 1.20 mg/dL   Total Bilirubin 0.4 0.2 - 1.2 mg/dL   Alkaline Phosphatase 42 39 - 117 U/L   AST 11 0 - 37 U/L   ALT 7 0 - 35 U/L   Total Protein 7.4 6.0 - 8.3 g/dL  Albumin 4.3 3.5 - 5.2 g/dL   Calcium 8.5 8.4 - 10.5 mg/dL   GFR 96.66 >60.00 mL/min  Lipid panel  Result Value Ref Range   Cholesterol 199 0 - 200 mg/dL   Triglycerides 68.0 0.0 - 149.0 mg/dL   HDL 57.30 >39.00 mg/dL   VLDL 13.6 0.0 - 40.0 mg/dL   LDL Cholesterol 128 (H) 0 - 99 mg/dL   Total CHOL/HDL Ratio 3    NonHDL 141.33   Hemoglobin A1c  Result Value Ref Range   Hgb A1c MFr Bld 5.2 4.6 - 6.5 %

## 2017-01-04 ENCOUNTER — Other Ambulatory Visit: Payer: Self-pay | Admitting: Family Medicine

## 2017-01-04 DIAGNOSIS — R928 Other abnormal and inconclusive findings on diagnostic imaging of breast: Secondary | ICD-10-CM

## 2017-01-05 LAB — CYTOLOGY - PAP
Diagnosis: NEGATIVE
HPV: NOT DETECTED

## 2017-01-07 ENCOUNTER — Ambulatory Visit
Admission: RE | Admit: 2017-01-07 | Discharge: 2017-01-07 | Disposition: A | Discharge status: Home / Self Care | Payer: 59 | Source: Ambulatory Visit | Attending: Family Medicine | Admitting: Family Medicine

## 2017-01-07 ENCOUNTER — Other Ambulatory Visit: Payer: Self-pay | Admitting: Family Medicine

## 2017-01-07 ENCOUNTER — Ambulatory Visit: Payer: Self-pay

## 2017-01-07 DIAGNOSIS — R928 Other abnormal and inconclusive findings on diagnostic imaging of breast: Secondary | ICD-10-CM

## 2017-01-07 DIAGNOSIS — R922 Inconclusive mammogram: Secondary | ICD-10-CM | POA: Diagnosis not present

## 2017-05-04 ENCOUNTER — Ambulatory Visit: Payer: 59 | Admitting: Internal Medicine

## 2017-05-04 ENCOUNTER — Encounter: Payer: Self-pay | Admitting: Internal Medicine

## 2017-05-04 VITALS — BP 116/76 | HR 76 | Temp 97.6°F | Resp 14 | Ht 64.0 in | Wt 127.1 lb

## 2017-05-04 DIAGNOSIS — B349 Viral infection, unspecified: Secondary | ICD-10-CM | POA: Diagnosis not present

## 2017-05-04 MED ORDER — OSELTAMIVIR PHOSPHATE 75 MG PO CAPS: 75.0000 mg | ORAL_CAPSULE | Freq: Two times a day (BID) | ORAL | 0 refills | Status: DC

## 2017-05-04 MED FILL — OSELTAMIVIR PHOSPHATE 75 MG: 75 | 5 days supply | Qty: 10 | Fill #0

## 2017-05-04 NOTE — Progress Notes (Signed)
Pre visit review using our clinic review tool, if applicable. No additional management support is needed unless otherwise documented below in the visit note. 

## 2017-05-04 NOTE — Patient Instructions (Signed)
Rest, fluids , tylenol  For cough:  Take Mucinex DM twice a day as needed until better  For nasal congestion: Use OTC Nasocort or Flonase : 2 nasal sprays on each side of the nose in the morning until you feel better   Take TAMIFLU as prescribed     Call if not gradually better over the next  10 days  Call anytime if the symptoms are severe    Influenza, Adult Influenza, more commonly known as "the flu," is a viral infection that primarily affects the respiratory tract. The respiratory tract includes organs that help you breathe, such as the lungs, nose, and throat. The flu causes many common cold symptoms, as well as a high fever and body aches. The flu spreads easily from person to person (is contagious). Getting a flu shot (influenza vaccination) every year is the best way to prevent influenza. What are the causes? Influenza is caused by a virus. You can catch the virus by:  Breathing in droplets from an infected person's cough or sneeze.  Touching something that was recently contaminated with the virus and then touching your mouth, nose, or eyes.  What increases the risk? The following factors may make you more likely to get the flu:  Not cleaning your hands frequently with soap and water or alcohol-based hand sanitizer.  Having close contact with many people during cold and flu season.  Touching your mouth, eyes, or nose without washing or sanitizing your hands first.  Not drinking enough fluids or not eating a healthy diet.  Not getting enough sleep or exercise.  Being under a high amount of stress.  Not getting a yearly (annual) flu shot.  You may be at a higher risk of complications from the flu, such as a severe lung infection (pneumonia), if you:  Are over the age of 75.  Are pregnant.  Have a weakened disease-fighting system (immune system). You may have a weakened immune system if you: ? Have HIV or AIDS. ? Are undergoing chemotherapy. ? Aretaking  medicines that reduce the activity of (suppress) the immune system.  Have a long-term (chronic) illness, such as heart disease, kidney disease, diabetes, or lung disease.  Have a liver disorder.  Are obese.  Have anemia.  What are the signs or symptoms? Symptoms of this condition typically last 4-10 days and may include:  Fever.  Chills.  Headache, body aches, or muscle aches.  Sore throat.  Cough.  Runny or congested nose.  Chest discomfort and cough.  Poor appetite.  Weakness or tiredness (fatigue).  Dizziness.  Nausea or vomiting.  How is this diagnosed? This condition may be diagnosed based on your medical history and a physical exam. Your health care provider may do a nose or throat swab test to confirm the diagnosis. How is this treated? If influenza is detected early, you can be treated with antiviral medicine that can reduce the length of your illness and the severity of your symptoms. This medicine may be given by mouth (orally) or through an IV tube that is inserted in one of your veins. The goal of treatment is to relieve symptoms by taking care of yourself at home. This may include taking over-the-counter medicines, drinking plenty of fluids, and adding humidity to the air in your home. In some cases, influenza goes away on its own. Severe influenza or complications from influenza may be treated in a hospital. Follow these instructions at home:  Take over-the-counter and prescription medicines only as told by  your health care provider.  Use a cool mist humidifier to add humidity to the air in your home. This can make breathing easier.  Rest as needed.  Drink enough fluid to keep your urine clear or pale yellow.  Cover your mouth and nose when you cough or sneeze.  Wash your hands with soap and water often, especially after you cough or sneeze. If soap and water are not available, use hand sanitizer.  Stay home from work or school as told by your  health care provider. Unless you are visiting your health care provider, try to avoid leaving home until your fever has been gone for 24 hours without the use of medicine.  Keep all follow-up visits as told by your health care provider. This is important. How is this prevented?  Getting an annual flu shot is the best way to avoid getting the flu. You may get the flu shot in late summer, fall, or winter. Ask your health care provider when you should get your flu shot.  Wash your hands often or use hand sanitizer often.  Avoid contact with people who are sick during cold and flu season.  Eat a healthy diet, drink plenty of fluids, get enough sleep, and exercise regularly. Contact a health care provider if:  You develop new symptoms.  You have: ? Chest pain. ? Diarrhea. ? A fever.  Your cough gets worse.  You produce more mucus.  You feel nauseous or you vomit. Get help right away if:  You develop shortness of breath or difficulty breathing.  Your skin or nails turn a bluish color.  You have severe pain or stiffness in your neck.  You develop a sudden headache or sudden pain in your face or ear.  You cannot stop vomiting. This information is not intended to replace advice given to you by your health care provider. Make sure you discuss any questions you have with your health care provider. Document Released: 03/06/2000 Document Revised: 08/15/2015 Document Reviewed: 01/01/2015 Elsevier Interactive Patient Education  2017 [employer].

## 2017-05-04 NOTE — Progress Notes (Signed)
Subjective:    Patient ID: [name], female    DOB: 11-10-1975, 42 y.o.   MRN: [id number]  DOS:  05/04/2017 Type of visit - description : Acute Interval history: Symptoms started yesterday: Developed fever of 102.0, took Tylenol and the fever decreased. Then started to cough, + nasal congestion and moderate to severe generalized body aches. She had some chills. Her child is sick with similar symptoms, having fever as well.   Review of Systems  Denies nausea, vomiting, diarrhea.  Has mild headache.  No rash Past Medical History:  Diagnosis Date  . History of chicken pox   . History of gestational diabetes   . History of measles, mumps, or rubella   . No pertinent past medical history     Past Surgical History:  Procedure Laterality Date  . BREAST CYST EXCISION  2006  . CESAREAN SECTION    . TUBAL LIGATION      Social History   Socioeconomic History  . Marital status: Married    Spouse name: Theressa Millard  . Number of children: 3  . Years of education: BA Therapist, sports  . Highest education level: Not on file  Social Needs  . Financial resource strain: Not on file  . Food insecurity - worry: Not on file  . Food insecurity - inability: Not on file  . Transportation needs - medical: Not on file  . Transportation needs - non-medical: Not on file  Occupational History  . 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 Smoking status: Never Smoker  . Smokeless tobacco: Never Used  Substance and Sexual Activity  . Alcohol use: Yes    Comment: Rarely- red wine  . Drug use: No  . Sexual activity: Yes    Birth control/protection: Abstinence, Surgical    Comment: BTL   Other Topics Concern  . Not on file  Social History Narrative   Lives with husband and 3 kids   Caffeine use: Drinks 5 glasses of coffee per day or green tea   Right handed      Allergies as of 05/04/2017   No Known Allergies     Medication List        Accurate as of 05/04/17  6:12 PM. Always  use your most recent med list.          GLUTATHIONE PO Take 1 capsule (500 mg) by mouth daily   GRAPESEED EXTRACT PO Take by mouth daily.   oseltamivir 75 MG capsule Commonly known as:  TAMIFLU Take 1 capsule (75 mg total) by mouth 2 (two) times daily.   vitamin C 500 MG tablet Commonly known as:  ASCORBIC ACID Take 500 mg by mouth daily.   vitamin E 1000 UNIT capsule Take 1,000 Units by mouth daily.          Objective:   Physical Exam BP 116/76 (BP Location: Left Arm, Patient Position: Sitting, Cuff Size: Small)   Pulse 76   Temp 97.6 F (36.4 C) (Oral)   Resp 14   Ht 5\' 4"  (1.626 m)   Wt 127 lb 2 oz (57.7 kg)   LMP 04/13/2017 (Approximate)   SpO2 96%   BMI 21.82 kg/m  General:   Well developed, well nourished . NAD.  HEENT:  Normocephalic . Face symmetric, atraumatic. Nose congested, TMs normal, throat symmetric and not red. Neck: Several small LADs, there is one on the left side that is 1.5 cm, mobile, slightly tender. Lungs:  CTA B Normal  respiratory effort, no intercostal retractions, no accessory muscle use. Heart: RRR,  no murmur.  No pretibial edema bilaterally  Skin: Not pale. Not jaundice Neurologic:  alert & oriented X3.  Speech normal, gait appropriate for age and unassisted Psych--  Cognition and judgment appear intact.  Cooperative with normal attention span and concentration.  Behavior appropriate. No anxious or depressed appearing.      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  Viral syndrome Sx c/w viral syndrome, child is sick with similar illness, multiple cases of influenza in the community, likely she has influenza. Recommend supportive care and Tamiflu.  She is aware that influenza is not just a cold and has the potential to get worse. Recommend to monitor the lymph nodes at the left side of the neck, call if not gradually improving. 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

## 2017-09-03 ENCOUNTER — Encounter: Payer: Self-pay | Admitting: Family Medicine

## 2017-09-03 ENCOUNTER — Telehealth: Payer: Self-pay

## 2017-09-03 NOTE — Telephone Encounter (Signed)
Author phoned pt. Re: pt-reported blurry vision X 6 months. Author left detailed VM, advising pt. to make an appointment with Dr. Lorelei Pont first, and she can discuss need for opthamology consult if needed. Call back number provided [phone]. OK for PEC to schedule.

## 2017-09-14 ENCOUNTER — Encounter: Payer: Self-pay | Admitting: Family Medicine

## 2017-10-19 MED FILL — ERYTHROMYCIN EYE OINTMENT: 5 | 14 days supply | Qty: 4 | Fill #0

## 2017-12-18 NOTE — Progress Notes (Addendum)
[facility] [place], [place],  [address] [phone]  Date:  12/22/2017   Name:  [name]   DOB:  10/17/1975   MRN:  [id number]  PCP:  Darreld Mclean, MD    Chief Complaint: Vision Problems (trouble seeing, blurry vision, 8 months, does not see eye specialist) and Annual Exam   History of Present Illness:  [name] is a 42 y.o. very pleasant female patient who presents with the following:  Here today with a vision concern History of GDM but more recent A1c was normal  I last saw her for a CPE about one year ago:  She is s/p BTL Her children are 9, 6, and 5.    They are all doing well Natalie has noted some neck pain over the last couple of weeks- NKI. She feels what seems to be a muscle strain in her right posterior neck Otherwise she is feeling well Her weight is normal Never a smoker  Flu: she had at work already   She has noted some difficulty with near vision for about 8 months- she finds herself holding reading materials father away from her face in order to see better  This is present in both eyes She does not wear any lenses She had a formal eye exam 3-4 years ago   No eye pain, crusting, injury, etc She does get headaches sometimes she thinks related to her eyes/ "tension headaches"  Lab Results  Component Value Date   HGBA1C 5.2 12/31/2016     Patient Active Problem List   Diagnosis Date Noted  . Palpitations 08/03/2016  . Common migraine with intractable migraine 06/19/2016  . Vasovagal near syncope 06/18/2016  . Pre-syncope 06/18/2016  . Dizziness 04/08/2012  . Anemia associated with acute blood loss--due to adhesions and C/S 11/14/2011  . Status post repeat low transverse cesarean section and BTL 11/11/2011  . Pregnancy with history of caesarean section, antepartum 07/09/2011  . Menstrual cycle disorder 07/09/2011  . Gestational diabetes mellitus in pregnancy  07/09/2011  . AMA (advanced maternal age) multigravida 35+ 07/09/2011    Past Medical History:  Diagnosis Date  . History of chicken pox   . History of gestational diabetes   . History of measles, mumps, or rubella   . No pertinent past medical history     Past Surgical History:  Procedure Laterality Date  . BREAST CYST EXCISION  2006  . CESAREAN SECTION    . TUBAL LIGATION      Social History   Tobacco Use  . Smoking status: Never Smoker  . Smokeless tobacco: Never Used  Substance Use Topics  . Alcohol use: Yes    Comment: Rarely- red wine  . Drug use: No    Family History  Problem Relation Age of Onset  . Cancer Mother        cervical  . Diabetes Mother   . Hypertension Mother   . Arthritis Unknown   . Diabetes Unknown   . Hyperlipidemia Unknown   . Hypertension Unknown   . Coronary artery disease Maternal Grandfather   . Asthma Brother   . Cancer Maternal Aunt        colon    No Known Allergies  Medication list has been reviewed and updated.  Current Outpatient Medications on File Prior to Visit  Medication Sig Dispense Refill  . GLUTATHIONE PO Take 1 capsule (500 mg) by mouth  daily    . Nutritional Supplements (GRAPESEED EXTRACT PO) Take by mouth daily.    Marland Kitchen oseltamivir (TAMIFLU) 75 MG capsule Take 1 capsule (75 mg total) by mouth 2 (two) times daily. 10 capsule 0  . vitamin C (ASCORBIC ACID) 500 MG tablet Take 500 mg by mouth daily.    . vitamin E 1000 UNIT capsule Take 1,000 Units by mouth daily.     No current facility-administered medications on file prior to visit.     Review of Systems:  As per HPI- otherwise negative. No fever or chills No CP or SOB   Physical Examination: Vitals:   12/22/17 0921  BP: 118/70  Pulse: 71  Resp: 16  Temp: 97.6 F (36.4 C)  SpO2: 99%   Vitals:   12/22/17 0921  Weight: 129 lb (58.5 kg)  Height: 5\' 4"  (1.626 m)   Body mass index is 22.14 kg/m. Ideal Body Weight: Weight in (lb) to have BMI = 25:  145.3  GEN: WDWN, NAD, Non-toxic, A & O x 3, normal weight, looks well  HEENT: Atraumatic, Normocephalic. Neck supple. No masses, No LAD. Bilateral TM wnl, oropharynx normal.  PEERL,EOMI.  Limited fundoscopic exam wnl  Ears and Nose: No external deformity. CV: RRR, No M/G/R. No JVD. No thrill. No extra heart sounds. PULM: CTA B, no wheezes, crackles, rhonchi. No retractions. No resp. distress. No accessory muscle use. EXTR: No c/c/e NEURO Normal gait.  PSYCH: Normally interactive. Conversant. Not depressed or anxious appearing.  Calm demeanor.    Assessment and Plan: Vision changes - Plan: Comprehensive metabolic panel, Hemoglobin A1c  Screening for deficiency anemia - Plan: CBC  Screening for diabetes mellitus - Plan: Comprehensive metabolic panel, Hemoglobin A1c  Screening for hyperlipidemia - Plan: Lipid panel  Screening for breast cancer - Plan: MM DIAG BREAST TOMO BILATERAL  Likely age related 39.  She will seek out an optometry evaluation Ordered routine labs for her as well ordered mammogram   Signed Lamar Blinks, MD Received her labs, message to pt  Results for orders placed or performed in visit on 12/22/17  CBC  Result Value Ref Range   WBC 9.1 4.0 - 10.5 K/uL   RBC 4.37 3.87 - 5.11 Mil/uL   Platelets 205.0 150.0 - 400.0 K/uL   Hemoglobin 13.5 12.0 - 15.0 g/dL   HCT 39.1 36.0 - 46.0 %   MCV 89.4 78.0 - 100.0 fl   MCHC 34.5 30.0 - 36.0 g/dL   RDW 13.2 11.5 - 15.5 %  Comprehensive metabolic panel  Result Value Ref Range   Sodium 137 135 - 145 mEq/L   Potassium 4.3 3.5 - 5.1 mEq/L   Chloride 103 96 - 112 mEq/L   CO2 26 19 - 32 mEq/L   Glucose, Bld 89 70 - 99 mg/dL   BUN 11 6 - 23 mg/dL   Creatinine, Ser 0.80 0.40 - 1.20 mg/dL   Total Bilirubin 0.8 0.2 - 1.2 mg/dL   Alkaline Phosphatase 40 39 - 117 U/L   AST 12 0 - 37 U/L   ALT 8 0 - 35 U/L   Total Protein 7.9 6.0 - 8.3 g/dL   Albumin 4.7 3.5 - 5.2 g/dL   Calcium 9.3 8.4 - 10.5 mg/dL    GFR 83.81 >60.00 mL/min  Hemoglobin A1c  Result Value Ref Range   Hgb A1c MFr Bld 5.2 4.6 - 6.5 %  Lipid panel  Result Value Ref Range   Cholesterol 200 0 - 200 mg/dL  Triglycerides 79.0 0.0 - 149.0 mg/dL   HDL 50.10 >39.00 mg/dL   VLDL 15.8 0.0 - 40.0 mg/dL   LDL Cholesterol 134 (H) 0 - 99 mg/dL   Total CHOL/HDL Ratio 4    NonHDL 150.14

## 2017-12-22 ENCOUNTER — Ambulatory Visit (INDEPENDENT_AMBULATORY_CARE_PROVIDER_SITE_OTHER): Payer: 59 | Admitting: Family Medicine

## 2017-12-22 ENCOUNTER — Encounter: Payer: Self-pay | Admitting: Family Medicine

## 2017-12-22 VITALS — BP 118/70 | HR 71 | Temp 97.6°F | Resp 16 | Ht 64.0 in | Wt 129.0 lb

## 2017-12-22 DIAGNOSIS — H539 Unspecified visual disturbance: Secondary | ICD-10-CM

## 2017-12-22 DIAGNOSIS — Z130 Encounter for screening for diseases of the blood and blood-forming organs and certain disorders involving the immune mechanism: Secondary | ICD-10-CM

## 2017-12-22 DIAGNOSIS — Z131 Encounter for screening for diabetes mellitus: Secondary | ICD-10-CM | POA: Diagnosis not present

## 2017-12-22 DIAGNOSIS — Z13220 Encounter for screening for lipoid disorders: Secondary | ICD-10-CM

## 2017-12-22 DIAGNOSIS — Z1239 Encounter for other screening for malignant neoplasm of breast: Secondary | ICD-10-CM | POA: Diagnosis not present

## 2017-12-22 LAB — COMPREHENSIVE METABOLIC PANEL
ALT: 8 U/L (ref 0–35)
AST: 12 U/L (ref 0–37)
Albumin: 4.7 g/dL (ref 3.5–5.2)
Alkaline Phosphatase: 40 U/L (ref 39–117)
BILIRUBIN TOTAL: 0.8 mg/dL (ref 0.2–1.2)
BUN: 11 mg/dL (ref 6–23)
CHLORIDE: 103 meq/L (ref 96–112)
CO2: 26 meq/L (ref 19–32)
Calcium: 9.3 mg/dL (ref 8.4–10.5)
Creatinine, Ser: 0.8 mg/dL (ref 0.40–1.20)
GFR: 83.81 mL/min (ref >60.00)
Glucose, Bld: 89 mg/dL (ref 70–99)
Potassium: 4.3 mEq/L (ref 3.5–5.1)
Sodium: 137 mEq/L (ref 135–145)
Total Protein: 7.9 g/dL (ref 6.0–8.3)

## 2017-12-22 LAB — LIPID PANEL
Cholesterol: 200 mg/dL (ref 0–200)
HDL: 50.1 mg/dL (ref >39.00)
LDL CALC: 134 mg/dL — AB (ref 0–99)
NonHDL: 150.14
TRIGLYCERIDES: 79 mg/dL (ref 0.0–149.0)
Total CHOL/HDL Ratio: 4
VLDL: 15.8 mg/dL (ref 0.0–40.0)

## 2017-12-22 LAB — CBC
HCT: 39.1 % (ref 36.0–46.0)
HEMOGLOBIN: 13.5 g/dL (ref 12.0–15.0)
MCHC: 34.5 g/dL (ref 30.0–36.0)
MCV: 89.4 fl (ref 78.0–100.0)
Platelets: 205 10*3/uL (ref 150.0–400.0)
RBC: 4.37 Mil/uL (ref 3.87–5.11)
RDW: 13.2 % (ref 11.5–15.5)
WBC: 9.1 10*3/uL (ref 4.0–10.5)

## 2017-12-22 LAB — HEMOGLOBIN A1C: Hgb A1c MFr Bld: 5.2 % (ref 4.6–6.5)

## 2017-12-22 NOTE — Patient Instructions (Signed)
Good to see you today!  We will get your routine labs and you can stop by the imaging dept on the ground floor for your mammogram (or at least to schedule this)  Please see an optometrist for an eye exam- you likely need some glasses for reading.  If they report that your vision is ok please do let me know however!

## 2018-01-01 ENCOUNTER — Encounter (HOSPITAL_BASED_OUTPATIENT_CLINIC_OR_DEPARTMENT_OTHER): Payer: Self-pay

## 2018-01-01 ENCOUNTER — Ambulatory Visit (HOSPITAL_BASED_OUTPATIENT_CLINIC_OR_DEPARTMENT_OTHER)
Admission: RE | Admit: 2018-01-01 | Discharge: 2018-01-01 | Disposition: A | Discharge status: Home / Self Care | Payer: 59 | Source: Ambulatory Visit | Attending: Family Medicine | Admitting: Family Medicine

## 2018-01-01 DIAGNOSIS — Z1239 Encounter for other screening for malignant neoplasm of breast: Secondary | ICD-10-CM | POA: Insufficient documentation

## 2018-01-01 DIAGNOSIS — Z1231 Encounter for screening mammogram for malignant neoplasm of breast: Secondary | ICD-10-CM | POA: Diagnosis not present

## 2018-07-21 ENCOUNTER — Encounter: Payer: Self-pay | Admitting: Emergency Medicine

## 2018-07-21 ENCOUNTER — Emergency Department
Admission: EM | Admit: 2018-07-21 | Discharge: 2018-07-21 | Disposition: A | Discharge status: Home / Self Care | Payer: 59 | Source: Home / Self Care | Attending: Family Medicine | Admitting: Family Medicine

## 2018-07-21 ENCOUNTER — Other Ambulatory Visit: Payer: Self-pay

## 2018-07-21 ENCOUNTER — Emergency Department (INDEPENDENT_AMBULATORY_CARE_PROVIDER_SITE_OTHER): Payer: 59

## 2018-07-21 DIAGNOSIS — M7581 Other shoulder lesions, right shoulder: Secondary | ICD-10-CM

## 2018-07-21 DIAGNOSIS — M25511 Pain in right shoulder: Secondary | ICD-10-CM | POA: Diagnosis not present

## 2018-07-21 DIAGNOSIS — R002 Palpitations: Secondary | ICD-10-CM

## 2018-07-21 NOTE — ED Provider Notes (Signed)
[name]    CSN: [id number] Arrival date & time: 07/21/18  1330     History   Chief Complaint Chief Complaint  Patient presents with  . Shoulder Pain  . Chest Pain    HPI [name] is a 43 y.o. female.   Patient complains of 3 week history of pain in her left shoulder, and now is unable to abduct her right arm above horizontal.  She recalls no injury.  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 As a result of the pain she has become more anxious, sometimes feeling nausea and palpitations.  She denies chest pain.  The history is provided by the patient.  Shoulder Pain  Location:  Shoulder Shoulder location:  R shoulder Injury: no   Pain details:    Quality:  Aching   Radiates to:  Does not radiate   Severity:  Moderate   Onset quality:  Gradual   Duration:  3 weeks   Timing:  Constant   Progression:  Worsening Handedness:  Right-handed Prior injury to area:  No Relieved by:  Nothing Worsened by:  Movement Ineffective treatments:  None tried Associated symptoms: decreased range of motion and muscle weakness   Associated symptoms: no back pain, no fatigue, no fever, no neck pain, no numbness, no stiffness, no swelling and no tingling     Past Medical History:  Diagnosis Date  . History of chicken pox   . History of gestational diabetes   . History of measles, mumps, or rubella   . No pertinent past medical history     Patient Active Problem List   Diagnosis Date Noted  . Palpitations 08/03/2016  . Common migraine with intractable migraine 06/19/2016  . Vasovagal near syncope 06/18/2016  . Pre-syncope 06/18/2016  . Dizziness 04/08/2012  . Anemia associated with acute blood loss--due to adhesions and C/S 11/14/2011  . Status post repeat low transverse cesarean section and BTL 11/11/2011  . Pregnancy with history of caesarean section, antepartum 07/09/2011  .  Menstrual cycle disorder 07/09/2011  . Gestational diabetes mellitus in pregnancy 07/09/2011  . AMA (advanced maternal age) multigravida 35+ 07/09/2011    Past Surgical History:  Procedure Laterality Date  . BREAST CYST EXCISION  2006  . CESAREAN SECTION    . TUBAL LIGATION      OB History    Gravida  3   Para  3   Term  3   Preterm      AB      Living  3     SAB      TAB      Ectopic      Multiple      Live Births  3            Home Medications    Prior to Admission medications   Medication Sig Start Date End Date Taking? Authorizing Provider  ibuprofen (ADVIL) 200 MG tablet Take 200 mg by mouth every 6 (six) hours as needed.   Yes [provider]  GLUTATHIONE PO Take 1 capsule (500 mg) by mouth daily    [provider]  Nutritional Supplements (GRAPESEED EXTRACT PO) Take by mouth daily.    [provider]  vitamin C (ASCORBIC ACID) 500 MG tablet Take 500 mg by mouth daily.    [provider]  vitamin E 1000 UNIT capsule Take 1,000 Units by mouth daily.  [provider]    Family History Family History  Problem Relation Age of Onset  . Cancer Mother        cervical  . Diabetes Mother   . Hypertension Mother   . Arthritis Other   . Diabetes Other   . Hyperlipidemia Other   . Hypertension Other   . Coronary artery disease Maternal Grandfather   . Asthma Brother   . Cancer Maternal Aunt        colon    Social History Social History   Tobacco Use  . Smoking status: Never Smoker  . Smokeless tobacco: Never Used  Substance Use Topics  . Alcohol use: Yes    Comment: Rarely- red wine  . Drug use: No     Allergies   Patient has no known allergies.   Review of Systems Review of Systems  Constitutional: Negative for activity change, appetite change, chills, diaphoresis, fatigue and fever.  HENT: Negative.   Eyes: Negative.   Respiratory: Negative for cough, chest tightness, shortness of  breath, wheezing and stridor.   Cardiovascular: Positive for palpitations. Negative for chest pain and leg swelling.  Gastrointestinal: Positive for nausea. Negative for abdominal pain and vomiting.  Genitourinary: Negative.   Musculoskeletal: Negative for back pain, neck pain and stiffness.  Neurological: Positive for dizziness. Negative for headaches.     Physical Exam Triage Vital Signs ED Triage Vitals  Enc Vitals Group     BP 07/21/18 1447 124/84     Pulse Rate 07/21/18 1447 66     Resp --      Temp 07/21/18 1447 97.8 F (36.6 C)     Temp Source 07/21/18 1447 Oral     SpO2 07/21/18 1447 99 %     Weight 07/21/18 1449 123 lb (55.8 kg)     Height 07/21/18 1449 5\' 4"  (1.626 m)     Head Circumference --      Peak Flow --      Pain Score 07/21/18 1449 6     Pain Loc --      Pain Edu? --      Excl. in [place]? --    No data found.  Updated Vital Signs BP 124/84 (BP Location: Right Arm)   Pulse 66   Temp 97.8 F (36.6 C) (Oral)   Ht 5\' 4"  (1.626 m)   Wt 55.8 kg   LMP 07/07/2018 (Approximate)   SpO2 99%   BMI 21.11 kg/m   Visual Acuity Right Eye Distance:   Left Eye Distance:   Bilateral Distance:    Right Eye Near:   Left Eye Near:    Bilateral Near:     Physical Exam Vitals signs and nursing note reviewed.  Constitutional:      General: She is not in acute distress.    Appearance: She is well-developed.  HENT:     Head: Normocephalic.     Nose: Nose normal.  Eyes:     Pupils: Pupils are equal, round, and reactive to light.  Neck:     Musculoskeletal: Normal range of motion.  Cardiovascular:     Rate and Rhythm: Normal rate.     Heart sounds: Normal heart sounds.  Pulmonary:     Effort: Pulmonary effort is normal.     Breath sounds: Normal breath sounds.  Abdominal:     Tenderness: There is no abdominal tenderness.  Musculoskeletal:     Right shoulder: She exhibits decreased range of motion and tenderness. She exhibits no  bony tenderness, no swelling  and no deformity.     Right lower leg: No edema.     Left lower leg: No edema.     Comments: Right shoulder has decreased external rotation and strength.  The patient has difficulty actively and passively abducting above the horizontal.  There is mild  tenderness over the insertion of biceps tendon.  Apley's and empty can test positive.  Lymphadenopathy:     Cervical: No cervical adenopathy.  Skin:    General: Skin is warm and dry.     Findings: No rash.  Neurological:     Mental Status: She is alert.      UC Treatments / Results  Labs (all labs ordered are listed, but only abnormal results are displayed) Labs Reviewed - No data to display  EKG  Rate:  65 BPM PR:  17msec QT:  412 msec QTcH:  428 msec QRSD:  88 msec QRS axis:  3 degrees Interpretation:  within normal limits; normal sinus rhythm; no acute changes  Radiology Dg Shoulder Right  Result Date: 07/21/2018 CLINICAL DATA:  43 year-old female c/o RIGHT shoulder pain x 2 months. 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 Possible RTC tear suspected right shoulder pain for 3 weeks EXAM: RIGHT SHOULDER - 2+ VIEW COMPARISON:  None. FINDINGS: Glenohumeral joint is intact. No evidence of scapular fracture or humeral fracture. The acromioclavicular joint is intact. IMPRESSION: No fracture or dislocation.   No arthropathy identified. Electronically Signed   By: Suzy Bouchard M.D.   On: 07/21/2018 16:39    Procedures Procedures (including critical care time)  Medications Ordered in UC Medications - No data to display  Initial Impression / Assessment and Plan / UC Course  I have reviewed the triage vital signs and the nursing notes.  Pertinent labs & imaging results that were available during my care of the patient were reviewed by me and considered in my medical decision making (see chart for details).    Followup with Dr. Aundria Mems or Dr. Lynne Leader ([facility]) if  not improving about two weeks.    Final Clinical Impressions(s) / UC Diagnoses   Final diagnoses:  Palpitations  Right rotator cuff tendonitis     Discharge Instructions     Apply ice pack for 20 to 30 minutes, 3 to 4 times daily  Continue until pain decreases.  Begin range of motion and stretching exercises as tolerated.    ED Prescriptions    None         Kandra Nicolas, MD 07/26/18 1759

## 2018-07-21 NOTE — Discharge Instructions (Addendum)
Apply ice pack for 20 to 30 minutes, 3 to 4 times daily  Continue until pain decreases.  Begin range of motion and stretching exercises as tolerated.

## 2018-07-21 NOTE — ED Triage Notes (Signed)
RT shoulder pain radiates down to RT arm, Chest pressure, Palpitation, dizziness, nausea all intermittently for 3 weeks

## 2018-08-04 ENCOUNTER — Ambulatory Visit (INDEPENDENT_AMBULATORY_CARE_PROVIDER_SITE_OTHER): Payer: 59

## 2018-08-04 ENCOUNTER — Encounter: Payer: Self-pay | Admitting: Family Medicine

## 2018-08-04 ENCOUNTER — Other Ambulatory Visit: Payer: Self-pay

## 2018-08-04 ENCOUNTER — Ambulatory Visit: Payer: 59 | Admitting: Family Medicine

## 2018-08-04 VITALS — BP 111/72 | HR 76 | Temp 97.2°F | Wt 128.0 lb

## 2018-08-04 DIAGNOSIS — M542 Cervicalgia: Secondary | ICD-10-CM

## 2018-08-04 DIAGNOSIS — M25511 Pain in right shoulder: Secondary | ICD-10-CM

## 2018-08-04 DIAGNOSIS — M25531 Pain in right wrist: Secondary | ICD-10-CM

## 2018-08-04 DIAGNOSIS — M47812 Spondylosis without myelopathy or radiculopathy, cervical region: Secondary | ICD-10-CM | POA: Diagnosis not present

## 2018-08-04 MED ORDER — DICLOFENAC SODIUM 1 % TD GEL: 2.0000 g | Freq: Four times a day (QID) | TRANSDERMAL | 11 refills | Status: DC

## 2018-08-04 MED FILL — DICLOFENAC SODIUM 1 % GEL: 1 | 13 days supply | Qty: 100 | Fill #0

## 2018-08-04 NOTE — Patient Instructions (Addendum)
Thank you for coming in today.  Attend PT.  Work on home exercises.  Use diclofenac gel on the wrist and shoulder.   Work on wrist extension exercises.   Work on shoulder ROM exercise.     Adhesive Capsulitis  Adhesive capsulitis, also called frozen shoulder, causes the shoulder to become stiff and painful to move. This condition happens when there is inflammation of the tendons and ligaments that surround the shoulder joint (shoulder capsule). What are the causes? This condition may be caused by:  An injury to your shoulder joint.  Straining your shoulder.  Not moving your shoulder for a period of time. This can happen if your arm was injured or in a sling.  Long-standing conditions, such as: ? Diabetes. ? Thyroid problems. ? Heart disease. ? Stroke. ? Rheumatoid arthritis. ? Lung disease. In some cases, the cause is not known. What increases the risk? You are more likely to develop this condition if you are:  A woman.  Older than 42 years of age. What are the signs or symptoms? Symptoms of this condition include:  Pain in your shoulder when you move your arm. There may also be pain when parts of your shoulder are touched. The pain may be worse at night or when you are resting.  A sore or aching shoulder.  The inability to move your shoulder normally.  Muscle spasms. How is this diagnosed? This condition is diagnosed with a physical exam and imaging tests, such as an X-ray or MRI. How is this treated? This condition may be treated with:  Treatment of the underlying cause or condition.  Medicine. Medicine may be given to relieve pain, inflammation, or muscle spasms.  Steroid injections into the shoulder joint.  Physical therapy. This involves performing exercises to get the shoulder moving again.  Acupuncture. This is a type of treatment that involves stimulating specific points on your body by inserting thin needles through your skin.  Shoulder  manipulation. This is a procedure to move the shoulder into another position. It is done after you are given a medicine to make you fall asleep (general anesthetic). The joint may also be injected with salt water at high pressure to break down scarring.  Surgery. This may be done in severe cases when other treatments have failed. Although most people recover completely from adhesive capsulitis, some may not regain full shoulder movement. Follow these instructions at home: Managing pain, stiffness, and swelling      If directed, put ice on the injured area: ? Put ice in a plastic bag. ? Place a towel between your skin and the bag. ? Leave the ice on for 20 minutes, 2-3 times per day.  If directed, apply heat to the affected area before you exercise. Use the heat source that your health care provider recommends, such as a moist heat pack or a heating pad. ? Place a towel between your skin and the heat source. ? Leave the heat on for 20-30 minutes. ? Remove the heat if your skin turns bright red. This is especially important if you are unable to feel pain, heat, or cold. You may have a greater risk of getting burned. General instructions  Take over-the-counter and prescription medicines only as told by your health care provider.  If you are being treated with physical therapy, follow instructions from your physical therapist.  Avoid exercises that put a lot of demand on your shoulder, such as throwing. These exercises can make pain worse.  Keep all  follow-up visits as told by your health care provider. This is important. Contact a health care provider if:  You develop new symptoms.  Your symptoms get worse. Summary  Adhesive capsulitis, also called frozen shoulder, causes the shoulder to become stiff and painful to move.  You are more likely to have this condition if you are a woman and over age 44.  It is treated with physical therapy, medicines, and sometimes surgery. This  information is not intended to replace advice given to you by your health care provider. Make sure you discuss any questions you have with your health care provider. Document Released: 01/04/2009 Document Revised: 08/13/2017 Document Reviewed: 08/13/2017 Elsevier Interactive Patient Education  2019 [employer].

## 2018-08-04 NOTE — Progress Notes (Signed)
Subjective:    CC: Shoulder pain   HPI: Patient notes right shoulder pain ongoing for about 3 months she was seen in urgent care on April 30.  At that time she had a 3-week history of worsening left shoulder pain.  Ongoing however since February.  Pain is worse with overhead motion reaching back.  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  She had difficulty at work.  In urgent care she was given a series of home exercises to do.  In the interim she notes that she continues to experience bothersome shoulder pain.  She notes pain is mostly in the lateral upper arm.  Pain is worse with overhead motion reaching back and at bedtime.  She notes also lack of range of motion.  Her symptoms are very bothersome and are now interfering at home and at work.    Additionally she has some pain into the periscapular region on the right thoracic back.  She notes this is bothersome as well with shoulder motion.  Also she notes a pain into the dorsal right wrist.  She notes some numbness and tingling also into the dorsal second and third digits associated with the wrist pain.  Wrist pain is worse with activity.  She notes that she has to apply pressure with an extended wrist as part of her job duties when they are removing a central line.  He notes this is particularly painful.  Past medical history, Surgical history, Family history not pertinant except as noted below, Social history, Allergies, and medications have been entered into the medical record, reviewed, and no changes needed.   Review of Systems: No headache, visual changes, nausea, vomiting, diarrhea, constipation, dizziness, abdominal pain, skin rash, fevers, chills, night sweats, weight loss, swollen lymph nodes, body aches, joint swelling, muscle aches, chest pain, shortness of breath, mood changes, visual or auditory hallucinations.   Objective:    Vitals:   08/04/18 1053  BP: 111/72  Pulse: 76  Temp: (!) 97.2 F (36.2 C)   General: Well Developed, well nourished, and in no acute distress.  Neuro/Psych: Alert and oriented x3, extra-ocular muscles intact, able to move all 4 extremities, sensation grossly intact. Skin: Warm and dry, no rashes noted.  Respiratory: Not using accessory muscles, speaking in full sentences, trachea midline.  Cardiovascular: Pulses palpable, no extremity edema. Abdomen: Does not appear distended. MSK:  C-spine: Nontender to cervical midline.  Normal cervical motion. Tender palpation right trapezius and right periscapular area. Negative Spurling's test. Upper extremity strength reflexes and sensation are equal and normal throughout.  Right shoulder: Normal-appearing not particularly tender. Range of motion abduction 90 degrees active and 110 degrees passive. External rotation 20 degrees beyond the neutral position. Internal rotation to iliac crest. Strength intact abduction internal and external rotation.  Pain with abduction and external rotation. Positive Hawkins and Neer's test.  Positive empty can test Negative Yergason's and speeds test.  Left shoulder normal-appearing nontender normal motion normal strength negative impingement testing  Right wrist normal-appearing.  Tender palpation mid dorsal wrist just distal to the radius overlying the scaphoid lunate area. Normal wrist motion.  Lab and Radiology Results Dg Shoulder Right  Result Date: 07/21/2018 CLINICAL DATA:  43 year-old female c/o RIGHT shoulder pain x 2 months. 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 Possible RTC tear suspected right shoulder pain for 3 weeks  EXAM: RIGHT SHOULDER - 2+ VIEW COMPARISON:  None. FINDINGS: Glenohumeral joint is intact. No evidence of scapular fracture or humeral fracture. The acromioclavicular joint is intact. IMPRESSION: No fracture or dislocation.   No arthropathy identified.  Electronically Signed   By: Suzy Bouchard M.D.   On: 07/21/2018 16:39   I personally (independently) visualized and performed the interpretation of the images attached in this note. Per my read patient does have some increase density located in the insertion of the supraspinatus tendon of the humeral head consistent with chronic rotator cuff tendinopathy.  X-ray C-spine: No significant or severe degenerative changes.  No fractures.  Relatively normal C-spine.  X-ray right wrist: Normal-appearing no fractures or severe deformities or degenerative changes.  X-ray images personally independently reviewed.  Await formal radiology over read  Limited musculoskeletal ultrasound right shoulder: Biceps tendon. Subscapularis tendon is normal. Supraspinatus tendon with calcifications at the distal insertion onto the humeral head.  Thickened subacromial bursa present.  No tear present. Infraspinatus tendon normal-appearing with no tears. Glenohumeral joint with hyperechoic change deep in the glenohumeral joint not fully visible. AC joint with mild effusion. Normal bony structures. Impression: Supraspinatus tendinitis with possible posterior glenohumeral pathology not fully visualized.  Limited musculoskeletal ultrasound right wrist: Tensor tendons are normal-appearing.  Small change at the distal first carpal row joint with V-shaped structure.  Consistent with old avulsion fracture or possible ligament stump.  Procedure: Real-time Ultrasound Guided Injection of right subacromial bursa Device: GE Logiq E   Images permanently stored and available for review in the ultrasound unit. Verbal informed consent obtained.  Discussed risks and benefits of procedure. Warned about infection bleeding damage to structures skin hypopigmentation and fat atrophy among others. Patient expresses understanding and agreement Time-out conducted.   Noted no overlying erythema, induration, or other signs of local  infection.   Skin prepped in a sterile fashion.   Local anesthesia: Topical Ethyl chloride.   With sterile technique and under real time ultrasound guidance:  40 mg Kenalog and 2 mL Marcaine injected easily.   Completed without difficulty   Pain partially resolved suggesting accurate placement of the medication.   Advised to call if fevers/chills, erythema, induration, drainage, or persistent bleeding.   Images permanently stored and available for review in the ultrasound unit.  Impression: Technically successful ultrasound guided injection.    Impression and Recommendations:    Assessment and Plan: 43 y.o. female with right shoulder pain.  Patient has ongoing pain now for 3 months.  Pain is worsening despite physician directed home exercise program for at least [redacted] weeks along with trials of oral NSAIDs.  Patient's physical exam is consistent with with rotator cuff tendinopathy as well as early adhesive capsulitis.  Given her only partial response to subacromial injection today I think adhesive capsulitis is quite likely.  Plan for trial of formal physical therapy along with diclofenac gel.  Recheck in about a month if not improving.  At that point would be reasonable to proceed with glenohumeral injection or MRI.  Additionally patient has right wrist pain.  She has structure visible on ultrasound at the distal radius that could be an old avulsion fracture.  She is having some pain with wrist extension which I think is probably related to that issue.  Discussed modification of her hand positioning at work as well as trial of diclofenac gel and home exercise program.  If not better will recheck in about a month as well.  Periscapular shoulder pain: Likely related to scapular  dysfunction due to her shoulder lack of range of motion.  Physical therapy should help with this.   PDMP not reviewed this encounter. Orders Placed This Encounter  Procedures   DG Wrist Complete Right    Standing  Status:   Future    Number of Occurrences:   1    Standing Expiration Date:   10/04/2019    Order Specific Question:   Reason for Exam (SYMPTOM  OR DIAGNOSIS REQUIRED)    Answer:   eval right dorsal wrist pain    Order Specific Question:   Is patient pregnant?    Answer:   No    Order Specific Question:   Preferred imaging location?    Answer:   [facility]    Order Specific Question:   Radiology Contrast Protocol - do NOT remove file path    Answer:   \[email or web address]   DG Cervical Spine Complete    Standing Status:   Future    Number of Occurrences:   1    Standing Expiration Date:   10/04/2019    Order Specific Question:   Reason for Exam (SYMPTOM  OR DIAGNOSIS REQUIRED)    Answer:   eval neck pain and right possible c7 radicular pain    Order Specific Question:   Is patient pregnant?    Answer:   No    Order Specific Question:   Preferred imaging location?    Answer:   [facility]    Order Specific Question:   Radiology Contrast Protocol - do NOT remove file path    Answer:   \[email or web address]\[email or web address]\[email or web address].[email or web address]   Ambulatory referral to Physical Therapy    Referral Priority:   Routine    Referral Type:   Physical Medicine    Referral Reason:   Specialty Services Required    Requested Specialty:   Physical Therapy   Meds ordered this encounter  Medications   diclofenac sodium (VOLTAREN) 1 % GEL    Sig: Apply 2 g topically 4 (four) times daily. To affected joint.    Dispense:  100 g    Refill:  11    Discussed warning signs or symptoms. Please see discharge instructions. Patient expresses understanding.

## 2018-08-08 ENCOUNTER — Encounter: Payer: Self-pay | Admitting: Family Medicine

## 2018-08-08 ENCOUNTER — Other Ambulatory Visit: Payer: Self-pay

## 2018-08-08 ENCOUNTER — Ambulatory Visit: Payer: 59 | Admitting: Rehabilitative and Restorative Service Providers"

## 2018-08-08 ENCOUNTER — Encounter: Payer: Self-pay | Admitting: Rehabilitative and Restorative Service Providers"

## 2018-08-08 DIAGNOSIS — R293 Abnormal posture: Secondary | ICD-10-CM

## 2018-08-08 DIAGNOSIS — G8929 Other chronic pain: Secondary | ICD-10-CM

## 2018-08-08 DIAGNOSIS — R29898 Other symptoms and signs involving the musculoskeletal system: Secondary | ICD-10-CM

## 2018-08-08 DIAGNOSIS — M25511 Pain in right shoulder: Secondary | ICD-10-CM

## 2018-08-08 DIAGNOSIS — M25611 Stiffness of right shoulder, not elsewhere classified: Secondary | ICD-10-CM | POA: Diagnosis not present

## 2018-08-08 NOTE — Patient Instructions (Signed)
Access Code: [email or web address]  URL: [email or web address]  Date: 08/08/2018  Prepared by: Gillermo Murdoch   Exercises  Seated Cervical Retraction - 10 reps - 1 sets - 3x daily - 7x weekly  Standing Scapular Retraction - 10 reps - 1 sets - 10 hold - 3x daily - 7x weekly  Shoulder External Rotation and Scapular Retraction - 10 reps - 1 sets - hold - 3x daily - 7x weekly  Seated Shoulder Flexion AAROM with Pulley Behind - 10 reps - 1 sets - 10sec hold - 2x daily - 7x weekly  Standing Shoulder and Trunk Flexion at Table - 3 reps - 1 sets - 10sec hold - 2x daily - 7x weekly  Standing Shoulder Extension ROM with Dowel - 3-5 reps - 1 sets - 10sec hold - 2x daily - 7x weekly  Circular Shoulder Pendulum with Table Support - 30 reps - 1 sets - 2x daily - 7x weekly  Patient Education  TENS Unit

## 2018-08-08 NOTE — Therapy (Signed)
[facility] [place], [place], [address] Phone: [phone]   Fax:  [phone]  Physical Therapy Evaluation  Patient Details  Name: [name] MRN: [id number] Date of Birth: 12/23/1975 Referring Provider (PT): Dr Georgina Snell    Encounter Date: 08/08/2018    Past Medical History:  Diagnosis Date  . History of chicken pox   . History of gestational diabetes   . History of measles, mumps, or rubella   . No pertinent past medical history     Past Surgical History:  Procedure Laterality Date  . BREAST CYST EXCISION  2006  . CESAREAN SECTION    . TUBAL LIGATION      There were no vitals filed for this visit.   Subjective Assessment - 08/08/18 0714    Subjective  Injury to Rt shoulder when pressing down to treat patient 05/07/2018 which she treated with ce and medication. Symptoms have gradually increased. She has difficulty using Rt UE for functional activities and some trouble sleeping. She received an injection last week which helped some.     Pertinent History  unremarkable - arthritic changes C5/6     Diagnostic tests  x-rays - cervical dysfunction     Patient Stated Goals  get rid of pain and use arm normally     Currently in Pain?  Yes    Pain Score  0-No pain   up to 8/10 with movement    Pain Location  Shoulder    Pain Orientation  Right    Pain Descriptors / Indicators  Aching;Sore    Pain Type  Acute pain    Pain Radiating Towards  neck to ear; scapular area to hand     Pain Onset  More than a month ago    Pain Frequency  Intermittent    Aggravating Factors   movement     Pain Relieving Factors  rest, injection         [facility] PT Assessment - 08/08/18 0001      Assessment   Medical Diagnosis  Rt shoulder dysfunction     Referring Provider (PT)  Dr Georgina Snell     Onset Date/Surgical Date  05/07/18    Hand Dominance  Right    Next MD Visit  as needed     Prior Therapy  none        Precautions   Precautions  None      Balance Screen   Has the patient fallen in the past 6 months  No    Has the patient had a decrease in activity level because of a fear of falling?   No    Is the patient reluctant to leave their home because of a fear of falling?   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     Leisure  household chores; children       Observation/Other Assessments   Focus on Therapeutic Outcomes (FOTO)   46% limitation       Sensation   Additional Comments  intermittent into Rt hand       Posture/Postural Control   Posture Comments  head forward; shoulders rounded and elevated; head of the humerus anterior in otientation; scapulae abducted and rotated along the thoracic wall       AROM  Right/Left Shoulder  --   pain with Rt shoulder ROM > w/ abd/ER/IR/flex    Right Shoulder Extension  22 Degrees    Right Shoulder Flexion  120 Degrees    Right Shoulder ABduction  82 Degrees    Right Shoulder Internal Rotation  18 Degrees    Right Shoulder External Rotation  38 Degrees    Left Shoulder Extension  45 Degrees    Left Shoulder Flexion  170 Degrees    Left Shoulder ABduction  168 Degrees    Left Shoulder Internal Rotation  35 Degrees    Left Shoulder External Rotation  95 Degrees    Cervical Flexion  69    Cervical Extension  53    Cervical - Right Side Bend  25    Cervical - Left Side Bend  28    Cervical - Right Rotation  42    Cervical - Left Rotation  37      Strength   Overall Strength Comments  WFL's moving in available planes of movement bilat shoulders; 4/5 and painful with resistive testing Rt ER; horizontal abd       Palpation   Spinal mobility  hypomobile cervical and upper thoracic spine     Palpation comment  muscular tightness Rt > Lt ant/lat/post cervical musculature; upper trap; leveator; pecs; teres                  Objective measurements completed on examination: See above findings.      [facility] Adult PT Treatment/Exercise - 08/08/18 0001      Neuro Re-ed    Neuro Re-ed Details   working on posture and alignment encouraging work through the posterior shoulder girdle musculature to improve upright posture       Shoulder Exercises: Standing   Other Standing Exercises  axial extension 10 sec x 5; scap squeeze 10 sec x 10; L's x 10 with swim noodle (paiin with trial of W's - stopped)       Shoulder Exercises: Pulleys   Flexion  --   10 sec hold x 5 reps      Shoulder Exercises: Stretch   Table Stretch - Flexion  2 reps;10 seconds   hands on counter stepping back to stretch into flexion    Other Shoulder Stretches  shoulder extension with some IR using cane behind back 10 sec hold x 3 reps       Moist Heat Therapy   Number Minutes Moist Heat  15 Minutes    Moist Heat Location  Shoulder   Rt shoulder girdle      Electrical Stimulation   Electrical Stimulation Location  15    Electrical Stimulation Action  IFC    Electrical Stimulation Parameters  to tolerance    Electrical Stimulation Goals  Pain;Tone             PT Education - 08/08/18 0759    Education Details  HEP ball release; sitting; TENS     Person(s) Educated  Patient    Methods  Explanation;Demonstration;Tactile cues;Verbal cues;Handout    Comprehension  Verbalized understanding;Returned demonstration;Verbal cues required;Tactile cues required          PT Long Term Goals - 08/08/18 1022      PT LONG TERM GOAL #1   Title  Improve posture and alignment with patient to demonstrate improve upright posture with posterior shoudler girdle engaged 6/29/202     Time  6    Period  Weeks  Status  New      PT LONG TERM GOAL #2   Title  Increase AROM Rt shoulder to equal Lt shoulder 09/19/2018    Time  6    Period  Weeks    Status  New      PT LONG TERM GOAL #3   Title  Patient reports return to  normal functional activities with Rt UE with minimal to no limitations and pain no more than 1-2/10 on 0-10 scale 09/19/2018    Time  6    Period  Weeks    Status  New      PT LONG TERM GOAL #4   Title  Independent in HEP 09/19/2018    Time  6    Period  Weeks    Status  New      PT LONG TERM GOAL #5   Title  Improve FOTO to </= 32% limitaion 09/19/2018    Time  6    Period  Weeks    Status  New             Plan - 08/08/18 0759    Clinical Impression Statement  Patient presents with Rt shoulder pain present for the past 6 months with symptoms gradually increasing in the past several weeks. Patient has poor posture and alignment; limited Rt UE and cervical ROM/mobility; pain wiht resistive testing Rt shoulder strength; pain with functional activities; muscular tightness with palpation Rt upper quarter. Patient will benefit from PT to address problems identified.     Stability/Clinical Decision Making  Stable/Uncomplicated    Clinical Decision Making  Low    Rehab Potential  Good    PT Frequency  2x / week    PT Duration  6 weeks    PT Treatment/Interventions  Patient/family education;ADLs/Self Care Home Management;Cryotherapy;Electrical Stimulation;Iontophoresis 4mg /ml Dexamethasone;Traction;Moist Heat;Ultrasound;Vasopneumatic Device;Passive range of motion;Dry needling;Manual techniques;Therapeutic activities;Therapeutic exercise;Neuromuscular re-education    PT Next Visit Plan  Review HEP; progress with postural correction/inhibition of upper trap; stretch for pec as tolerated; add manual work for Rt upper quarter with PROm as tolerated(early signs of adhesive capsulitis); posterior shoulder girdle strengthening; modalities as indicated     PT Home Exercise Plan  Access Code: [email or web address]     Consulted and Agree with Plan of Care  Patient       Patient will benefit from skilled therapeutic intervention in order to improve the following deficits and impairments:  Pain, Improper body  mechanics, Postural dysfunction, Increased fascial restricitons, Increased muscle spasms, Hypomobility, Decreased strength, Decreased range of motion, Decreased mobility, Decreased activity tolerance  Visit Diagnosis: Chronic right shoulder pain - Plan: PT plan of care cert/re-cert  Abnormal posture - Plan: PT plan of care cert/re-cert  Other symptoms and signs involving the musculoskeletal system - Plan: PT plan of care cert/re-cert  Stiffness of right shoulder, not elsewhere classified - Plan: PT plan of care cert/re-cert     Problem List Patient Active Problem List   Diagnosis Date Noted  . Palpitations 08/03/2016  . Common migraine with intractable migraine 06/19/2016  . Vasovagal near syncope 06/18/2016  . Pre-syncope 06/18/2016  . Dizziness 04/08/2012  . Anemia associated with acute blood loss--due to adhesions and C/S 11/14/2011  . Status post repeat low transverse cesarean section and BTL 11/11/2011  . Pregnancy with history of caesarean section, antepartum 07/09/2011  . Menstrual cycle disorder 07/09/2011  . Gestational diabetes mellitus in pregnancy 07/09/2011  . AMA (advanced maternal age) multigravida 35+ 07/09/2011    Chalise Pe  Nilda Simmer PT, MPH  08/08/2018, 10:28 AM  [facility] [place], [place], [address] Phone: [phone]   Fax:  [phone]  Name: [name] MRN: [id number] Date of Birth: 02-Oct-1975

## 2018-08-11 ENCOUNTER — Ambulatory Visit: Payer: 59 | Admitting: Rehabilitative and Restorative Service Providers"

## 2018-08-11 ENCOUNTER — Encounter: Payer: Self-pay | Admitting: Rehabilitative and Restorative Service Providers"

## 2018-08-11 ENCOUNTER — Other Ambulatory Visit: Payer: Self-pay

## 2018-08-11 DIAGNOSIS — M25511 Pain in right shoulder: Secondary | ICD-10-CM | POA: Diagnosis not present

## 2018-08-11 DIAGNOSIS — M25611 Stiffness of right shoulder, not elsewhere classified: Secondary | ICD-10-CM

## 2018-08-11 DIAGNOSIS — R29898 Other symptoms and signs involving the musculoskeletal system: Secondary | ICD-10-CM | POA: Diagnosis not present

## 2018-08-11 DIAGNOSIS — R293 Abnormal posture: Secondary | ICD-10-CM | POA: Diagnosis not present

## 2018-08-11 DIAGNOSIS — G8929 Other chronic pain: Secondary | ICD-10-CM

## 2018-08-11 NOTE — Patient Instructions (Signed)
Access Code: [email or web address]  URL: [email or web address]  Date: 08/11/2018  Prepared by: Gillermo Murdoch   Exercises  Seated Cervical Retraction - 10 reps - 1 sets - 3x daily - 7x weekly  Standing Scapular Retraction - 10 reps - 1 sets - 10 hold - 3x daily - 7x weekly  Shoulder External Rotation and Scapular Retraction - 10 reps - 1 sets - hold - 3x daily - 7x weekly  Seated Shoulder Flexion AAROM with Pulley Behind - 10 reps - 1 sets - 10sec hold - 2x daily - 7x weekly  Standing Shoulder and Trunk Flexion at Table - 3 reps - 1 sets - 10sec hold - 2x daily - 7x weekly  Standing Shoulder Extension ROM with Dowel - 3-5 reps - 1 sets - 10sec hold - 2x daily - 7x weekly  Circular Shoulder Pendulum with Table Support - 30 reps - 1 sets - 2x daily - 7x weekly  Standing Shoulder External Rotation AAROM with Dowel - 3 reps - 1 sets - 30 sec hold - 2x daily - 7x weekly  Standing Shoulder External Rotation Stretch in Doorway - 3 reps - 1 sets - 10sec hold - 2x daily - 7x weekly  Doorway Pec Stretch at 60 Elevation - 3 reps - 1 sets - 20-30 sec hold - 2x daily - 7x weekly  Doorway Pec Stretch at 90 Degrees Abduction - 3 reps - 1 sets - 20-30 sec hold - 2x daily - 7x weekly

## 2018-08-11 NOTE — Therapy (Signed)
[place], [place], [address] Phone: [phone]   Fax:  [phone]  Physical Therapy Treatment  Patient Details  Name: [name] MRN: [id number] Date of Birth: 07-Jun-1975 Referring Provider (PT): Dr Georgina Snell    Encounter Date: 08/11/2018  PT End of Session - 08/11/18 0856    Visit Number  2    Number of Visits  12    Date for PT Re-Evaluation  09/19/18    PT Start Time  0901    PT Stop Time  1000    PT Time Calculation (min)  59 min    Activity Tolerance  Patient tolerated treatment well       Past Medical History:  Diagnosis Date  . History of chicken pox   . History of gestational diabetes   . History of measles, mumps, or rubella   . No pertinent past medical history     Past Surgical History:  Procedure Laterality Date  . BREAST CYST EXCISION  2006  . CESAREAN SECTION    . TUBAL LIGATION      There were no vitals filed for this visit.  Subjective Assessment - 08/11/18 0904    Subjective  Shoulder pain continues - much worse with movement - up to 8/10     Currently in Pain?  Yes   no pain at rest    Pain Score  8    with movement    Pain Location  Shoulder    Pain Orientation  Right    Pain Descriptors / Indicators  Aching;Sore    Pain Type  Acute pain                       [facility] Adult PT Treatment/Exercise - 08/11/18 0001      Neuro Re-ed    Neuro Re-ed Details   working to inhibit upper trap activity and engourage activation of posterior shoulder girdle musculature       Shoulder Exercises: Supine   Other Supine Exercises  chest lift 5 sec x 10       Shoulder Exercises: Standing   Other Standing Exercises  axial extension 10 sec x 5; scap squeeze 10 sec x 10; L's x 10 with swim noodle      Shoulder Exercises: Pulleys   Flexion  --   10 sec hold x 10 reps      Shoulder Exercises: Stretch   External Rotation Stretch  3 reps;10 seconds    standing with cane    Other Shoulder Stretches  shoulder extension with some IR using cane behind back 10 sec hold x 3 reps     Other Shoulder Stretches  first position doorway stretch 10 sec x 3; tiral of second position increased pain - encouraged patient to work toward this even by just resting her hands on the doorway at this position     External Rotation Stretch Limitations  ER at doorway turning body to stretch into ER 10 sec x 3 reps       Moist Heat Therapy   Number Minutes Moist Heat  15 Minutes    Moist Heat Location  Shoulder   Rt shoulder girdle      Electrical Stimulation   Electrical Stimulation Location  Rt shoulder girdle     Electrical Stimulation Action  IFC    Electrical Stimulation Parameters  to tolerance     Electrical Stimulation Goals  Pain;Tone  Manual Therapy   Manual therapy comments  pt supine     Joint Mobilization  [place] joint mobs - caudal; posteriorly     Soft tissue mobilization  deep tissue work through the pecs; lateral cervical musculature; upper trap; leveator; anterior deltoid; biceps     Myofascial Release  pecs     Scapular Mobilization  Rt scapula     Passive ROM  Rt shoulder flexion; ER in scapular plane    Manual Traction  through Rt UE in neutral position              PT Education - 08/11/18 0921    Education Details  HEP    Person(s) Educated  Patient    Methods  Explanation;Demonstration;Tactile cues;Verbal cues;Handout    Comprehension  Verbalized understanding;Returned demonstration;Verbal cues required;Tactile cues required          PT Long Term Goals - 08/08/18 1022      PT LONG TERM GOAL #1   Title  Improve posture and alignment with patient to demonstrate improve upright posture with posterior shoudler girdle engaged 6/29/202     Time  6    Period  Weeks    Status  New      PT LONG TERM GOAL #2   Title  Increase AROM Rt shoulder to equal Lt shoulder 09/19/2018    Time  6    Period  Weeks    Status  New       PT LONG TERM GOAL #3   Title  Patient reports return to normal functional activities with Rt UE with minimal to no limitations and pain no more than 1-2/10 on 0-10 scale 09/19/2018    Time  6    Period  Weeks    Status  New      PT LONG TERM GOAL #4   Title  Independent in HEP 09/19/2018    Time  6    Period  Weeks    Status  New      PT LONG TERM GOAL #5   Title  Improve FOTO to </= 32% limitaion 09/19/2018    Time  6    Period  Weeks    Status  New            Plan - 08/11/18 4970    Clinical Impression Statement  Patient reports continued pain - has started some exercises at home and ordered tENS unit. Patient self limits with ROM exercises. Noted activation of upper trap with most exercises. Worked on neuromuscular re-education to engage posterior shoulder girdle and decrease activation of upper trap. Tolerated manual work and PROM.      Stability/Clinical Decision Making  Stable/Uncomplicated    Rehab Potential  Good    PT Frequency  2x / week    PT Duration  6 weeks    PT Treatment/Interventions  Patient/family education;ADLs/Self Care Home Management;Cryotherapy;Electrical Stimulation;Iontophoresis 4mg /ml Dexamethasone;Traction;Moist Heat;Ultrasound;Vasopneumatic Device;Passive range of motion;Dry needling;Manual techniques;Therapeutic activities;Therapeutic exercise;Neuromuscular re-education    PT Next Visit Plan  Review HEP; progress with postural correction/inhibition of upper trap; stretch for pec as tolerated; add manual work for Rt upper quarter with PROm as tolerated(early signs of adhesive capsulitis); posterior shoulder girdle strengthening; modalities as indicated     PT Home Exercise Plan  Access Code: [email or web address]     Consulted and Agree with Plan of Care  Patient       Patient will benefit from skilled therapeutic intervention in order to improve the following deficits and  impairments:  Pain, Improper body mechanics, Postural dysfunction, Increased fascial  restricitons, Increased muscle spasms, Hypomobility, Decreased strength, Decreased range of motion, Decreased mobility, Decreased activity tolerance  Visit Diagnosis: Chronic right shoulder pain  Abnormal posture  Other symptoms and signs involving the musculoskeletal system  Stiffness of right shoulder, not elsewhere classified     Problem List Patient Active Problem List   Diagnosis Date Noted  . Palpitations 08/03/2016  . Common migraine with intractable migraine 06/19/2016  . Vasovagal near syncope 06/18/2016  . Pre-syncope 06/18/2016  . Dizziness 04/08/2012  . Anemia associated with acute blood loss--due to adhesions and C/S 11/14/2011  . Status post repeat low transverse cesarean section and BTL 11/11/2011  . Pregnancy with history of caesarean section, antepartum 07/09/2011  . Menstrual cycle disorder 07/09/2011  . Gestational diabetes mellitus in pregnancy 07/09/2011  . AMA (advanced maternal age) multigravida 35+ 07/09/2011    Diamante Truszkowski Nilda Simmer PT, MPH  08/11/2018, 9:50 AM  [facility] [place], [place], [address] Phone: [phone]   Fax:  [phone]  Name: [name] MRN: [id number] Date of Birth: 14-Jun-1975

## 2018-08-16 ENCOUNTER — Ambulatory Visit: Payer: 59 | Admitting: Rehabilitative and Restorative Service Providers"

## 2018-08-16 ENCOUNTER — Encounter: Payer: Self-pay | Admitting: Rehabilitative and Restorative Service Providers"

## 2018-08-16 ENCOUNTER — Other Ambulatory Visit: Payer: Self-pay

## 2018-08-16 DIAGNOSIS — M25611 Stiffness of right shoulder, not elsewhere classified: Secondary | ICD-10-CM | POA: Diagnosis not present

## 2018-08-16 DIAGNOSIS — M25511 Pain in right shoulder: Secondary | ICD-10-CM | POA: Diagnosis not present

## 2018-08-16 DIAGNOSIS — G8929 Other chronic pain: Secondary | ICD-10-CM | POA: Diagnosis not present

## 2018-08-16 DIAGNOSIS — R29898 Other symptoms and signs involving the musculoskeletal system: Secondary | ICD-10-CM

## 2018-08-16 DIAGNOSIS — R293 Abnormal posture: Secondary | ICD-10-CM | POA: Diagnosis not present

## 2018-08-16 NOTE — Patient Instructions (Signed)
Access Code: [email or web address]  URL: [email or web address]  Date: 08/16/2018  Prepared by: Gillermo Murdoch   Exercises  Seated Cervical Retraction - 10 reps - 1 sets - 3x daily - 7x weekly  Standing Scapular Retraction - 10 reps - 1 sets - 10 hold - 3x daily - 7x weekly  Shoulder External Rotation and Scapular Retraction - 10 reps - 1 sets - hold - 3x daily - 7x weekly  Seated Shoulder Flexion AAROM with Pulley Behind - 10 reps - 1 sets - 10sec hold - 2x daily - 7x weekly  Standing Shoulder and Trunk Flexion at Table - 3 reps - 1 sets - 10sec hold - 2x daily - 7x weekly  Standing Shoulder Extension ROM with Dowel - 3-5 reps - 1 sets - 10sec hold - 2x daily - 7x weekly  Circular Shoulder Pendulum with Table Support - 30 reps - 1 sets - 2x daily - 7x weekly  Standing Shoulder External Rotation AAROM with Dowel - 3 reps - 1 sets - 30 sec hold - 2x daily - 7x weekly  Standing Shoulder External Rotation Stretch in Doorway - 3 reps - 1 sets - 10sec hold - 2x daily - 7x weekly  Doorway Pec Stretch at 60 Elevation - 3 reps - 1 sets - 20-30 sec hold - 2x daily - 7x weekly    Doorway Pec Stretch at 90 Degrees Abduction - 3 reps - 1 sets - 20-30 sec hold - 2x daily - 7x weekly    Corner Pec Major Stretch - 3 reps - 1 sets - 30 sec hold - 3-4x daily - 7x weekly  Standing Shoulder Internal Rotation Stretch with Towel - 5 reps - 1 sets - 10sec hold - 2-3x daily - 7x weekly   Kinesiology tape What is kinesiology tape?  There are many brands of kinesiology tape.  KTape, [employer], [employer], Dynamic tape, to name a few. It is an elasticized tape designed to support the body's natural healing process. This tape provides stability and support to muscles and joints without restricting motion. It can also help decrease swelling in the area of application. How does it work? The tape microscopically lifts and decompresses the skin to allow for drainage of lymph (swelling) to flow away from area, reducing  inflammation.  The tape has the ability to help re-educate the neuromuscular system by targeting specific receptors in the skin.  The presence of the tape increases the body's awareness of posture and body mechanics.  Do not use with: . Open wounds . Skin lesions . Adhesive allergies Safe removal of the tape: In some rare cases, mild/moderate skin irritation can occur.  This can include redness, itchiness, or hives. If this occurs, immediately remove tape and consult your primary care physician if symptoms are severe or do not resolve within 2 days.  To remove tape safely, hold nearby skin with one hand and gentle roll tape down with other hand.  You can apply oil or conditioner to tape while in shower prior to removal to loosen adhesive.  DO NOT swiftly rip tape off like a band-aid, as this could cause skin tears and additional skin irritation.

## 2018-08-16 NOTE — Therapy (Signed)
[place], [place], [address] Phone: [phone]   Fax:  [phone]  Physical Therapy Treatment  Patient Details  Name: [name] MRN: [id number] Date of Birth: December 04, 1975 Referring Provider (PT): Dr Georgina Snell    Encounter Date: 08/16/2018  PT End of Session - 08/16/18 0902    Visit Number  3    Number of Visits  12    Date for PT Re-Evaluation  09/19/18    PT Start Time  0900    PT Stop Time  0957    PT Time Calculation (min)  57 min    Activity Tolerance  Patient tolerated treatment well       Past Medical History:  Diagnosis Date  . History of chicken pox   . History of gestational diabetes   . History of measles, mumps, or rubella   . No pertinent past medical history     Past Surgical History:  Procedure Laterality Date  . BREAST CYST EXCISION  2006  . CESAREAN SECTION    . TUBAL LIGATION      There were no vitals filed for this visit.  Subjective Assessment - 08/16/18 0903    Subjective  Continued pain but ROM is improving. She is sleeping better. Has a pulley for home. Working on her exercises at home     Currently in Pain?  Yes    Pain Score  8    no pain at rest    Pain Location  Shoulder    Pain Orientation  Right    Pain Descriptors / Indicators  Aching;Sore    Pain Type  Acute pain    Pain Onset  More than a month ago    Pain Frequency  Intermittent    Aggravating Factors   movement     Pain Relieving Factors  rest; injection          [facility] PT Assessment - 08/16/18 0001      Assessment   Medical Diagnosis  Rt shoulder dysfunction     Referring Provider (PT)  Dr Georgina Snell     Onset Date/Surgical Date  05/07/18    Hand Dominance  Right    Next MD Visit  as needed     Prior Therapy  none       AROM   Right Shoulder Extension  48 Degrees    Right Shoulder Flexion  128 Degrees    Right Shoulder ABduction  100 Degrees   elevation of shoulder girdle - scapular  dyskinesis    Right Shoulder Internal Rotation  18 Degrees    Right Shoulder External Rotation  42 Degrees      Palpation   Spinal mobility  hypomobile cervical and upper thoracic spine     Palpation comment  muscular tightness Rt > Lt ant/lat/post cervical musculature; upper trap; leveator; pecs; teres                    [facility] Adult PT Treatment/Exercise - 08/16/18 0001      Shoulder Exercises: Supine   External Rotation Limitations  hands behind head 30-40 sec x 2     Other Supine Exercises  chest lift 5 sec x 10     Other Supine Exercises  prolonged snow angel stretch ~ 2 min       Shoulder Exercises: Standing   Flexion  AROM;Right;10 reps   sliding hand up inclined table w/ scap control    Other  Standing Exercises  axial extension 10 sec x 5; scap squeeze 10 sec x 10; L's x 10 with swim noodle      Shoulder Exercises: Pulleys   Flexion  --   10 sec hold x 10 reps    Scaption  --   10 sec hold x 5 reps      Shoulder Exercises: Stretch   Corner Stretch  3 reps;30 seconds   mid level   Internal Rotation Stretch  3 reps   10 sec hold strap beh9nd back      Moist Heat Therapy   Number Minutes Moist Heat  15 Minutes    Moist Heat Location  Shoulder   Rt shoulder girdle      Electrical Stimulation   Electrical Stimulation Location  Rt shoulder girdle     Electrical Stimulation Action  IFC    Electrical Stimulation Parameters  to tolerance    Electrical Stimulation Goals  Pain;Tone      Manual Therapy   Manual therapy comments  pt supine     Joint Mobilization  [place] joint mobs - caudal; posteriorly     Soft tissue mobilization  deep tissue work through the pecs; lateral cervical musculature; upper trap; leveator; anterior deltoid; biceps     Myofascial Release  pecs     Scapular Mobilization  Rt scapula     Passive ROM  Rt shoulder flexion; ER in scapular plane; extension; cervical PROM     Manual Traction  through Rt UE in neutral position     Kinesiotex  --    tape to inhibit upper trap and improve humeral position             PT Education - 08/16/18 0925    Education Details  HEP taping     Person(s) Educated  Patient    Methods  Explanation;Demonstration;Tactile cues;Verbal cues;Handout    Comprehension  Verbalized understanding;Returned demonstration;Verbal cues required;Tactile cues required          PT Long Term Goals - 08/08/18 1022      PT LONG TERM GOAL #1   Title  Improve posture and alignment with patient to demonstrate improve upright posture with posterior shoudler girdle engaged 6/29/202     Time  6    Period  Weeks    Status  New      PT LONG TERM GOAL #2   Title  Increase AROM Rt shoulder to equal Lt shoulder 09/19/2018    Time  6    Period  Weeks    Status  New      PT LONG TERM GOAL #3   Title  Patient reports return to normal functional activities with Rt UE with minimal to no limitations and pain no more than 1-2/10 on 0-10 scale 09/19/2018    Time  6    Period  Weeks    Status  New      PT LONG TERM GOAL #4   Title  Independent in HEP 09/19/2018    Time  6    Period  Weeks    Status  New      PT LONG TERM GOAL #5   Title  Improve FOTO to </= 32% limitaion 09/19/2018    Time  6    Period  Weeks    Status  New            Plan - 08/16/18 0086    Clinical Impression Statement  No pain at rest; pain  persists with movement but ROM is increasing. Patient is sleeping without awakening due to pain. Gradually progressing toward stated goals of therapy.     Stability/Clinical Decision Making  Stable/Uncomplicated    Rehab Potential  Good    PT Frequency  2x / week    PT Duration  6 weeks    PT Treatment/Interventions  Patient/family education;ADLs/Self Care Home Management;Cryotherapy;Electrical Stimulation;Iontophoresis 4mg /ml Dexamethasone;Traction;Moist Heat;Ultrasound;Vasopneumatic Device;Passive range of motion;Dry needling;Manual techniques;Therapeutic activities;Therapeutic  exercise;Neuromuscular re-education    PT Next Visit Plan  Review HEP; progress with postural correction/inhibition of upper trap; stretch for pec as tolerated; continue manual work for Rt upper quarter with PROm as tolerated(early signs of adhesive capsulitis); posterior shoulder girdle strengthening; modalities as indicated     PT Home Exercise Plan  Access Code: [email or web address]     Consulted and Agree with Plan of Care  Patient       Patient will benefit from skilled therapeutic intervention in order to improve the following deficits and impairments:  Pain, Improper body mechanics, Postural dysfunction, Increased fascial restricitons, Increased muscle spasms, Hypomobility, Decreased strength, Decreased range of motion, Decreased mobility, Decreased activity tolerance  Visit Diagnosis: Chronic right shoulder pain  Abnormal posture  Other symptoms and signs involving the musculoskeletal system  Stiffness of right shoulder, not elsewhere classified     Problem List Patient Active Problem List   Diagnosis Date Noted  . Palpitations 08/03/2016  . Common migraine with intractable migraine 06/19/2016  . Vasovagal near syncope 06/18/2016  . Pre-syncope 06/18/2016  . Dizziness 04/08/2012  . Anemia associated with acute blood loss--due to adhesions and C/S 11/14/2011  . Status post repeat low transverse cesarean section and BTL 11/11/2011  . Pregnancy with history of caesarean section, antepartum 07/09/2011  . Menstrual cycle disorder 07/09/2011  . Gestational diabetes mellitus in pregnancy 07/09/2011  . AMA (advanced maternal age) multigravida 35+ 07/09/2011    Celyn Nilda Simmer PT, MPH  08/16/2018, 10:19 AM  [facility] [place], [place], [address] Phone: [phone]   Fax:  [phone]  Name: [name] MRN: [id number] Date of Birth: October 06, 1975

## 2018-08-18 ENCOUNTER — Encounter: Payer: Self-pay | Admitting: Physical Therapy

## 2018-08-18 ENCOUNTER — Other Ambulatory Visit: Payer: Self-pay

## 2018-08-18 ENCOUNTER — Ambulatory Visit: Payer: 59 | Admitting: Physical Therapy

## 2018-08-18 DIAGNOSIS — M25611 Stiffness of right shoulder, not elsewhere classified: Secondary | ICD-10-CM

## 2018-08-18 DIAGNOSIS — G8929 Other chronic pain: Secondary | ICD-10-CM | POA: Diagnosis not present

## 2018-08-18 DIAGNOSIS — R29898 Other symptoms and signs involving the musculoskeletal system: Secondary | ICD-10-CM | POA: Diagnosis not present

## 2018-08-18 DIAGNOSIS — M25511 Pain in right shoulder: Secondary | ICD-10-CM | POA: Diagnosis not present

## 2018-08-18 DIAGNOSIS — R293 Abnormal posture: Secondary | ICD-10-CM

## 2018-08-18 NOTE — Therapy (Signed)
[place] [place], [place], [address] Phone: [phone]   Fax:  [phone]  Physical Therapy Treatment  Patient Details  Name: [name] MRN: [id number] Date of Birth: Apr 22, 1975 Referring Provider (PT): Dr Georgina Snell    Encounter Date: 08/18/2018  PT End of Session - 08/18/18 1412    Visit Number  4    Number of Visits  12    Date for PT Re-Evaluation  09/19/18    PT Start Time  1406    PT Stop Time  1504    PT Time Calculation (min)  58 min       Past Medical History:  Diagnosis Date  . History of chicken pox   . History of gestational diabetes   . History of measles, mumps, or rubella   . No pertinent past medical history     Past Surgical History:  Procedure Laterality Date  . BREAST CYST EXCISION  2006  . CESAREAN SECTION    . TUBAL LIGATION      There were no vitals filed for this visit.  Subjective Assessment - 08/18/18 1413    Subjective  Pt is unsure if the tape helped at all.  She can see some gradual progress in Rt shoulder.     Patient Stated Goals  get rid of pain and use arm normally     Currently in Pain?  No/denies    Pain Score  0-No pain   no pain at rest; up to 8/10 with movement   Pain Location  Shoulder    Pain Orientation  Right    Pain Descriptors / Indicators  Aching         [facility] PT Assessment - 08/18/18 0001      Assessment   Medical Diagnosis  Rt shoulder dysfunction     Referring Provider (PT)  Dr Georgina Snell     Onset Date/Surgical Date  05/07/18    Hand Dominance  Right    Next MD Visit  as needed     Prior Therapy  none        [facility] Adult PT Treatment/Exercise - 08/18/18 0001      Shoulder Exercises: Supine   External Rotation  AAROM;Right;10 reps   cane   Flexion  AAROM;Both;12 reps   cane   Other Supine Exercises  prolonged snow angel stretch ~ 1 min, 2 reps (1 rep while lying on noodle)       Shoulder Exercises: Standing   Internal  Rotation  AAROM;Both;10 reps   2 sets, cane; side to side and up and down behind back.    Extension  AAROM;Both;12 reps   cane     Shoulder Exercises: Pulleys   Flexion  --   10 sec hold x 10 reps    Scaption  --   10 sec hold x 10 reps      Moist Heat Therapy   Number Minutes Moist Heat  12 Minutes    Moist Heat Location  Shoulder   Rt shoulder girdle      Electrical Stimulation   Electrical Stimulation Location  Rt shoulder girdle     Electrical Stimulation Action  IFC    Electrical Stimulation Parameters  intensity to pt tolerance    Electrical Stimulation Goals  Pain;Tone      Manual Therapy   Manual therapy comments  pt supine     Joint Mobilization  GH joint mobs - caudal; posteriorly  Soft tissue mobilization  STM through Rt pec, bicep, upper trap, subscap;  pt seated for IASTM to Rt pec, bicep, infraspin, teres maj, lateral deltoid, levator - to decrease fascial restrictions and pain.     Myofascial Release  Rt pec     Passive ROM  Rt shoulder flexion; ER in scapular plane; extension    Manual Traction  through Rt UE in neutral position      AROM with Rt arm in all planes after IASTM to assess response to treatment.      PT Long Term Goals - 08/08/18 1022      PT LONG TERM GOAL #1   Title  Improve posture and alignment with patient to demonstrate improve upright posture with posterior shoudler girdle engaged 6/29/202     Time  6    Period  Weeks    Status  New      PT LONG TERM GOAL #2   Title  Increase AROM Rt shoulder to equal Lt shoulder 09/19/2018    Time  6    Period  Weeks    Status  New      PT LONG TERM GOAL #3   Title  Patient reports return to normal functional activities with Rt UE with minimal to no limitations and pain no more than 1-2/10 on 0-10 scale 09/19/2018    Time  6    Period  Weeks    Status  New      PT LONG TERM GOAL #4   Title  Independent in HEP 09/19/2018    Time  6    Period  Weeks    Status  New      PT LONG TERM GOAL  #5   Title  Improve FOTO to </= 32% limitaion 09/19/2018    Time  6    Period  Weeks    Status  New            Plan - 08/18/18 1501    Clinical Impression Statement  Tape was removed at beginning of session, without any change in skin integrity or irritation.  Pt reported mild-moderate increase in pain with ROM exercises; pain decreased with rest breaks.   Rt shoulder flexion AAROM up to 130 deg in supine and 55 deg ER AAROM in supine.  Progressing well towards goals.     Rehab Potential  Good    PT Frequency  2x / week    PT Duration  6 weeks    PT Treatment/Interventions  Patient/family education;ADLs/Self Care Home Management;Cryotherapy;Electrical Stimulation;Iontophoresis 4mg /ml Dexamethasone;Traction;Moist Heat;Ultrasound;Vasopneumatic Device;Passive range of motion;Dry needling;Manual techniques;Therapeutic activities;Therapeutic exercise;Neuromuscular re-education    PT Next Visit Plan  continue progressive ROM / postural strengthening for Rt shoulder.     PT Home Exercise Plan  Access Code: [email or web address]     Consulted and Agree with Plan of Care  Patient       Patient will benefit from skilled therapeutic intervention in order to improve the following deficits and impairments:  Pain, Improper body mechanics, Postural dysfunction, Increased fascial restricitons, Increased muscle spasms, Hypomobility, Decreased strength, Decreased range of motion, Decreased mobility, Decreased activity tolerance  Visit Diagnosis: Chronic right shoulder pain  Abnormal posture  Other symptoms and signs involving the musculoskeletal system  Stiffness of right shoulder, not elsewhere classified     Problem List Patient Active Problem List   Diagnosis Date Noted  . Palpitations 08/03/2016  . Common migraine with intractable migraine 06/19/2016  . Vasovagal near syncope 06/18/2016  .  Pre-syncope 06/18/2016  . Dizziness 04/08/2012  . Anemia associated with acute blood loss--due to adhesions  and C/S 11/14/2011  . Status post repeat low transverse cesarean section and BTL 11/11/2011  . Pregnancy with history of caesarean section, antepartum 07/09/2011  . Menstrual cycle disorder 07/09/2011  . Gestational diabetes mellitus in pregnancy 07/09/2011  . AMA (advanced maternal age) multigravida 35+ 07/09/2011   Kerin Perna, PTA 08/18/18 3:46 PM  [place] [facility] [place], [place], [address] Phone: [phone]   Fax:  [phone]  Name: [name] MRN: [id number] Date of Birth: 20-Jan-1976

## 2018-08-22 ENCOUNTER — Ambulatory Visit: Payer: 59 | Admitting: Rehabilitative and Restorative Service Providers"

## 2018-08-22 ENCOUNTER — Other Ambulatory Visit: Payer: Self-pay

## 2018-08-22 ENCOUNTER — Encounter: Payer: Self-pay | Admitting: Rehabilitative and Restorative Service Providers"

## 2018-08-22 DIAGNOSIS — R293 Abnormal posture: Secondary | ICD-10-CM | POA: Diagnosis not present

## 2018-08-22 DIAGNOSIS — R29898 Other symptoms and signs involving the musculoskeletal system: Secondary | ICD-10-CM

## 2018-08-22 DIAGNOSIS — M25511 Pain in right shoulder: Secondary | ICD-10-CM

## 2018-08-22 DIAGNOSIS — M25611 Stiffness of right shoulder, not elsewhere classified: Secondary | ICD-10-CM | POA: Diagnosis not present

## 2018-08-22 DIAGNOSIS — G8929 Other chronic pain: Secondary | ICD-10-CM | POA: Diagnosis not present

## 2018-08-22 NOTE — Therapy (Signed)
[place], [place], [address] Phone: [phone]   Fax:  [phone]  Physical Therapy Treatment  Patient Details  Name: [name] MRN: [id number] Date of Birth: 1975-10-10 Referring Provider (PT): Dr Georgina Snell    Encounter Date: 08/22/2018  PT End of Session - 08/22/18 0707    Visit Number  5    Number of Visits  12    Date for PT Re-Evaluation  09/19/18    PT Start Time  0705    PT Stop Time  0802    PT Time Calculation (min)  57 min    Activity Tolerance  Patient tolerated treatment well       Past Medical History:  Diagnosis Date  . History of chicken pox   . History of gestational diabetes   . History of measles, mumps, or rubella   . No pertinent past medical history     Past Surgical History:  Procedure Laterality Date  . BREAST CYST EXCISION  2006  . CESAREAN SECTION    . TUBAL LIGATION      There were no vitals filed for this visit.  Subjective Assessment - 08/22/18 0707    Subjective  No pain at rest and sleeping OK. Still hurts when she gets to the end of the available range.     Currently in Pain?  No/denies    Pain Score  --   pain with up to 6-7/10                       [facility] Adult PT Treatment/Exercise - 08/22/18 0001      Shoulder Exercises: Supine   External Rotation Limitations  hands behind head 30-40 sec x 2     Other Supine Exercises  trunk rotation in snow angel position 30 sec hold x 3 to Lt x 1 to Rt     Other Supine Exercises  prolonged snow angel stretch ~ 2-3 min - lying on noodle arms ~ 90 deg abd       Shoulder Exercises: Seated   Other Seated Exercises  seated ER with shoulder in ~ 80 deg abd - PT assist for end range w/slow eccentric release x 5 repa       Shoulder Exercises: Pulleys   Flexion  --   10 sec hold x 10 reps    Scaption  --   10 sec hold x 10 reps      Shoulder Exercises: Stretch   Other Shoulder Stretches   corner stretch 1st and 2nd positions 3 reps 30 sec       Moist Heat Therapy   Number Minutes Moist Heat  16 Minutes    Moist Heat Location  Shoulder   Rt shoulder girdle      Electrical Stimulation   Electrical Stimulation Location  Rt shoulder girdle     Electrical Stimulation Action  IFC    Electrical Stimulation Parameters  to tolerance    Electrical Stimulation Goals  Pain;Tone      Manual Therapy   Manual therapy comments  pt supine and sitting      Joint Mobilization  GH joint mobs - caudal; posteriorly supine; caudal/A/P in sitting UE abducted at 80 deg supported on pillow in table; joint glide in sitting     Soft tissue mobilization  STM through Rt pec, bicep, upper trap, subscap;  pt seated for IASTM to Rt pec, bicep, infraspin, teres maj,  lateral deltoid, levator - to decrease fascial restrictions and pain.     Myofascial Release  Rt pec     Scapular Mobilization  Rt scapula     Passive ROM  Rt shoulder flexion; ER in scapular plane; extension; horizontal abduction     Manual Traction  through Rt UE in neutral position              PT Education - 08/22/18 0747    Education Details  HEP DN     Person(s) Educated  Patient    Methods  Explanation;Demonstration;Tactile cues;Verbal cues;Handout    Comprehension  Verbalized understanding;Returned demonstration;Verbal cues required;Tactile cues required          PT Long Term Goals - 08/08/18 1022      PT LONG TERM GOAL #1   Title  Improve posture and alignment with patient to demonstrate improve upright posture with posterior shoudler girdle engaged 6/29/202     Time  6    Period  Weeks    Status  New      PT LONG TERM GOAL #2   Title  Increase AROM Rt shoulder to equal Lt shoulder 09/19/2018    Time  6    Period  Weeks    Status  New      PT LONG TERM GOAL #3   Title  Patient reports return to normal functional activities with Rt UE with minimal to no limitations and pain no more than 1-2/10 on 0-10 scale  09/19/2018    Time  6    Period  Weeks    Status  New      PT LONG TERM GOAL #4   Title  Independent in HEP 09/19/2018    Time  6    Period  Weeks    Status  New      PT LONG TERM GOAL #5   Title  Improve FOTO to </= 32% limitaion 09/19/2018    Time  6    Period  Weeks    Status  New            Plan - 08/22/18 0707    Clinical Impression Statement  No pain at rest and patient is sleeping well. She continues to have pain with active movement at end ranges but range continues to improve. Patient added exercise without difficulty and is tolerating more PROM. Patient was encouraged to increase intensity of stretch at home. Progress is gradual. Progressing well with rehab.     Rehab Potential  Good    PT Frequency  2x / week    PT Duration  6 weeks    PT Treatment/Interventions  Patient/family education;ADLs/Self Care Home Management;Cryotherapy;Electrical Stimulation;Iontophoresis 4mg /ml Dexamethasone;Traction;Moist Heat;Ultrasound;Vasopneumatic Device;Passive range of motion;Dry needling;Manual techniques;Therapeutic activities;Therapeutic exercise;Neuromuscular re-education    PT Next Visit Plan  continue progressive ROM / postural strengthening for Rt shoulder.     PT Home Exercise Plan  Access Code: [email or web address]     Consulted and Agree with Plan of Care  Patient       Patient will benefit from skilled therapeutic intervention in order to improve the following deficits and impairments:  Pain, Improper body mechanics, Postural dysfunction, Increased fascial restricitons, Increased muscle spasms, Hypomobility, Decreased strength, Decreased range of motion, Decreased mobility, Decreased activity tolerance  Visit Diagnosis: Chronic right shoulder pain  Abnormal posture  Other symptoms and signs involving the musculoskeletal system  Stiffness of right shoulder, not elsewhere classified     Problem List Patient Active Problem  List   Diagnosis Date Noted  . Palpitations  08/03/2016  . Common migraine with intractable migraine 06/19/2016  . Vasovagal near syncope 06/18/2016  . Pre-syncope 06/18/2016  . Dizziness 04/08/2012  . Anemia associated with acute blood loss--due to adhesions and C/S 11/14/2011  . Status post repeat low transverse cesarean section and BTL 11/11/2011  . Pregnancy with history of caesarean section, antepartum 07/09/2011  . Menstrual cycle disorder 07/09/2011  . Gestational diabetes mellitus in pregnancy 07/09/2011  . AMA (advanced maternal age) multigravida 35+ 07/09/2011    Assyria Morreale Nilda Simmer PT, MPH  08/22/2018, 7:49 AM  [facility] [place], [place], [address] Phone: [phone]   Fax:  [phone]  Name: [name] MRN: [id number] Date of Birth: 1975-06-22

## 2018-08-22 NOTE — Patient Instructions (Addendum)
Trigger Point Dry Needling  . What is Trigger Point Dry Needling (DN)? o DN is a physical therapy technique used to treat muscle pain and dysfunction. Specifically, DN helps deactivate muscle trigger points (muscle knots).  o A thin filiform needle is used to penetrate the skin and stimulate the underlying trigger point. The goal is for a local twitch response (LTR) to occur and for the trigger point to relax. No medication of any kind is injected during the procedure.   . What Does Trigger Point Dry Needling Feel Like?  o The procedure feels different for each individual patient. Some patients report that they do not actually feel the needle enter the skin and overall the process is not painful. Very mild bleeding may occur. However, many patients feel a deep cramping in the muscle in which the needle was inserted. This is the local twitch response.   Marland Kitchen How Will I feel after the treatment? o Soreness is normal, and the onset of soreness may not occur for a few hours. Typically this soreness does not last longer than two days.  o Bruising is uncommon, however; ice can be used to decrease any possible bruising.  o In rare cases feeling tired or nauseous after the treatment is normal. In addition, your symptoms may get worse before they get better, this period will typically not last longer than 24 hours.   . What Can I do After My Treatment? o Increase your hydration by drinking more water for the next 24 hours. o You may place ice or heat on the areas treated that have become sore, however, do not use heat on inflamed or bruised areas. Heat often brings more relief post needling. o You can continue your regular activities, but vigorous activity is not recommended initially after the treatment for 24 hours. o DN is best combined with other physical therapy such as strengthening, stretching, and other therapies.  Access Code: [email or web address]  URL: [email or web address]  Date: 08/22/2018   Prepared by: Gillermo Murdoch   Exercises  Seated Cervical Retraction - 10 reps - 1 sets - 3x daily - 7x weekly  Standing Scapular Retraction - 10 reps - 1 sets - 10 hold - 3x daily - 7x weekly  Shoulder External Rotation and Scapular Retraction - 10 reps - 1 sets - hold - 3x daily - 7x weekly  Seated Shoulder Flexion AAROM with Pulley Behind - 10 reps - 1 sets - 10sec hold - 2x daily - 7x weekly  Standing Shoulder and Trunk Flexion at Table - 3 reps - 1 sets - 10sec hold - 2x daily - 7x weekly  Standing Shoulder Extension ROM with Dowel - 3-5 reps - 1 sets - 10sec hold - 2x daily - 7x weekly  Circular Shoulder Pendulum with Table Support - 30 reps - 1 sets - 2x daily - 7x weekly  Standing Shoulder External Rotation AAROM with Dowel - 3 reps - 1 sets - 30 sec hold - 2x daily - 7x weekly  Standing Shoulder External Rotation Stretch in Doorway - 3 reps - 1 sets - 10sec hold - 2x daily - 7x weekly  Doorway Pec Stretch at 60 Elevation - 3 reps - 1 sets - 20-30 sec hold - 2x daily - 7x weekly  Doorway Pec Stretch at 90 Degrees Abduction - 3 reps - 1 sets - 20-30 sec hold - 2x daily - 7x weekly  Corner Pec Major Stretch - 3 reps - 1 sets - 30 sec hold -  3-4x daily - 7x weekly  Standing Shoulder Internal Rotation Stretch with Towel - 5 reps - 1 sets - 10sec hold - 2-3x daily - 7x weekly   Today  Supine Lower Trunk Rotation - 3 reps - 1 sets - 30 sec hold - 2x daily - 7x weekly

## 2018-08-25 ENCOUNTER — Encounter: Payer: 59 | Admitting: Rehabilitative and Restorative Service Providers"

## 2018-08-26 IMAGING — MG 2D DIGITAL DIAGNOSTIC UNILATERAL LEFT MAMMOGRAM WITH CAD AND ADJ
6 series · 6 of 14 positions shown · non-contrast
Comparison: Previous exam(s).

CLINICAL DATA: 41-year-old female for further evaluation of
possible left breast asymmetry on screening mammogram.

EXAM:
2D DIGITAL DIAGNOSTIC UNILATERAL LEFT MAMMOGRAM WITH CAD AND ADJUNCT
TOMO

[L MLO]
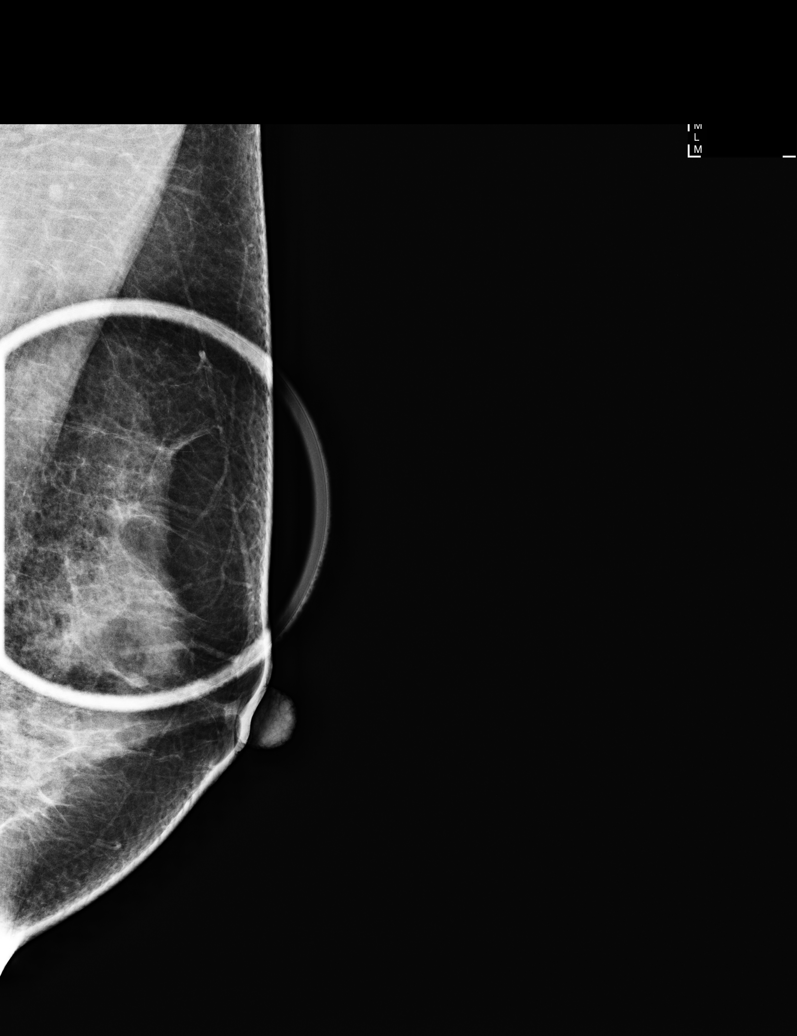

[L MLO synth-2D]
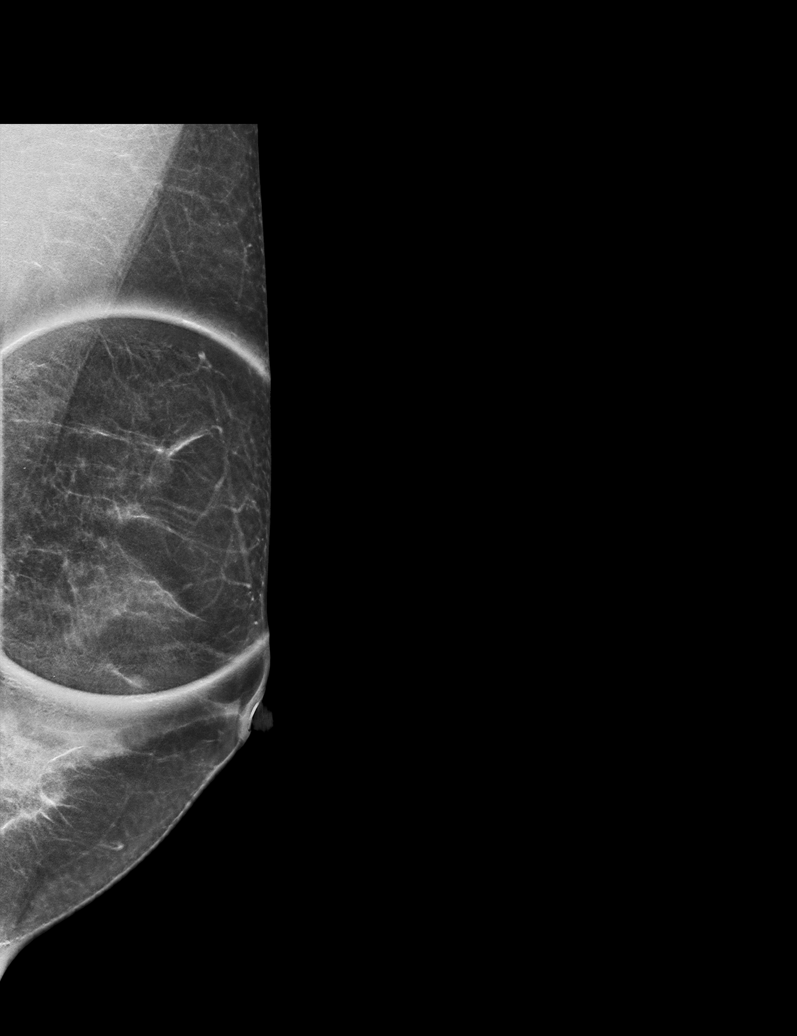

[L CC synth-2D]
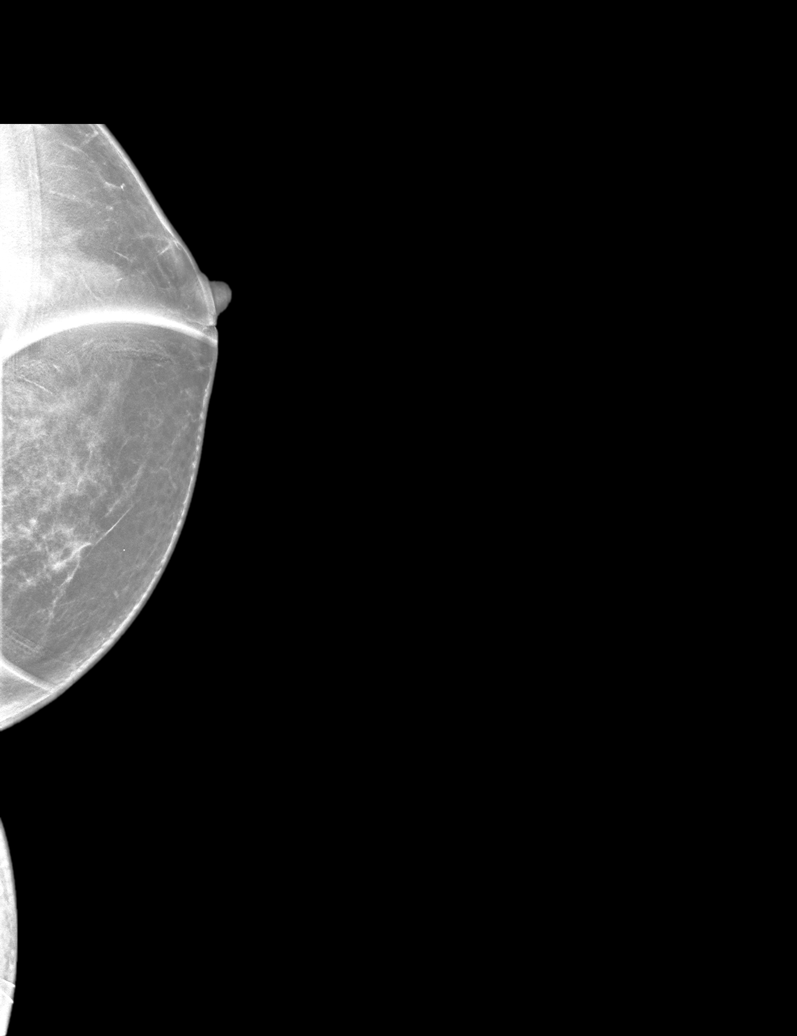

[L CC]
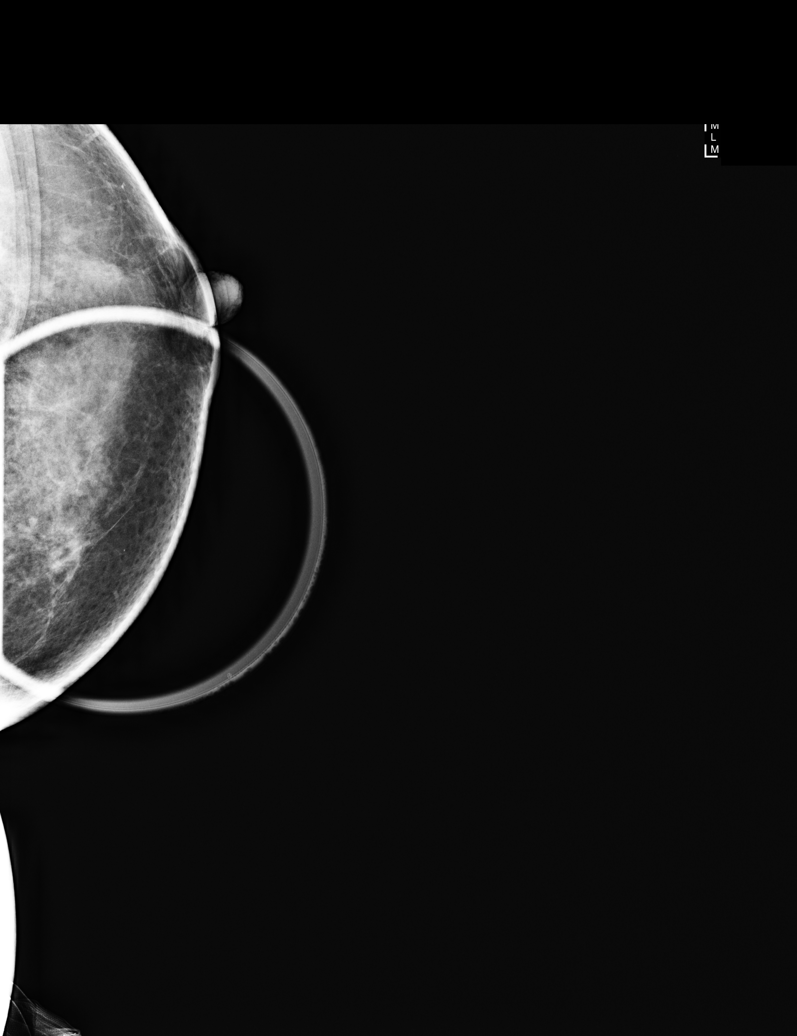

[L CC tomo · tomo slice 25/49.0]
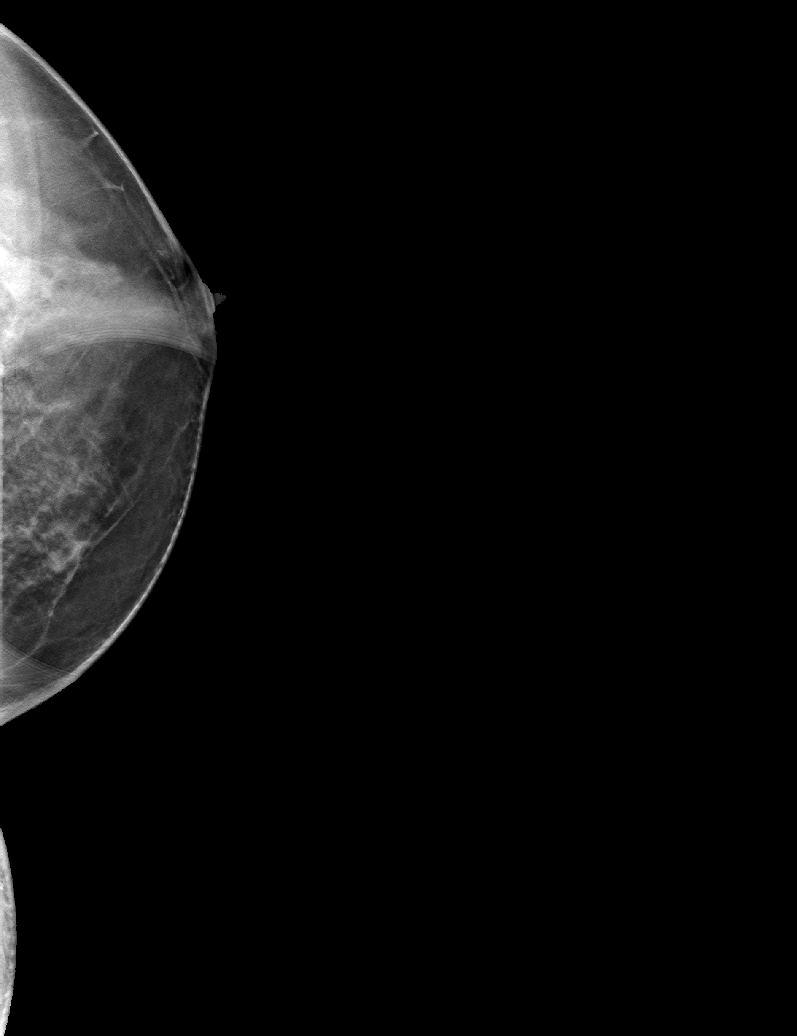

[L MLO tomo · tomo slice 27/54.0]
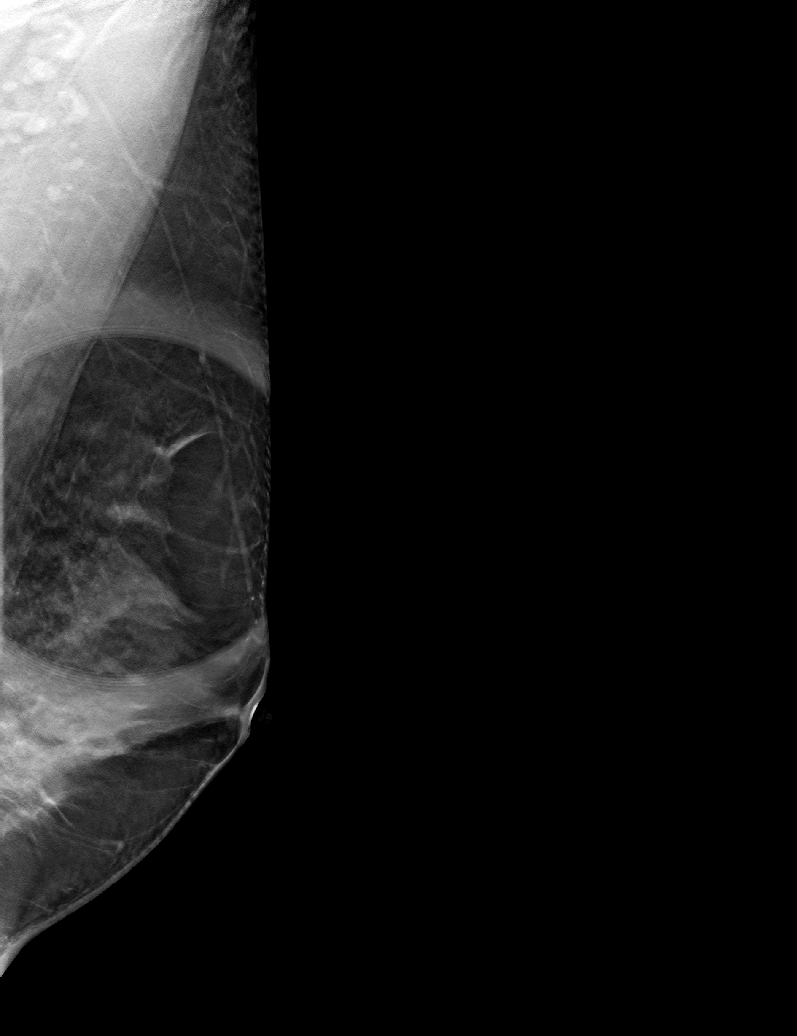

[6 of 14 positions shown; findings below may reference images not displayed]

ACR Breast Density Category c: The breast tissue is heterogeneously
dense, which may obscure small masses.
FINDINGS: 2D and 3D spot compression views of the left breast demonstrate no
persistent mass or distortion in the area of the screening study
finding. No abnormalities are identified.

Mammographic images were processed with CAD.
IMPRESSION: No persistent abnormality in the area of the screening study
finding.

RECOMMENDATION:
Bilateral screening mammograms in 1 year.

I have discussed the findings and recommendations with the patient.
Results were also provided in writing at the conclusion of the
visit. If applicable, a reminder letter will be sent to the patient
regarding the next appointment.

BI-RADS CATEGORY  1: Negative.

## 2018-08-29 ENCOUNTER — Encounter: Payer: 59 | Admitting: Rehabilitative and Restorative Service Providers"

## 2018-08-30 ENCOUNTER — Ambulatory Visit: Payer: 59 | Admitting: Rehabilitative and Restorative Service Providers"

## 2018-08-30 ENCOUNTER — Other Ambulatory Visit: Payer: Self-pay

## 2018-08-30 ENCOUNTER — Encounter: Payer: Self-pay | Admitting: Rehabilitative and Restorative Service Providers"

## 2018-08-30 DIAGNOSIS — R293 Abnormal posture: Secondary | ICD-10-CM | POA: Diagnosis not present

## 2018-08-30 DIAGNOSIS — M25511 Pain in right shoulder: Secondary | ICD-10-CM

## 2018-08-30 DIAGNOSIS — G8929 Other chronic pain: Secondary | ICD-10-CM | POA: Diagnosis not present

## 2018-08-30 DIAGNOSIS — R29898 Other symptoms and signs involving the musculoskeletal system: Secondary | ICD-10-CM | POA: Diagnosis not present

## 2018-08-30 DIAGNOSIS — M25611 Stiffness of right shoulder, not elsewhere classified: Secondary | ICD-10-CM

## 2018-08-30 NOTE — Therapy (Signed)
[facility] [place], [place], [address] Phone: [phone]   Fax:  [phone]  Physical Therapy Treatment  Patient Details  Name: [name] MRN: [id number] Date of Birth: [birth year] Referring Provider (PT): Dr Georgina Snell    Encounter Date: 08/30/2018  PT End of Session - 08/30/18 0905    Visit Number  6    Number of Visits  12    Date for PT Re-Evaluation  09/19/18    PT Start Time  0900    PT Stop Time  0957    PT Time Calculation (min)  57 min    Activity Tolerance  Patient tolerated treatment well       Past Medical History:  Diagnosis Date  . History of chicken pox   . History of gestational diabetes   . History of measles, mumps, or rubella   . No pertinent past medical history     Past Surgical History:  Procedure Laterality Date  . BREAST CYST EXCISION  2006  . CESAREAN SECTION    . TUBAL LIGATION      There were no vitals filed for this visit.  Subjective Assessment - 08/30/18 0905    Subjective  No pain at rest and sleeping OK. Still hurts when she gets to the end of the available range. ROM is increasing some. Tested for COVID19 last week and was negative. Quarantined for 1 week. No symptoms.     Currently in Pain?  No/denies         [facility] PT Assessment - 08/30/18 0001      Assessment   Medical Diagnosis  Rt shoulder dysfunction     Referring Provider (PT)  Dr Georgina Snell     Onset Date/Surgical Date  05/07/18    Hand Dominance  Right    Next MD Visit  as needed     Prior Therapy  none       AROM   Right Shoulder Extension  55 Degrees    Right Shoulder Flexion  134 Degrees    Right Shoulder ABduction  120 Degrees    Right Shoulder Internal Rotation  24 Degrees    Right Shoulder External Rotation  54 Degrees                   [facility] Adult PT Treatment/Exercise - 08/30/18 0001      Shoulder Exercises: Supine   External Rotation Limitations  hands behind head  30-40 sec x 2       Shoulder Exercises: Sidelying   Other Sidelying Exercises  stretch into IR with Lt hand on hip dropping elbow down - PT assist 10 sec x 3; pt performing 10 sec x 2 reps     Other Sidelying Exercises  abduction bringing Lt arm overhead pt assisting with Rt hand for stretch into abduction 10 -20 sec hold x 3 reps       Shoulder Exercises: Standing   Extension  Strengthening;Both;10 reps;Theraband    Theraband Level (Shoulder Extension)  Level 2 (Red)    Row  Strengthening;Both;10 reps;Theraband    Theraband Level (Shoulder Row)  Level 2 (Red)    Retraction  Strengthening;Both;10 reps;Theraband    Theraband Level (Shoulder Retraction)  Level 2 (Red)      Shoulder Exercises: Pulleys   Flexion  --   10 sec hold x 10 reps    Scaption  --   10 sec hold x 10 reps    Other Pulley  Exercises  IR with pulley 10 sec x 5 reps standing       Shoulder Exercises: Therapy Ball   Flexion  Both;5 reps   10-20 sec rolling ball up wall and rolling ball fwd on table     Shoulder Exercises: Stretch   Other Shoulder Stretches  doorway stretch - 3 positions 30 sec hold x 3 reps       Moist Heat Therapy   Number Minutes Moist Heat  15 Minutes    Moist Heat Location  Shoulder   Rt shoulder girdle      Electrical Stimulation   Electrical Stimulation Location  Rt shoulder girdle     Electrical Stimulation Action  IFC    Electrical Stimulation Parameters  to tolerance    Electrical Stimulation Goals  Pain;Tone      Manual Therapy   Manual therapy comments  pt supine and sidelying     Joint Mobilization  GH joint mobs - caudal; posteriorly supine; caudal/A/P in sitting UE abducted at 80 deg supported on pillow in table; joint glide in sitting     Soft tissue mobilization  STM through Rt pec, bicep, upper trap, subscap;  pt seated for IASTM to Rt pec, bicep, infraspin, teres maj, lateral deltoid, levator - to decrease fascial restrictions and pain.     Myofascial Release  Rt pec      Scapular Mobilization  Rt scapula     Passive ROM  Rt shoulder flexion; ER in scapular plane; extension; horizontal abduction     Manual Traction  through Rt UE in neutral position              PT Education - 08/30/18 0928    Education Details  HEP     Person(s) Educated  Patient    Methods  Explanation;Demonstration;Tactile cues;Verbal cues;Handout    Comprehension  Verbalized understanding;Returned demonstration;Verbal cues required;Tactile cues required          PT Long Term Goals - 08/08/18 1022      PT LONG TERM GOAL #1   Title  Improve posture and alignment with patient to demonstrate improve upright posture with posterior shoudler girdle engaged 6/29/202     Time  6    Period  Weeks    Status  New      PT LONG TERM GOAL #2   Title  Increase AROM Rt shoulder to equal Lt shoulder 09/19/2018    Time  6    Period  Weeks    Status  New      PT LONG TERM GOAL #3   Title  Patient reports return to normal functional activities with Rt UE with minimal to no limitations and pain no more than 1-2/10 on 0-10 scale 09/19/2018    Time  6    Period  Weeks    Status  New      PT LONG TERM GOAL #4   Title  Independent in HEP 09/19/2018    Time  6    Period  Weeks    Status  New      PT LONG TERM GOAL #5   Title  Improve FOTO to </= 32% limitaion 09/19/2018    Time  6    Period  Weeks    Status  New            Plan - 08/30/18 0905    Clinical Impression Statement  continues to have no pain at rest and is sleeping well. Still has  pain and limited ROM Rt shoulder but demonstrates good gains in AROM. Added exercises without difficulty. Continues to report pain with end range stretches. Will benefit from continued therapy to achireve maximum rehab potential.     Rehab Potential  Good    PT Frequency  2x / week    PT Duration  6 weeks    PT Treatment/Interventions  Patient/family education;ADLs/Self Care Home Management;Cryotherapy;Electrical Stimulation;Iontophoresis  4mg /ml Dexamethasone;Traction;Moist Heat;Ultrasound;Vasopneumatic Device;Passive range of motion;Dry needling;Manual techniques;Therapeutic activities;Therapeutic exercise;Neuromuscular re-education    PT Next Visit Plan  continue progressive ROM / postural strengthening for Rt shoulder.     PT Home Exercise Plan  Access Code: [email or web address]     Consulted and Agree with Plan of Care  Patient       Patient will benefit from skilled therapeutic intervention in order to improve the following deficits and impairments:  Pain, Improper body mechanics, Postural dysfunction, Increased fascial restricitons, Increased muscle spasms, Hypomobility, Decreased strength, Decreased range of motion, Decreased mobility, Decreased activity tolerance  Visit Diagnosis: Chronic right shoulder pain  Abnormal posture  Other symptoms and signs involving the musculoskeletal system  Stiffness of right shoulder, not elsewhere classified     Problem List Patient Active Problem List   Diagnosis Date Noted  . Palpitations 08/03/2016  . Common migraine with intractable migraine 06/19/2016  . Vasovagal near syncope 06/18/2016  . Pre-syncope 06/18/2016  . Dizziness 04/08/2012  . Anemia associated with acute blood loss--due to adhesions and C/S 11/14/2011  . Status post repeat low transverse cesarean section and BTL 11/11/2011  . Pregnancy with history of caesarean section, antepartum 07/09/2011  . Menstrual cycle disorder 07/09/2011  . Gestational diabetes mellitus in pregnancy 07/09/2011  . AMA (advanced maternal age) multigravida 35+ 07/09/2011    Koree Schopf Nilda Simmer PT, MPH  08/30/2018, 9:50 AM  [facility] [place], [place], [address] Phone: [phone]   Fax:  [phone]  Name: [name] MRN: [id number] Date of Birth: [birth year]

## 2018-08-30 NOTE — Patient Instructions (Signed)
Access Code: [email or web address]  URL: [email or web address]  Date: 08/30/2018  Prepared by: Gillermo Murdoch   Exercises  Seated Cervical Retraction - 10 reps - 1 sets - 3x daily - 7x weekly  Standing Scapular Retraction - 10 reps - 1 sets - 10 hold - 3x daily - 7x weekly  Shoulder External Rotation and Scapular Retraction - 10 reps - 1 sets - hold - 3x daily - 7x weekly  Seated Shoulder Flexion AAROM with Pulley Behind - 10 reps - 1 sets - 10sec hold - 2x daily - 7x weekly  Standing Shoulder and Trunk Flexion at Table - 3 reps - 1 sets - 10sec hold - 2x daily - 7x weekly  Standing Shoulder Extension ROM with Dowel - 3-5 reps - 1 sets - 10sec hold - 2x daily - 7x weekly  Circular Shoulder Pendulum with Table Support - 30 reps - 1 sets - 2x daily - 7x weekly  Standing Shoulder External Rotation AAROM with Dowel - 3 reps - 1 sets - 30 sec hold - 2x daily - 7x weekly  Standing Shoulder External Rotation Stretch in Doorway - 3 reps - 1 sets - 10sec hold - 2x daily - 7x weekly  Doorway Pec Stretch at 60 Elevation - 3 reps - 1 sets - 20-30 sec hold - 2x daily - 7x weekly  Doorway Pec Stretch at 90 Degrees Abduction - 3 reps - 1 sets - 20-30 sec hold - 2x daily - 7x weekly  Corner Pec Major Stretch - 3 reps - 1 sets - 30 sec hold - 3-4x daily - 7x weekly  Standing Shoulder Internal Rotation Stretch with Towel - 5 reps - 1 sets - 10sec hold - 2-3x daily - 7x weekly  Supine Lower Trunk Rotation - 3 reps - 1 sets - 30 sec hold - 2x daily - 7x weekly   Today  Shoulder External Rotation and Scapular Retraction with Resistance - 10 reps - 2-3 sets - 2x daily - 7x weekly  Scapular Retraction with Resistance - 10 reps - 2-3 sets - 2x daily - 7x weekly  Scapular Retraction with Resistance Advanced - 10 reps - 2-3 sets - 2x daily - 7x weekly  Standing Shoulder Flexion AAROM with Swiss Ball - 5 reps - 1 sets - 30 sec hold - 2x daily - 7x weekly  Supine Chest Stretch with Elbows Bent - 3 reps - 1 sets - 30-60  sec hold - 2x daily - 7x weekly

## 2018-09-07 ENCOUNTER — Ambulatory Visit: Payer: 59 | Admitting: Rehabilitative and Restorative Service Providers"

## 2018-09-07 ENCOUNTER — Other Ambulatory Visit: Payer: Self-pay

## 2018-09-07 ENCOUNTER — Encounter: Payer: Self-pay | Admitting: Rehabilitative and Restorative Service Providers"

## 2018-09-07 DIAGNOSIS — R293 Abnormal posture: Secondary | ICD-10-CM | POA: Diagnosis not present

## 2018-09-07 DIAGNOSIS — M25611 Stiffness of right shoulder, not elsewhere classified: Secondary | ICD-10-CM

## 2018-09-07 DIAGNOSIS — M25511 Pain in right shoulder: Secondary | ICD-10-CM | POA: Diagnosis not present

## 2018-09-07 DIAGNOSIS — R29898 Other symptoms and signs involving the musculoskeletal system: Secondary | ICD-10-CM | POA: Diagnosis not present

## 2018-09-07 DIAGNOSIS — G8929 Other chronic pain: Secondary | ICD-10-CM

## 2018-09-07 NOTE — Patient Instructions (Signed)
Cat / Cow Flow    Inhale, press spine toward ceiling like a Halloween cat. Keeping strength in arms and abdominals, exhale to soften spine through neutral and into cow pose. Open chest and arch back. Initiate movement between cat and cow at tailbone, one vertebrae at a time. Repeat __5-10__ times.  BACK: Child's Pose (Sciatica)    Sit in knee-chest position and reach arms forward. Separate knees for comfort. Hold position for _20-30 sec Repeat _3-5__ times. Do _1-2__ times per day.   bow and arrow with green theraband  Small ball at back of hand - ball on wall for small circles CW/CCW very slowly  Bring hand up and turn body - back more to wall

## 2018-09-07 NOTE — Therapy (Signed)
[facility] [place], [place], [address] Phone: [phone]   Fax:  [phone]  Physical Therapy Treatment  Patient Details  Name: [name] MRN: [id number] Date of Birth: 02/16/1976 Referring Provider (PT): Dr Georgina Snell    Encounter Date: 09/07/2018  PT End of Session - 09/07/18 1007    Visit Number  7    Number of Visits  12    Date for PT Re-Evaluation  09/19/18    PT Start Time  1004    PT Stop Time  1100    PT Time Calculation (min)  56 min    Activity Tolerance  Patient tolerated treatment well       Past Medical History:  Diagnosis Date  . History of chicken pox   . History of gestational diabetes   . History of measles, mumps, or rubella   . No pertinent past medical history     Past Surgical History:  Procedure Laterality Date  . BREAST CYST EXCISION  2006  . CESAREAN SECTION    . TUBAL LIGATION      There were no vitals filed for this visit.  Subjective Assessment - 09/07/18 1008    Subjective  No pain except when stretching into the end of range with exercises and with functional reaching or awkward positions.    Currently in Pain?  No/denies         [facility] PT Assessment - 09/07/18 0001      Assessment   Medical Diagnosis  Rt shoulder dysfunction     Referring Provider (PT)  Dr Georgina Snell     Onset Date/Surgical Date  05/07/18    Hand Dominance  Right    Next MD Visit  as needed     Prior Therapy  none       Palpation   Palpation comment  muscular tightness Rt > Lt ant/lat/post cervical musculature; upper trap; leveator; pecs; teres                    [facility] Adult PT Treatment/Exercise - 09/07/18 0001      Shoulder Exercises: Prone   Other Prone Exercises  cat cow x 5; child's pose stretch into flexion 30 sec x 2 reps       Shoulder Exercises: Standing   Extension  Strengthening;Both;10 reps;Theraband    Theraband Level (Shoulder Extension)  Level 3 (Green)     Row  Strengthening;Both;10 reps;Theraband    Theraband Level (Shoulder Row)  Level 3 (Green)    Row Limitations  bow and arrow green x 10     Retraction  Strengthening;Both;10 reps;Theraband    Theraband Level (Shoulder Retraction)  Level 2 (Red)    Other Standing Exercises  ball on wall dorsum of hand 20-30 sec small circles CW/CCW       Shoulder Exercises: Pulleys   Flexion  --   10 sec hold x 10 reps    Scaption  --   10 sec hold x 10 reps      Shoulder Exercises: Stretch   Other Shoulder Stretches  doorway stretch - 3 positions 30 sec hold x 3 reps       Moist Heat Therapy   Number Minutes Moist Heat  15 Minutes    Moist Heat Location  Shoulder   Rt shoulder girdle      Electrical Stimulation   Electrical Stimulation Location  Rt shoulder girdle     Electrical Stimulation Action  IFC  Electrical Stimulation Parameters  to tolerance     Electrical Stimulation Goals  Pain;Tone      Manual Therapy   Manual therapy comments  pt supine and sidelying     Joint Mobilization  GH joint mobs - caudal; posteriorly supine; caudal/A/P in sitting UE abducted at 80 deg supported on pillow in table; joint glide in sitting     Soft tissue mobilization  STM through Rt pec, bicep, upper trap, subscap;  pt seated for IASTM to Rt pec, bicep, infraspin, teres maj, lateral deltoid, levator - to decrease fascial restrictions and pain.     Myofascial Release  Rt pec     Scapular Mobilization  Rt scapula     Passive ROM  Rt shoulder flexion; ER in scapular plane; extension; horizontal abduction     Manual Traction  through Rt UE in neutral position              PT Education - 09/07/18 1050    Education Details  HEP    Person(s) Educated  Patient    Methods  Explanation;Demonstration;Tactile cues;Verbal cues;Handout    Comprehension  Verbalized understanding;Returned demonstration;Verbal cues required;Tactile cues required          PT Long Term Goals - 08/08/18 1022      PT  LONG TERM GOAL #1   Title  Improve posture and alignment with patient to demonstrate improve upright posture with posterior shoudler girdle engaged 6/29/202     Time  6    Period  Weeks    Status  New      PT LONG TERM GOAL #2   Title  Increase AROM Rt shoulder to equal Lt shoulder 09/19/2018    Time  6    Period  Weeks    Status  New      PT LONG TERM GOAL #3   Title  Patient reports return to normal functional activities with Rt UE with minimal to no limitations and pain no more than 1-2/10 on 0-10 scale 09/19/2018    Time  6    Period  Weeks    Status  New      PT LONG TERM GOAL #4   Title  Independent in HEP 09/19/2018    Time  6    Period  Weeks    Status  New      PT LONG TERM GOAL #5   Title  Improve FOTO to </= 32% limitaion 09/19/2018    Time  6    Period  Weeks    Status  New            Plan - 09/07/18 1008    Clinical Impression Statement  Patient continues to improve. She can now reach bra line but this remains tight and difficult. She is working on exercises at home but does not push end range stretch. Patient tolerates ROM and joint mobs moderately well with limited tolerance for end range stretch - improving with this. Patient is progressing well toward stated goals of therapy. She will benefit from continued PT to address remaining limitations with ROM. She was encouraged to work hard on stretching at home.    Rehab Potential  Good    PT Frequency  2x / week    PT Duration  6 weeks    PT Treatment/Interventions  Patient/family education;ADLs/Self Care Home Management;Cryotherapy;Electrical Stimulation;Iontophoresis 4mg /ml Dexamethasone;Traction;Moist Heat;Ultrasound;Vasopneumatic Device;Passive range of motion;Dry needling;Manual techniques;Therapeutic activities;Therapeutic exercise;Neuromuscular re-education    PT Next Visit Plan  continue  progressive ROM / postural strengthening for Rt shoulder. Consider DN if muscular tightness persists    PT Home Exercise  Plan  Access Code: [email or web address]     Consulted and Agree with Plan of Care  Patient       Patient will benefit from skilled therapeutic intervention in order to improve the following deficits and impairments:  Pain, Improper body mechanics, Postural dysfunction, Increased fascial restricitons, Increased muscle spasms, Hypomobility, Decreased strength, Decreased range of motion, Decreased mobility, Decreased activity tolerance  Visit Diagnosis: 1. Chronic right shoulder pain   2. Abnormal posture   3. Other symptoms and signs involving the musculoskeletal system   4. Stiffness of right shoulder, not elsewhere classified        Problem List Patient Active Problem List   Diagnosis Date Noted  . Palpitations 08/03/2016  . Common migraine with intractable migraine 06/19/2016  . Vasovagal near syncope 06/18/2016  . Pre-syncope 06/18/2016  . Dizziness 04/08/2012  . Anemia associated with acute blood loss--due to adhesions and C/S 11/14/2011  . Status post repeat low transverse cesarean section and BTL 11/11/2011  . Pregnancy with history of caesarean section, antepartum 07/09/2011  . Menstrual cycle disorder 07/09/2011  . Gestational diabetes mellitus in pregnancy 07/09/2011  . AMA (advanced maternal age) multigravida 35+ 07/09/2011    Keiondra Brookover Nilda Simmer PT, MPH  09/07/2018, 10:58 AM  [facility] [place], [place], [address] Phone: [phone]   Fax:  [phone]  Name: [name] MRN: [id number] Date of Birth: 04-16-1975

## 2018-09-12 ENCOUNTER — Ambulatory Visit: Payer: 59 | Admitting: Rehabilitative and Restorative Service Providers"

## 2018-09-12 ENCOUNTER — Encounter: Payer: Self-pay | Admitting: Rehabilitative and Restorative Service Providers"

## 2018-09-12 ENCOUNTER — Other Ambulatory Visit: Payer: Self-pay

## 2018-09-12 DIAGNOSIS — G8929 Other chronic pain: Secondary | ICD-10-CM

## 2018-09-12 DIAGNOSIS — M25511 Pain in right shoulder: Secondary | ICD-10-CM | POA: Diagnosis not present

## 2018-09-12 DIAGNOSIS — R29898 Other symptoms and signs involving the musculoskeletal system: Secondary | ICD-10-CM | POA: Diagnosis not present

## 2018-09-12 DIAGNOSIS — M25611 Stiffness of right shoulder, not elsewhere classified: Secondary | ICD-10-CM

## 2018-09-12 DIAGNOSIS — R293 Abnormal posture: Secondary | ICD-10-CM

## 2018-09-12 NOTE — Patient Instructions (Signed)

## 2018-09-12 NOTE — Therapy (Addendum)
[facility] [place], [place], [address] Phone: [phone]   Fax:  [phone]  Physical Therapy Treatment  Patient Details  Name: [name] MRN: [id number] Date of Birth: 1975-08-14 Referring Provider (PT): Dr Georgina Snell    Encounter Date: 09/12/2018  PT End of Session - 09/12/18 0904    Visit Number  8    Number of Visits  12    Date for PT Re-Evaluation  09/19/18    PT Start Time  0900    PT Stop Time  0958    PT Time Calculation (min)  58 min    Activity Tolerance  Patient tolerated treatment well       Past Medical History:  Diagnosis Date  . History of chicken pox   . History of gestational diabetes   . History of measles, mumps, or rubella   . No pertinent past medical history     Past Surgical History:  Procedure Laterality Date  . BREAST CYST EXCISION  2006  . CESAREAN SECTION    . TUBAL LIGATION      There were no vitals filed for this visit.  Subjective Assessment - 09/12/18 0905    Subjective  persistent tightness at end ranges - ROm continues to increase but tightness is still there. Ready to try the DN    Currently in Pain?  No/denies                       [facility] Adult PT Treatment/Exercise - 09/12/18 0001      Shoulder Exercises: Pulleys   Flexion  --   10 sec hold x 10 reps    Scaption  --   10 sec hold x 10 reps      Shoulder Exercises: Stretch   Other Shoulder Stretches  doorway stretch - 3 positions 30 sec hold x 3 reps       Moist Heat Therapy   Number Minutes Moist Heat  15 Minutes    Moist Heat Location  Shoulder   Rt shoulder girdle      Electrical Stimulation   Electrical Stimulation Location  Rt shoulder girdle     Electrical Stimulation Action  IFC    Electrical Stimulation Parameters  to tolerance    Electrical Stimulation Goals  Pain;Tone      Manual Therapy   Manual therapy comments  pt supine and sidelying     Joint Mobilization   GH joint mobs - caudal; posteriorly supine; caudal/A/P in sitting UE abducted at 80 deg supported on pillow in table; joint glide in sitting     Soft tissue mobilization  STM through Rt pec, bicep, upper trap, subscap;  pt seated for IASTM to Rt pec, bicep, infraspin, teres maj, lateral deltoid, levator - to decrease fascial restrictions and pain.     Myofascial Release  Rt pec     Scapular Mobilization  Rt scapula     Passive ROM  Rt shoulder flexion; ER in scapular plane; extension; horizontal abduction     Manual Traction  through Rt UE in neutral position        Trial of DN - Rt pecs; anterior deltoid; teres  Good release of musculature tightness noted with DN and manual work  Patient tolerated treatment well       PT Education - 09/12/18 0908    Education Details  DN    Person(s) Educated  Patient    Methods  Explanation    Comprehension  Verbalized understanding          PT Long Term Goals - 08/08/18 1022      PT LONG TERM GOAL #1   Title  Improve posture and alignment with patient to demonstrate improve upright posture with posterior shoudler girdle engaged 6/29/202     Time  6    Period  Weeks    Status  New      PT LONG TERM GOAL #2   Title  Increase AROM Rt shoulder to equal Lt shoulder 09/19/2018    Time  6    Period  Weeks    Status  New      PT LONG TERM GOAL #3   Title  Patient reports return to normal functional activities with Rt UE with minimal to no limitations and pain no more than 1-2/10 on 0-10 scale 09/19/2018    Time  6    Period  Weeks    Status  New      PT LONG TERM GOAL #4   Title  Independent in HEP 09/19/2018    Time  6    Period  Weeks    Status  New      PT LONG TERM GOAL #5   Title  Improve FOTO to </= 32% limitaion 09/19/2018    Time  6    Period  Weeks    Status  New            Plan - 09/12/18 0904    Clinical Impression Statement  Continued gradual improvement in ROM. Only painful at end range. Tolerated DN well with  demo of increased ROM following treatment. May benefit from continued DN to decrease muscular tightness and improve Rt shoulder ROM.    Rehab Potential  Good    PT Frequency  2x / week    PT Duration  6 weeks    PT Treatment/Interventions  Patient/family education;ADLs/Self Care Home Management;Cryotherapy;Electrical Stimulation;Iontophoresis 4mg /ml Dexamethasone;Traction;Moist Heat;Ultrasound;Vasopneumatic Device;Passive range of motion;Dry needling;Manual techniques;Therapeutic activities;Therapeutic exercise;Neuromuscular re-education    PT Next Visit Plan  continue progressive ROM / postural strengthening for Rt shoulder. Assess response to DN for muscular tightness    PT Home Exercise Plan  Access Code: [email or web address]     Consulted and Agree with Plan of Care  Patient       Patient will benefit from skilled therapeutic intervention in order to improve the following deficits and impairments:  Pain, Improper body mechanics, Postural dysfunction, Increased fascial restricitons, Increased muscle spasms, Hypomobility, Decreased strength, Decreased range of motion, Decreased mobility, Decreased activity tolerance  Visit Diagnosis: 1. Chronic right shoulder pain   2. Abnormal posture   3. Other symptoms and signs involving the musculoskeletal system   4. Stiffness of right shoulder, not elsewhere classified        Problem List Patient Active Problem List   Diagnosis Date Noted  . Palpitations 08/03/2016  . Common migraine with intractable migraine 06/19/2016  . Vasovagal near syncope 06/18/2016  . Pre-syncope 06/18/2016  . Dizziness 04/08/2012  . Anemia associated with acute blood loss--due to adhesions and C/S 11/14/2011  . Status post repeat low transverse cesarean section and BTL 11/11/2011  . Pregnancy with history of caesarean section, antepartum 07/09/2011  . Menstrual cycle disorder 07/09/2011  . Gestational diabetes mellitus in pregnancy 07/09/2011  . AMA (advanced maternal age)  multigravida 35+ 07/09/2011    Padraic Marinos Nilda Simmer PT, MPH  09/12/2018, 9:46 AM  [facility] [place], [place], [address] Phone: [phone]   Fax:  [phone]  Name: [name] MRN: [id number] Date of Birth: 1975/05/09

## 2018-09-16 ENCOUNTER — Other Ambulatory Visit: Payer: Self-pay

## 2018-09-16 ENCOUNTER — Encounter: Payer: Self-pay | Admitting: Rehabilitative and Restorative Service Providers"

## 2018-09-16 ENCOUNTER — Ambulatory Visit: Payer: 59 | Admitting: Rehabilitative and Restorative Service Providers"

## 2018-09-16 DIAGNOSIS — R29898 Other symptoms and signs involving the musculoskeletal system: Secondary | ICD-10-CM | POA: Diagnosis not present

## 2018-09-16 DIAGNOSIS — M25611 Stiffness of right shoulder, not elsewhere classified: Secondary | ICD-10-CM | POA: Diagnosis not present

## 2018-09-16 DIAGNOSIS — R293 Abnormal posture: Secondary | ICD-10-CM | POA: Diagnosis not present

## 2018-09-16 DIAGNOSIS — G8929 Other chronic pain: Secondary | ICD-10-CM

## 2018-09-16 DIAGNOSIS — M25511 Pain in right shoulder: Secondary | ICD-10-CM

## 2018-09-16 NOTE — Therapy (Addendum)
[facility] [place], [place], [address] Phone: [phone]   Fax:  [phone]  Physical Therapy Treatment  Patient Details  Name: [name] MRN: [id number] Date of Birth: [birth year] Referring Provider (PT): Dr Georgina Snell    Encounter Date: 09/16/2018  PT End of Session - 09/16/18 1532    Visit Number  9    Number of Visits  12    Date for PT Re-Evaluation  09/19/18    PT Start Time  4650    PT Stop Time  1627    PT Time Calculation (min)  57 min    Activity Tolerance  Patient tolerated treatment well       Past Medical History:  Diagnosis Date  . History of chicken pox   . History of gestational diabetes   . History of measles, mumps, or rubella   . No pertinent past medical history     Past Surgical History:  Procedure Laterality Date  . BREAST CYST EXCISION  2006  . CESAREAN SECTION    . TUBAL LIGATION      There were no vitals filed for this visit.  Subjective Assessment - 09/16/18 1533    Subjective  Improving some - DN helped. Some soreness. Wants to do more DN.    Currently in Pain?  No/denies                       [facility] Adult PT Treatment/Exercise - 09/16/18 0001      Shoulder Exercises: Pulleys   Flexion  --   10 sec hold x 10 reps    Scaption  --   10 sec hold x 10 reps      Shoulder Exercises: Stretch   Corner Stretch  3 reps;30 seconds    Other Shoulder Stretches  cat cow x 3 reps; child's pose for stretch through the shoulder x 3 reps 20-30 sec hol    Other Shoulder Stretches  doorway stretch - 3 positions 30 sec hold x 3 reps       Moist Heat Therapy   Number Minutes Moist Heat  15 Minutes    Moist Heat Location  Shoulder   Rt shoulder girdle      Electrical Stimulation   Electrical Stimulation Location  Rt shoulder girdle     Electrical Stimulation Action  IFC    Electrical Stimulation Parameters  to tolerance     Electrical Stimulation Goals   Pain;Tone      Manual Therapy   Manual therapy comments  pt supine and sidelying     Joint Mobilization  GH joint mobs - caudal; posteriorly supine; caudal/A/P in sitting UE abducted at 80 deg supported on pillow in table; joint glide in sitting     Soft tissue mobilization  STM through Rt pec, bicep, upper trap, subscap;  pt seated for IASTM to Rt pec, bicep, infraspin, teres maj, lateral deltoid, levator - to decrease fascial restrictions and pain.     Myofascial Release  Rt posterior shoulder girdle     Scapular Mobilization  Rt scapula     Passive ROM  Rt shoulder flexion; ER in scapular plane; extension; horizontal abduction     Manual Traction  through Rt UE in neutral position        Trigger Point Dry Needling - 09/16/18 0001    Consent Given?  Yes    Education Handout Provided  Yes    Other Dry  Needling  Rt side     Upper Trapezius Response  Palpable increased muscle length    Pectoralis Major Response  Palpable increased muscle length    Pectoralis Minor Response  Palpable increased muscle length    Subscapularis Response  Palpable increased muscle length    Longissimus Response  Palpable increased muscle length   thoracic    Teres major Response  Palpable increased muscle length    Teres minor Response  Palpable increased muscle length    Biceps Response  Palpable increased muscle length           PT Education - 09/16/18 1627    Education Details  DN    Person(s) Educated  Patient    Methods  Explanation    Comprehension  Verbalized understanding          PT Long Term Goals - 08/08/18 1022      PT LONG TERM GOAL #1   Title  Improve posture and alignment with patient to demonstrate improve upright posture with posterior shoudler girdle engaged 6/29/202     Time  6    Period  Weeks    Status  New      PT LONG TERM GOAL #2   Title  Increase AROM Rt shoulder to equal Lt shoulder 09/19/2018    Time  6    Period  Weeks    Status  New      PT LONG TERM GOAL  #3   Title  Patient reports return to normal functional activities with Rt UE with minimal to no limitations and pain no more than 1-2/10 on 0-10 scale 09/19/2018    Time  6    Period  Weeks    Status  New      PT LONG TERM GOAL #4   Title  Independent in HEP 09/19/2018    Time  6    Period  Weeks    Status  New      PT LONG TERM GOAL #5   Title  Improve FOTO to </= 32% limitaion 09/19/2018    Time  6    Period  Weeks    Status  New            Plan - 09/16/18 1532    Clinical Impression Statement  Good response to DN and continued manual work. Gaining functional mobility and ROM.    Rehab Potential  Good    PT Frequency  2x / week    PT Duration  6 weeks    PT Treatment/Interventions  Patient/family education;ADLs/Self Care Home Management;Cryotherapy;Electrical Stimulation;Iontophoresis 12m/ml Dexamethasone;Traction;Moist Heat;Ultrasound;Vasopneumatic Device;Passive range of motion;Dry needling;Manual techniques;Therapeutic activities;Therapeutic exercise;Neuromuscular re-education    PT Next Visit Plan  continue progressive ROM / postural strengthening for Rt shoulder. Coninue DN for muscular tightness    PT Home Exercise Plan  Access Code: [email or web address]     Consulted and Agree with Plan of Care  Patient       Patient will benefit from skilled therapeutic intervention in order to improve the following deficits and impairments:  Pain, Improper body mechanics, Postural dysfunction, Increased fascial restricitons, Increased muscle spasms, Hypomobility, Decreased strength, Decreased range of motion, Decreased mobility, Decreased activity tolerance  Visit Diagnosis: 1. Chronic right shoulder pain   2. Abnormal posture   3. Other symptoms and signs involving the musculoskeletal system   4. Stiffness of right shoulder, not elsewhere classified        Problem List Patient Active Problem List  Diagnosis Date Noted  . Palpitations 08/03/2016  . Common migraine with intractable  migraine 06/19/2016  . Vasovagal near syncope 06/18/2016  . Pre-syncope 06/18/2016  . Dizziness 04/08/2012  . Anemia associated with acute blood loss--due to adhesions and C/S 11/14/2011  . Status post repeat low transverse cesarean section and BTL 11/11/2011  . Pregnancy with history of caesarean section, antepartum 07/09/2011  . Menstrual cycle disorder 07/09/2011  . Gestational diabetes mellitus in pregnancy 07/09/2011  . AMA (advanced maternal age) multigravida 35+ 07/09/2011    Celyn Nilda Simmer PT, MPH  09/16/2018, 4:29 PM  [facility] [place], [place], [address] Phone: [phone]   Fax:  [phone]  Name: [name] MRN: [id number] Date of Birth: 1975/09/01  PHYSICAL THERAPY DISCHARGE SUMMARY  Visits from Start of Care: 9   Current functional level related to goals / functional outcomes: See last progress note for discharge status    Remaining deficits: Unknown - needs to continue with HEP    Education / Equipment: HEP  Plan: Patient agrees to discharge.  Patient goals were met. Patient is being discharged due to being pleased with the current functional level.  ?????    Celyn P. Helene Kelp PT, MPH 10/18/18 10:23 AM

## 2018-09-16 NOTE — Patient Instructions (Signed)

## 2018-09-19 ENCOUNTER — Encounter: Payer: Self-pay | Admitting: Rehabilitative and Restorative Service Providers"

## 2018-10-05 ENCOUNTER — Telehealth: Payer: Self-pay | Admitting: Rehabilitative and Restorative Service Providers"

## 2018-10-05 NOTE — Telephone Encounter (Signed)
Patient was last seen 09/16/2018 and has not scheduled additional appointments. LM for [facility] asking that she call to let us know if she wanted to continue with therapy or discharge to independent HEP.   Celyn P. Helene Kelp PT, MPH 10/05/18 12:55 PM

## 2018-12-22 ENCOUNTER — Telehealth: Payer: Self-pay | Admitting: Family Medicine

## 2018-12-22 NOTE — Telephone Encounter (Signed)
LVM TO CALL BACK TO [facility] APPT FOR CPE

## 2019-01-14 NOTE — Patient Instructions (Addendum)
It was nice to see you again today- I will be in touch with your labs asap  Please go to the ground floor to have a chest x-ray and mammogram if possible on the way out I will have you follow-up with cardiology and neurology about your symptoms of chest pressure and dizziness    Health Maintenance, Female Adopting a healthy lifestyle and getting preventive care are important in promoting health and wellness. Ask your health care provider about:  The right schedule for you to have regular tests and exams.  Things you can do on your own to prevent diseases and keep yourself healthy. What should I know about diet, weight, and exercise? Eat a healthy diet   Eat a diet that includes plenty of vegetables, fruits, low-fat dairy products, and lean protein.  Do not eat a lot of foods that are high in solid fats, added sugars, or sodium. Maintain a healthy weight Body mass index (BMI) is used to identify weight problems. It estimates body fat based on height and weight. Your health care provider can help determine your BMI and help you achieve or maintain a healthy weight. Get regular exercise Get regular exercise. This is one of the most important things you can do for your health. Most adults should:  Exercise for at least 150 minutes each week. The exercise should increase your heart rate and make you sweat (moderate-intensity exercise).  Do strengthening exercises at least twice a week. This is in addition to the moderate-intensity exercise.  Spend less time sitting. Even light physical activity can be beneficial. Watch cholesterol and blood lipids Have your blood tested for lipids and cholesterol at 42 years of age, then have this test every 5 years. Have your cholesterol levels checked more often if:  Your lipid or cholesterol levels are high.  You are older than 42 years of age.  You are at high risk for heart disease. What should I know about cancer screening? Depending on your  health history and family history, you may need to have cancer screening at various ages. This may include screening for:  Breast cancer.  Cervical cancer.  Colorectal cancer.  Skin cancer.  Lung cancer. What should I know about heart disease, diabetes, and high blood pressure? Blood pressure and heart disease  High blood pressure causes heart disease and increases the risk of stroke. This is more likely to develop in people who have high blood pressure readings, are of African descent, or are overweight.  Have your blood pressure checked: ? Every 3-5 years if you are 36-14 years of age. ? Every year if you are 30 years old or older. Diabetes Have regular diabetes screenings. This checks your fasting blood sugar level. Have the screening done:  Once every three years after age 14 if you are at a normal weight and have a low risk for diabetes.  More often and at a younger age if you are overweight or have a high risk for diabetes. What should I know about preventing infection? Hepatitis B If you have a higher risk for hepatitis B, you should be screened for this virus. Talk with your health care provider to find out if you are at risk for hepatitis B infection. Hepatitis C Testing is recommended for:  Everyone born from [birth year] through 1965.  Anyone with known risk factors for hepatitis C. Sexually transmitted infections (STIs)  Get screened for STIs, including gonorrhea and chlamydia, if: ? You are sexually active and are younger than  42 years of age. ? You are older than 42 years of age and your health care provider tells you that you are at risk for this type of infection. ? Your sexual activity has changed since you were last screened, and you are at increased risk for chlamydia or gonorrhea. Ask your health care provider if you are at risk.  Ask your health care provider about whether you are at high risk for HIV. Your health care provider may recommend a prescription  medicine to help prevent HIV infection. If you choose to take medicine to prevent HIV, you should first get tested for HIV. You should then be tested every 3 months for as long as you are taking the medicine. Pregnancy  If you are about to stop having your period (premenopausal) and you may become pregnant, seek counseling before you get pregnant.  Take 400 to 800 micrograms (mcg) of folic acid every day if you become pregnant.  Ask for birth control (contraception) if you want to prevent pregnancy. Osteoporosis and menopause Osteoporosis is a disease in which the bones lose minerals and strength with aging. This can result in bone fractures. If you are 1 years old or older, or if you are at risk for osteoporosis and fractures, ask your health care provider if you should:  Be screened for bone loss.  Take a calcium or vitamin D supplement to lower your risk of fractures.  Be given hormone replacement therapy (HRT) to treat symptoms of menopause. Follow these instructions at home: Lifestyle  Do not use any products that contain nicotine or tobacco, such as cigarettes, e-cigarettes, and chewing tobacco. If you need help quitting, ask your health care provider.  Do not use street drugs.  Do not share needles.  Ask your health care provider for help if you need support or information about quitting drugs. Alcohol use  Do not drink alcohol if: ? Your health care provider tells you not to drink. ? You are pregnant, may be pregnant, or are planning to become pregnant.  If you drink alcohol: ? Limit how much you use to 0-1 drink a day. ? Limit intake if you are breastfeeding.  Be aware of how much alcohol is in your drink. In the [place] one drink equals one 12 oz bottle of beer (355 mL), one 5 oz glass of wine (148 mL), or one 1 oz glass of hard liquor (44 mL). General instructions  Schedule regular health, dental, and eye exams.  Stay current with your vaccines.  Tell your health  care provider if: ? You often feel depressed. ? You have ever been abused or do not feel safe at home. Summary  Adopting a healthy lifestyle and getting preventive care are important in promoting health and wellness.  Follow your health care provider's instructions about healthy diet, exercising, and getting tested or screened for diseases.  Follow your health care provider's instructions on monitoring your cholesterol and blood pressure. This information is not intended to replace advice given to you by your health care provider. Make sure you discuss any questions you have with your health care provider. Document Released: 09/22/2010 Document Revised: 03/02/2018 Document Reviewed: 03/02/2018 Elsevier Patient Education  2020 [employer].

## 2019-01-14 NOTE — Progress Notes (Addendum)
[facility] [place], [place], [place] [address] [phone]  Date:  01/16/2019   Name:  [name]   DOB:  1975/04/10   MRN:  [id number]  PCP:  Darreld Mclean, MD    Chief Complaint: Annual Exam   History of Present Illness:  [name] is a 43 y.o. very pleasant female patient who presents with the following:  Here today for physical exam History of palpitations, gestational diabetes, migraine headache Last seen by myself about 1 year ago with concern about her vision  She has 3 school-aged children, status post BTL- 60, 11 and 66 yo.  They are all learning virtually right now.   She is also working - she is mostly from home but there is some stress   Most recent labs 1 year ago, her A1c was normal at that time   Flu vaccine- done at work  Tetanus due next year Pap due next year mammo is due soon - will order at med center to have done at her convenience  She notes that she sometimes feels dizzy- describes as vertigo This has been present for 9 months or so She may notice more if she is moving around or exercising She notes this with certain activities like reaching over her head into a kitchen cabinet  She notes history of degenerative changes in her neck- see plain films from May of this year  She was having right shoulder pain in approx February of this year, she saw sports med and then PT She feels like her chest can be tight, "like an elephant, like pressure pain" for the last 9 months as well  This can occur with exertion like lifting a patient.  This can also occur with cardio exercise such as walking on her treadmill   She is not having any chest sx now.   She did see cardiology and had an echo in 2018 Her GF had a heart attack in his 48s  She does have an uncle with early cardiac arrest in his 57s  She also notes a headache for 2 years I did sent her to neurology - she saw Dr  Jannifer Franklin about 2 years ago but has not followed up since   LMP was 2 weeks ago She is s/p BTL  She also notes that sometimes her right index and long finger will hurt when she wakes up in the morning- wonders if her uric aid is high     Patient Active Problem List   Diagnosis Date Noted  . Palpitations 08/03/2016  . Common migraine with intractable migraine 06/19/2016  . Vasovagal near syncope 06/18/2016  . Pre-syncope 06/18/2016  . Dizziness 04/08/2012  . Anemia associated with acute blood loss--due to adhesions and C/S 11/14/2011  . Status post repeat low transverse cesarean section and BTL 11/11/2011  . Menstrual cycle disorder 07/09/2011  . Gestational diabetes mellitus in pregnancy 07/09/2011    Past Medical History:  Diagnosis Date  . History of chicken pox   . History of gestational diabetes   . History of measles, mumps, or rubella   . No pertinent past medical history     Past Surgical History:  Procedure Laterality Date  . BREAST CYST EXCISION  2006  . CESAREAN SECTION    . TUBAL LIGATION      Social History   Tobacco Use  . Smoking status: Never Smoker  . Smokeless  tobacco: Never Used  Substance Use Topics  . Alcohol use: Yes    Comment: Rarely- red wine  . Drug use: No    Family History  Problem Relation Age of Onset  . Cancer Mother        cervical  . Diabetes Mother   . Hypertension Mother   . Arthritis Other   . Diabetes Other   . Hyperlipidemia Other   . Hypertension Other   . Coronary artery disease Maternal Grandfather   . Asthma Brother   . Cancer Maternal Aunt        colon    No Known Allergies  Medication list has been reviewed and updated.  Current Outpatient Medications on File Prior to Visit  Medication Sig Dispense Refill  . diclofenac sodium (VOLTAREN) 1 % GEL Apply 2 g topically 4 (four) times daily. To affected joint. 100 g 11  . GLUTATHIONE PO Take 1 capsule (500 mg) by mouth daily    . ibuprofen (ADVIL) 200 MG tablet  Take 200 mg by mouth every 6 (six) hours as needed.    . Nutritional Supplements (GRAPESEED EXTRACT PO) Take by mouth daily.    . vitamin C (ASCORBIC ACID) 500 MG tablet Take 500 mg by mouth daily.    . vitamin E 1000 UNIT capsule Take 1,000 Units by mouth daily.     No current facility-administered medications on file prior to visit.     Review of Systems:  As per HPI- otherwise negative.   Physical Examination: Vitals:   01/16/19 0857  BP: 122/80  Pulse: 80  Resp: 16  Temp: (!) 96.7 F (35.9 C)  SpO2: 97%   Vitals:   01/16/19 0857  Weight: 129 lb (58.5 kg)  Height: 5\' 4"  (1.626 m)   Body mass index is 22.14 kg/m. Ideal Body Weight: Weight in (lb) to have BMI = 25: 145.3  GEN: WDWN, NAD, Non-toxic, A & O x 3, slim build, looks well HEENT: Atraumatic, Normocephalic. Neck supple. No masses, No LAD.  TM wnl bilatearlly  Ears and Nose: No external deformity. CV: RRR, No M/G/R. No JVD. No thrill. No extra heart sounds. PULM: CTA B, no wheezes, crackles, rhonchi. No retractions. No resp. distress. No accessory muscle use. ABD: S, NT, ND, +BS. No rebound. No HSM. EXTR: No c/c/e NEURO Normal gait.  Normal strength, sensation and DTR of all limbs, negative romberg PSYCH: Normally interactive. Conversant. Not depressed or anxious appearing.  Calm demeanor.   EKG: abnormal, T wave abnl widespread  C/w tracing from April 2020 significant change is noted  I went back to room to discuss EKG, and pt now states she is having active chest pressure   Orthostatics negative today  Assessment and Plan: Physical exam  Screening for deficiency anemia - Plan: CBC  Screening for diabetes mellitus - Plan: Comprehensive metabolic panel, Hemoglobin A1c  Screening for hyperlipidemia - Plan: Lipid panel  Screening for thyroid disorder - Plan: TSH  Chest tightness - Plan: EKG 12-Lead, DG Chest 2 View, CANCELED: Troponin I  Encounter for screening mammogram for malignant neoplasm of  breast - Plan: MM 3D SCREEN BREAST BILATERAL  Pain of finger of right hand - Plan: Uric acid  Here today for a CPE, but also has complaint of vertigo with exercise, chest tightness and pressure with exercise and finally active chest pressure now with EKG change  Advised that she needs to be seen in the ER due to her acute sx and she agrees She  declines a Oceanographer, will have her routine BW drawn and then go to the ER I had ordered a chest film but will cancel assuming this will be done in the ER   Will be in touch with her routine labs and ordered mammo today   She would like a letter so she can use her HSA for massage which is fine   Signed Lamar Blinks, MD  Received her labs 10/27- message to pt  Results for orders placed or performed in visit on 01/16/19  CBC  Result Value Ref Range   WBC 13.8 (H) 4.0 - 10.5 K/uL   RBC 4.46 3.87 - 5.11 Mil/uL   Platelets 231.0 150.0 - 400.0 K/uL   Hemoglobin 13.2 12.0 - 15.0 g/dL   HCT 39.9 36.0 - 46.0 %   MCV 89.4 78.0 - 100.0 fl   MCHC 33.2 30.0 - 36.0 g/dL   RDW 13.2 11.5 - 15.5 %  Comprehensive metabolic panel  Result Value Ref Range   Sodium 139 135 - 145 mEq/L   Potassium 3.9 3.5 - 5.1 mEq/L   Chloride 103 96 - 112 mEq/L   CO2 28 19 - 32 mEq/L   Glucose, Bld 95 70 - 99 mg/dL   BUN 15 6 - 23 mg/dL   Creatinine, Ser 0.69 0.40 - 1.20 mg/dL   Total Bilirubin 0.8 0.2 - 1.2 mg/dL   Alkaline Phosphatase 61 39 - 117 U/L   AST 12 0 - 37 U/L   ALT 7 0 - 35 U/L   Total Protein 7.6 6.0 - 8.3 g/dL   Albumin 4.6 3.5 - 5.2 g/dL   Calcium 9.2 8.4 - 10.5 mg/dL   GFR 93.06 >60.00 mL/min  Hemoglobin A1c  Result Value Ref Range   Hgb A1c MFr Bld 5.3 4.6 - 6.5 %  Lipid panel  Result Value Ref Range   Cholesterol 188 0 - 200 mg/dL   Triglycerides 135.0 0.0 - 149.0 mg/dL   HDL 58.60 >39.00 mg/dL   VLDL 27.0 0.0 - 40.0 mg/dL   LDL Cholesterol 102 (H) 0 - 99 mg/dL   Total CHOL/HDL Ratio 3    NonHDL 128.90   TSH  Result Value Ref  Range   TSH 1.39 0.35 - 4.50 uIU/mL  Uric acid  Result Value Ref Range   Uric Acid, Serum 7.2 (H) 2.4 - 7.0 mg/dL    Thyroid is normal Cholesterol is overall good A1c- average blood sugar- is normal Metabolic profile is normal Blood counts show mild increase in your white cell count- likely due to stress or a mild illness.  We can recheck this in a few months Your uric acid level is high- if you are having issues with gout we might want to start you on allopurinol  I am glad that all checked out ok with your heart.  If you continue to have symptoms I would recommend that you follow-up with your cardiologist for further evaluation  The 10-year ASCVD risk score Mikey Bussing DC Brooke Bonito., et al., 2013) is: 0.5%   Values used to calculate the score:     Age: 58 years     Sex: Female     Is Non-Hispanic African American: No     Diabetic: No     Tobacco smoker: No     Systolic Blood Pressure: 123XX123 mmHg     Is BP treated: No     HDL Cholesterol: 58.6 mg/dL     Total Cholesterol: 188 mg/dL

## 2019-01-16 ENCOUNTER — Ambulatory Visit (HOSPITAL_BASED_OUTPATIENT_CLINIC_OR_DEPARTMENT_OTHER)
Admit: 2019-01-16 | Discharge: 2019-01-16 | Disposition: A | Discharge status: Home / Self Care | Payer: 59 | Attending: Family Medicine | Admitting: Family Medicine

## 2019-01-16 ENCOUNTER — Emergency Department (HOSPITAL_BASED_OUTPATIENT_CLINIC_OR_DEPARTMENT_OTHER): Payer: 59

## 2019-01-16 ENCOUNTER — Encounter (HOSPITAL_BASED_OUTPATIENT_CLINIC_OR_DEPARTMENT_OTHER): Payer: Self-pay

## 2019-01-16 ENCOUNTER — Encounter: Payer: Self-pay | Admitting: Family Medicine

## 2019-01-16 ENCOUNTER — Emergency Department (HOSPITAL_BASED_OUTPATIENT_CLINIC_OR_DEPARTMENT_OTHER)
Admission: EM | Admit: 2019-01-16 | Discharge: 2019-01-16 | Disposition: A | Discharge status: Home / Self Care | Payer: 59 | Attending: Emergency Medicine | Admitting: Emergency Medicine

## 2019-01-16 ENCOUNTER — Other Ambulatory Visit: Payer: Self-pay

## 2019-01-16 ENCOUNTER — Encounter (HOSPITAL_BASED_OUTPATIENT_CLINIC_OR_DEPARTMENT_OTHER): Payer: Self-pay | Admitting: *Deleted

## 2019-01-16 ENCOUNTER — Ambulatory Visit (INDEPENDENT_AMBULATORY_CARE_PROVIDER_SITE_OTHER): Payer: 59 | Admitting: Family Medicine

## 2019-01-16 VITALS — Temp 96.7°F | Resp 16 | Ht 64.0 in | Wt 129.0 lb

## 2019-01-16 DIAGNOSIS — Z131 Encounter for screening for diabetes mellitus: Secondary | ICD-10-CM

## 2019-01-16 DIAGNOSIS — R9431 Abnormal electrocardiogram [ECG] [EKG]: Secondary | ICD-10-CM | POA: Diagnosis not present

## 2019-01-16 DIAGNOSIS — R0789 Other chest pain: Secondary | ICD-10-CM | POA: Insufficient documentation

## 2019-01-16 DIAGNOSIS — M79644 Pain in right finger(s): Secondary | ICD-10-CM

## 2019-01-16 DIAGNOSIS — Z130 Encounter for screening for diseases of the blood and blood-forming organs and certain disorders involving the immune mechanism: Secondary | ICD-10-CM | POA: Diagnosis not present

## 2019-01-16 DIAGNOSIS — R079 Chest pain, unspecified: Secondary | ICD-10-CM | POA: Diagnosis not present

## 2019-01-16 DIAGNOSIS — Z1329 Encounter for screening for other suspected endocrine disorder: Secondary | ICD-10-CM

## 2019-01-16 DIAGNOSIS — Z1231 Encounter for screening mammogram for malignant neoplasm of breast: Secondary | ICD-10-CM | POA: Diagnosis not present

## 2019-01-16 DIAGNOSIS — Z79899 Other long term (current) drug therapy: Secondary | ICD-10-CM | POA: Diagnosis not present

## 2019-01-16 DIAGNOSIS — Z13220 Encounter for screening for lipoid disorders: Secondary | ICD-10-CM | POA: Diagnosis not present

## 2019-01-16 DIAGNOSIS — Z0000 Encounter for general adult medical examination without abnormal findings: Secondary | ICD-10-CM

## 2019-01-16 LAB — CBC
HCT: 39.9 % (ref 36.0–46.0)
HCT: 40 % (ref 36.0–46.0)
Hemoglobin: 13.2 g/dL (ref 12.0–15.0)
Hemoglobin: 13.1 g/dL (ref 12.0–15.0)
MCH: 29.3 pg (ref 26.0–34.0)
MCHC: 33.2 g/dL (ref 30.0–36.0)
MCHC: 32.8 g/dL (ref 30.0–36.0)
MCV: 89.4 fl (ref 78.0–100.0)
MCV: 89.5 fL (ref 80.0–100.0)
Platelets: 231 10*3/uL (ref 150.0–400.0)
Platelets: 235 10*3/uL (ref 150–400)
RBC: 4.46 Mil/uL (ref 3.87–5.11)
RBC: 4.47 MIL/uL (ref 3.87–5.11)
RDW: 13.2 % (ref 11.5–15.5)
RDW: 13 % (ref 11.5–15.5)
WBC: 13.8 10*3/uL — ABNORMAL HIGH (ref 4.0–10.5)
WBC: 13.8 10*3/uL — ABNORMAL HIGH (ref 4.0–10.5)
nRBC: 0 % (ref 0.0–0.2)

## 2019-01-16 LAB — COMPREHENSIVE METABOLIC PANEL
ALT: 7 U/L (ref 0–35)
AST: 12 U/L (ref 0–37)
Albumin: 4.6 g/dL (ref 3.5–5.2)
Alkaline Phosphatase: 61 U/L (ref 39–117)
BUN: 15 mg/dL (ref 6–23)
CO2: 28 mEq/L (ref 19–32)
Calcium: 9.2 mg/dL (ref 8.4–10.5)
Chloride: 103 mEq/L (ref 96–112)
Creatinine, Ser: 0.69 mg/dL (ref 0.40–1.20)
GFR: 93.06 mL/min (ref >60.00)
Glucose, Bld: 95 mg/dL (ref 70–99)
Potassium: 3.9 mEq/L (ref 3.5–5.1)
Sodium: 139 mEq/L (ref 135–145)
Total Bilirubin: 0.8 mg/dL (ref 0.2–1.2)
Total Protein: 7.6 g/dL (ref 6.0–8.3)

## 2019-01-16 LAB — LIPID PANEL
Cholesterol: 188 mg/dL (ref 0–200)
HDL: 58.6 mg/dL (ref >39.00)
LDL Cholesterol: 102 mg/dL — ABNORMAL HIGH (ref 0–99)
NonHDL: 128.9
Total CHOL/HDL Ratio: 3
Triglycerides: 135 mg/dL (ref 0.0–149.0)
VLDL: 27 mg/dL (ref 0.0–40.0)

## 2019-01-16 LAB — PREGNANCY, URINE: Preg Test, Ur: NEGATIVE

## 2019-01-16 LAB — TROPONIN I (HIGH SENSITIVITY)
Troponin I (High Sensitivity): <2 ng/L (ref <18)
Troponin I (High Sensitivity): <2 ng/L (ref <18)

## 2019-01-16 LAB — URIC ACID: Uric Acid, Serum: 7.2 mg/dL — ABNORMAL HIGH (ref 2.4–7.0)

## 2019-01-16 LAB — BASIC METABOLIC PANEL
Anion gap: 10 (ref 5–15)
BUN: 15 mg/dL (ref 6–20)
CO2: 26 mmol/L (ref 22–32)
Calcium: 8.7 mg/dL — ABNORMAL LOW (ref 8.9–10.3)
Chloride: 104 mmol/L (ref 98–111)
Creatinine, Ser: 0.62 mg/dL (ref 0.44–1.00)
GFR calc Af Amer: >60 mL/min (ref >60)
GFR calc non Af Amer: >60 mL/min (ref >60)
Glucose, Bld: 94 mg/dL (ref 70–99)
Potassium: 3.3 mmol/L — ABNORMAL LOW (ref 3.5–5.1)
Sodium: 140 mmol/L (ref 135–145)

## 2019-01-16 LAB — HEMOGLOBIN A1C: Hgb A1c MFr Bld: 5.3 % (ref 4.6–6.5)

## 2019-01-16 LAB — TSH: TSH: 1.39 u[IU]/mL (ref 0.35–4.50)

## 2019-01-16 MED ORDER — POTASSIUM CHLORIDE CRYS ER 20 MEQ PO TBCR
40.0000 meq | EXTENDED_RELEASE_TABLET | Freq: Once | ORAL | Status: AC
  Administered 2019-01-16: 40 meq via ORAL
  Filled 2019-01-16: qty 2

## 2019-01-16 MED ORDER — ASPIRIN 81 MG PO CHEW
324.0000 mg | CHEWABLE_TABLET | Freq: Once | ORAL | Status: AC
  Administered 2019-01-16: 12:00:00 324 mg via ORAL
  Filled 2019-01-16: qty 4

## 2019-01-16 MED ORDER — NITROGLYCERIN 0.4 MG SL SUBL
0.4000 mg | SUBLINGUAL_TABLET | SUBLINGUAL | Status: DC | PRN
  Filled 2019-01-16: qty 1

## 2019-01-16 NOTE — ED Triage Notes (Signed)
Sent by PCP d/t EKG changes and intermittently chest pressure since February.

## 2019-01-16 NOTE — Discharge Instructions (Signed)
Continue taking home medications as prescribed.  Follow-up with your cardiologist or your PCP for reevaluation of symptoms.  Return to the the emergency department if any concerning signs or symptoms develop such as worsening pain, persistent shortness of breath, persistent vomiting, or loss of consciousness.

## 2019-01-16 NOTE — ED Notes (Signed)
Refused to take Nitro.

## 2019-01-16 NOTE — ED Provider Notes (Signed)
[facility] EMERGENCY DEPARTMENT Provider Note   CSN: [id number] Arrival date & time: 01/16/19  [id number]     History   Chief Complaint Chief Complaint  Patient presents with  . EKG changes and chest pressure    HPI [name] is a 43 y.o. female with history of gestational diabetes, migraines, palpitations, anemia presents sent from PCP for evaluation of intermittent chest pain since February.  She reports chest pressure which is left-sided.  She sometimes experiences this with exertion such as heavy lifting or going up stairs but she also experiences it upon awakening frequently.  She woke up around 6:20 AM today with the chest pressure she has been experiencing for the last few months.  It radiates to the back.  It has been intermittent since she woke this morning.  She will sometimes note dyspnea on exertion but otherwise no shortness of breath.  Denies nausea, vomiting, diaphoresis, lightheadedness, or syncope.  She has been experiencing room spinning sensation intermittently for the last several months as well which will typically occur with rapid head movements or looking up to grab something.  She went to her PCP today for scheduled yearly physical/well-visit and mention these symptoms.  Her EKG there was abnormal so she was sent to the ED for further evaluation.  She is a non-smoker, denies recreational drug use or excessive alcohol intake. No family history of first degree relatives with heart disease under the age of 39 but she did have an uncle with early cardiac arrest in his 55s and her grandfather had a heart attack in his 28s.  She has been seen by Dr. Debara Pickett with cardiology previously for palpitations.     The history is provided by the patient.    Past Medical History:  Diagnosis Date  . History of chicken pox   . History of gestational diabetes   . History of measles, mumps, or rubella   . No pertinent past medical history     Patient Active Problem List    Diagnosis Date Noted  . Palpitations 08/03/2016  . Common migraine with intractable migraine 06/19/2016  . Vasovagal near syncope 06/18/2016  . Pre-syncope 06/18/2016  . Dizziness 04/08/2012  . Anemia associated with acute blood loss--due to adhesions and C/S 11/14/2011  . Status post repeat low transverse cesarean section and BTL 11/11/2011  . Menstrual cycle disorder 07/09/2011  . Gestational diabetes mellitus in pregnancy 07/09/2011    Past Surgical History:  Procedure Laterality Date  . BREAST CYST EXCISION  2006  . CESAREAN SECTION    . TUBAL LIGATION       OB History    Gravida  3   Para  3   Term  3   Preterm      AB      Living  3     SAB      TAB      Ectopic      Multiple      Live Births  3            Home Medications    Prior to Admission medications   Medication Sig Start Date End Date Taking? Authorizing Provider  vitamin C (ASCORBIC ACID) 500 MG tablet Take 500 mg by mouth daily.   Yes [provider]  vitamin E 1000 UNIT capsule Take 1,000 Units by mouth daily.   Yes [provider]  ibuprofen (ADVIL) 200 MG tablet Take 200 mg by mouth every 6 (six) hours  as needed.    [provider]  Nutritional Supplements (GRAPESEED EXTRACT PO) Take by mouth daily.    [provider]    Family History Family History  Problem Relation Age of Onset  . Cancer Mother        cervical  . Diabetes Mother   . Hypertension Mother   . Arthritis Other   . Diabetes Other   . Hyperlipidemia Other   . Hypertension Other   . Coronary artery disease Maternal Grandfather   . Asthma Brother   . Cancer Maternal Aunt        colon    Social History Social History   Tobacco Use  . Smoking status: Never Smoker  . Smokeless tobacco: Never Used  Substance Use Topics  . Alcohol use: Yes    Comment: Rarely- red wine  . Drug use: No     Allergies   Patient has no known allergies.   Review of Systems Review of  Systems  Constitutional: Negative for chills and fever.  Respiratory: Positive for shortness of breath.   Cardiovascular: Positive for chest pain.  Gastrointestinal: Negative for abdominal pain, nausea and vomiting.  Neurological: Positive for dizziness. Negative for light-headedness.  All other systems reviewed and are negative.    Physical Exam Updated Vital Signs BP 106/81   Pulse 74   Temp 98.4 F (36.9 C) (Oral)   Resp 20   LMP 12/23/2018 (Within Days)   SpO2 100%   Physical Exam Vitals signs and nursing note reviewed.  Constitutional:      General: She is not in acute distress.    Appearance: She is well-developed.  HENT:     Head: Normocephalic and atraumatic.  Eyes:     General:        Right eye: No discharge.        Left eye: No discharge.     Conjunctiva/sclera: Conjunctivae normal.  Neck:     Musculoskeletal: Normal range of motion and neck supple.     Vascular: No JVD.     Trachea: No tracheal deviation.  Cardiovascular:     Rate and Rhythm: Normal rate and regular rhythm.     Pulses: Normal pulses.     Heart sounds: Normal heart sounds.     Comments: 2+ radial and DP/PT pulses bilaterally, Homans sign absent bilaterally, no lower extremity edema, no palpable cords, compartments are soft  Pulmonary:     Effort: Pulmonary effort is normal.     Breath sounds: Normal breath sounds.  Abdominal:     General: Abdomen is flat. Bowel sounds are normal. There is no distension.     Palpations: Abdomen is soft.     Tenderness: There is no abdominal tenderness. There is no guarding or rebound.  Skin:    General: Skin is warm and dry.     Findings: No erythema.  Neurological:     Mental Status: She is alert.  Psychiatric:        Behavior: Behavior normal.      ED Treatments / Results  Labs (all labs ordered are listed, but only abnormal results are displayed) Labs Reviewed  BASIC METABOLIC PANEL - Abnormal; Notable for the following components:       Result Value   Potassium 3.3 (*)    Calcium 8.7 (*)    All other components within normal limits  CBC - Abnormal; Notable for the following components:   WBC 13.8 (*)    All other components within  normal limits  PREGNANCY, URINE  TROPONIN I (HIGH SENSITIVITY)  TROPONIN I (HIGH SENSITIVITY)    EKG EKG Interpretation  Date/Time:  Monday January 16 2019 10:14:12 EDT Ventricular Rate:  85 PR Interval:    QRS Duration: 104 QT Interval:  397 QTC Calculation: 473 R Axis:   16 Text Interpretation:  Sinus rhythm Abnormal R-wave progression, early transition Nonspecific T abnormalities, anterior leads No significant change since last tracing Confirmed by Isla Pence [phone]) on 01/16/2019 10:17:12 AM   Radiology Dg Chest 2 View  Result Date: 01/16/2019 CLINICAL DATA:  Chest pain EXAM: CHEST - 2 VIEW COMPARISON:  November 08, 2012 FINDINGS: Lungs are clear. Heart size and pulmonary vascularity are normal. No adenopathy. No pneumothorax. No bone lesions. IMPRESSION: No edema or consolidation. Electronically Signed   By: Lowella Grip III M.D.   On: 01/16/2019 10:59    Procedures Procedures (including critical care time)  Medications Ordered in ED Medications  nitroGLYCERIN (NITROSTAT) SL tablet 0.4 mg (has no administration in time range)  aspirin chewable tablet 324 mg (324 mg Oral Given 01/16/19 1208)  potassium chloride SA (KLOR-CON) CR tablet 40 mEq (40 mEq Oral Given 01/16/19 1400)     Initial Impression / Assessment and Plan / ED Course  I have reviewed the triage vital signs and the nursing notes.  Pertinent labs & imaging results that were available during my care of the patient were reviewed by me and considered in my medical decision making (see chart for details).        Patient sent downstairs from PCP for evaluation of chest pain that has been ongoing since February (8 months).  She is afebrile, initially mildly hypertensive with resolution on reevaluation.   She is nontoxic in appearance.  The pain is not pleuritic or reproducible on palpation but at times is exertional.  EKG today shows some T wave inversions in lead V3 but otherwise no significant changes, no acute ischemic abnormalities.  Chest x-ray shows no acute cardiopulmonary abnormalities.  Lab work reviewed by me shows mild nonspecific leukocytosis, no anemia, no metabolic derangements, no renal insufficiency.  She is mildly hypokalemic which was replenished orally in the ED.  Doubt dissection, cardiac tamponade, esophageal rupture, pneumothorax, or pneumonia.  Abdomen soft and nontender.  On reevaluation she is resting comfortably in no apparent distress.  No further emergent work-up required at this time.  She has a HEART score of 2 and is overall low risk for cardiac disease.  Recommend outpatient follow-up with her cardiologist.  Discussed strict ED return precautions. Patient verbalized understanding of and agreement with plan and is safe for discharge home at this time.   Final Clinical Impressions(s) / ED Diagnoses   Final diagnoses:  Chest pressure  Abnormal EKG    ED Discharge Orders    None       Debroah Baller 01/16/19 1434    Isla Pence, MD 01/16/19 1511

## 2019-01-17 ENCOUNTER — Encounter: Payer: Self-pay | Admitting: Family Medicine

## 2019-01-17 ENCOUNTER — Other Ambulatory Visit: Payer: Self-pay | Admitting: Family Medicine

## 2019-01-20 ENCOUNTER — Other Ambulatory Visit: Payer: Self-pay

## 2019-01-20 ENCOUNTER — Ambulatory Visit: Payer: 59 | Admitting: Cardiology

## 2019-01-20 ENCOUNTER — Encounter: Payer: Self-pay | Admitting: Cardiology

## 2019-01-20 VITALS — BP 122/76 | HR 100 | Temp 97.5°F | Ht 63.0 in | Wt 128.0 lb

## 2019-01-20 DIAGNOSIS — Z01812 Encounter for preprocedural laboratory examination: Secondary | ICD-10-CM | POA: Diagnosis not present

## 2019-01-20 DIAGNOSIS — Z7189 Other specified counseling: Secondary | ICD-10-CM

## 2019-01-20 DIAGNOSIS — R072 Precordial pain: Secondary | ICD-10-CM

## 2019-01-20 MED ORDER — METOPROLOL TARTRATE 100 MG PO TABS: ORAL_TABLET | ORAL | 0 refills | Status: DC

## 2019-01-20 MED FILL — METOPROLOL TARTRATE 100 MG: 100 | 1 days supply | Qty: 1 | Fill #0

## 2019-01-20 NOTE — Patient Instructions (Signed)
Medication Instructions:  Your Physician recommend you continue on your current medication as directed.    *If you need a refill on your cardiac medications before your next appointment, please call your pharmacy*  Lab Work: None  Testing/Procedures: Your physician has requested that you have an echocardiogram. Echocardiography is a painless test that uses sound waves to create images of your heart. It provides your doctor with information about the size and shape of your heart and how well your heart's chambers and valves are working. This procedure takes approximately one hour. There are no restrictions for this procedure. [place] [address]  Non-Cardiac CT Angiography (CTA), is a special type of CT scan that uses a computer to produce multi-dimensional views of major blood vessels throughout the body. In CT angiography, a contrast material is injected through an IV to help visualize the blood vessels [facility]  Follow-Up: At [facility], you and your health needs are our priority.  As part of our continuing mission to provide you with exceptional heart care, we have created designated Provider Care Teams.  These Care Teams include your primary Cardiologist (physician) and Advanced Practice Providers (APPs -  Physician Assistants and Nurse Practitioners) who all work together to provide you with the care you need, when you need it.  Your next appointment:   As needed  The format for your next appointment:   Either In Person or Virtual  Provider:   Buford Dresser, MD  Your cardiac CT will be scheduled at one of the below locations:   [facility] [address] [place], [place] [address] [phone]   If scheduled at [facility], please arrive at the [facility] main entrance of [facility] 30-45 minutes prior to test start time. Proceed to the [facility] Radiology Department (first floor) to check-in and test  prep.  If scheduled at [facility], please arrive 15 mins early for check-in and test prep.  Please follow these instructions carefully (unless otherwise directed):  On the Night Before the Test: . Be sure to Drink plenty of water. . Do not consume any caffeinated/decaffeinated beverages or chocolate 12 hours prior to your test. . Do not take any antihistamines 12 hours prior to your test.  On the Day of the Test: . Drink plenty of water. Do not drink any water within one hour of the test. . Do not eat any food 4 hours prior to the test. . You may take your regular medications prior to the test.  . Take metoprolol (Lopressor) two hours prior to test. . FEMALES- please wear underwire-free bra if available       After the Test: . Drink plenty of water. . After receiving IV contrast, you may experience a mild flushed feeling. This is normal. . On occasion, you may experience a mild rash up to 24 hours after the test. This is not dangerous. If this occurs, you can take Benadryl 25 mg and increase your fluid intake. . If you experience trouble breathing, this can be serious. If it is severe call 911 IMMEDIATELY. If it is mild, please call our office. . If you take any of these medications: Glipizide/Metformin, Avandament, Glucavance, please do not take 48 hours after completing test unless otherwise instructed.   Once we have confirmed authorization from your insurance company, we will call you to set up a date and time for your test.   For non-scheduling related questions, please contact  the cardiac imaging nurse navigator should you have any questions/concerns: Marchia Bond, RN Navigator Cardiac Imaging [facility] [phone] Office

## 2019-01-20 NOTE — Progress Notes (Signed)
Cardiology Office Note:    Date:  01/20/2019   ID:  [name], DOB 11/05/1975, MRN [id number]  PCP:  Darreld Mclean, MD  Cardiologist:  Buford Dresser, MD  Referring MD: Darreld Mclean, MD   CC: new patient consult for chest pain  History of Present Illness:    [name] is a 43 y.o. female with a hx of palpitations who is seen as an urgent new patient to me/seen by Dr. Debara Pickett in 2018 at the request of Copland, Gay Filler, MD for the evaluation and management of chest pain. She is a Airline pilot.  She presented to ER 01/16/19 with chest pressure. Notes, workup reviewed. ECG borderline but not significantly changed from prior. hsTn <2 x2.  Chest pain: -Initial onset: February 2020, has been intermittent since then but feels like it is getting worse -Quality: pressure, tightness, radiates to back -Frequency: varies, last few days has been frequent -Duration: few minutes usually  -Associated symptoms: short of breath with it, no nausea/diaphoresis. Does sometimes have dizziness as well. 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  Past Medical History:  Diagnosis Date  . History of chicken pox   . History of gestational diabetes   . History of measles, mumps, or rubella   . No pertinent past medical history     Past Surgical History:  Procedure Laterality Date  . BREAST CYST EXCISION  2006  . CESAREAN SECTION    . TUBAL LIGATION      Current Medications: Current Outpatient Medications on  File Prior to Visit  Medication Sig  . ibuprofen (ADVIL) 200 MG tablet Take 200 mg by mouth every 6 (six) hours as needed.  . Nutritional Supplements (GRAPESEED EXTRACT PO) Take by mouth daily.  . vitamin C (ASCORBIC ACID) 500 MG tablet Take 500 mg by mouth daily.  . vitamin E 1000 UNIT capsule Take 1,000 Units by mouth daily.   No current facility-administered medications on file prior to visit.      Allergies:   Patient has no known allergies.   Social History   Tobacco Use  . Smoking status: Never Smoker  . Smokeless tobacco: Never Used  Substance Use Topics  . Alcohol use: Yes    Comment: Rarely- red wine  . Drug use: No    Family History: family history includes Arthritis in an other family member; Asthma in her brother; Cancer in her maternal aunt and mother; Coronary artery disease in her maternal grandfather; Diabetes in her mother and another family member; Hyperlipidemia in an other family member; Hypertension in her mother and another family member.  ROS:   Please see the history of present illness.  Additional pertinent ROS: Constitutional: Negative for chills, fever, night sweats, unintentional weight loss  HENT: Negative for ear pain and hearing loss.   Eyes: Negative for loss of vision and eye pain.  Respiratory: Negative for cough, sputum, wheezing.   Cardiovascular: See HPI. Gastrointestinal: Negative for abdominal pain, melena, and hematochezia.  Genitourinary: Negative for dysuria and hematuria.  Musculoskeletal: Negative for falls and myalgias.  Skin: Negative  for itching and rash.  Neurological: Negative for focal weakness, focal sensory changes and loss of consciousness.  Endo/Heme/Allergies: Does not bruise/bleed easily.     EKGs/Labs/Other Studies Reviewed:    The following studies were reviewed today: Recent ER evaluation  EKG:  EKG is personally reviewed.  The ekg ordered today demonstrates NSR, RSR'/borderline IVCD  Recent Labs: 01/16/2019:  ALT 7; BUN 15; Creatinine, Ser 0.62; Hemoglobin 13.1; Platelets 235; Potassium 3.3; Sodium 140; TSH 1.39  Recent Lipid Panel    Component Value Date/Time   CHOL 188 01/16/2019 0946   TRIG 135.0 01/16/2019 0946   HDL 58.60 01/16/2019 0946   CHOLHDL 3 01/16/2019 0946   VLDL 27.0 01/16/2019 0946   LDLCALC 102 (H) 01/16/2019 0946    Physical Exam:    VS:  BP 122/76 (BP Location: Left Arm, Patient Position: Sitting, Cuff Size: Normal)   Pulse 100   Temp (!) 97.5 F (36.4 C)   Ht 5\' 3"  (1.6 m)   Wt 128 lb (58.1 kg)   LMP 12/23/2018 (Within Days)   BMI 22.67 kg/m     Wt Readings from Last 3 Encounters:  01/20/19 128 lb (58.1 kg)  01/16/19 129 lb (58.5 kg)  08/04/18 128 lb (58.1 kg)    GEN: Well nourished, well developed in no acute distress HEENT: Normal, moist mucous membranes NECK: No JVD CARDIAC: regular rhythm, normal S1 and S2, no rubs or gallops. No murmurs. VASCULAR: Radial and DP pulses 2+ bilaterally. No carotid bruits RESPIRATORY:  Clear to auscultation without rales, wheezing or rhonchi  ABDOMEN: Soft, non-tender, non-distended MUSCULOSKELETAL:  Ambulates independently SKIN: Warm and dry, no edema NEUROLOGIC:  Alert and oriented x 3. No focal neuro deficits noted. PSYCHIATRIC:  Normal affect    ASSESSMENT:    1. Precordial pain   2. Pre-procedure lab exam   3. Cardiac risk counseling   4. Counseling on health promotion and disease prevention    PLAN:    Chest pain: she is overall low risk, but her symptoms are causing her significant distress and affecting her job. We spent significant time today reviewing different parts of the cardiovascular system (electrical, vascular, functional, and valvular). We discussed how each of these systems can present with different symptoms. We reviewed that there are different ways we evaluate these symptoms with tests. We reviewed which tests I think are most appropriate given the symptoms, and we discussed risks/benefits and  limitations of each of these tests. Please see summary below. We also discussed that if testing is unrevealing for a cardiac cause of the symptoms, there are many noncardiac causes as well that can contribute to symptoms. If the heart is ruled out, then I recommend returning to PCP to discuss alternative diagnoses.  -discussed treadmill stress, nuclear stress/lexiscan, and CT coronary angiography. Discussed pros and cons of each, including but not limited to false positive/false negative risk, radiation risk, and risk of IV contrast dye. Based on shared decision making, decision was made to pursue CT coronary angiography. -will give one time dose of metoprolol -had recent BMET in ER -counseled on use of sublingual nitroglycerin and its importance to a good test  Given the duration/description of her symptoms, will also order echocardiogram to evaluate for pericardial fluid/thickening that may suggest pericarditis.  Cardiac risk counseling and prevention recommendations: -recommend heart healthy/Mediterranean diet, with whole grains, fruits, vegetable, fish, lean meats, nuts, and olive oil. Limit salt. -recommend moderate walking, 3-5 times/week for 30-50 minutes each session. Aim for at least 150  minutes.week. Goal should be pace of 3 miles/hours, or walking 1.5 miles in 30 minutes -recommend avoidance of tobacco products. Avoid excess alcohol. -ASCVD risk score: The 10-year ASCVD risk score Mikey Bussing DC Brooke Bonito., et al., 2013) is: 0.5%   Values used to calculate the score:     Age: 23 years     Sex: Female     Is Non-Hispanic African American: No     Diabetic: No     Tobacco smoker: No     Systolic Blood Pressure: 123XX123 mmHg     Is BP treated: No     HDL Cholesterol: 58.6 mg/dL     Total Cholesterol: 188 mg/dL    Plan for follow up: TBD based on results of testing  Medication Adjustments/Labs and Tests Ordered: Current medicines are reviewed at length with the patient today.  Concerns regarding  medicines are outlined above.  Orders Placed This Encounter  Procedures  . CT CORONARY MORPH W/CTA COR W/SCORE W/CA W/CM &/OR WO/CM  . CT CORONARY FRACTIONAL FLOW RESERVE DATA PREP  . CT CORONARY FRACTIONAL FLOW RESERVE FLUID ANALYSIS  . Basic metabolic panel  . EKG 12-Lead  . ECHOCARDIOGRAM COMPLETE   Meds ordered this encounter  Medications  . metoprolol tartrate (LOPRESSOR) 100 MG tablet    Sig: TAKE 1 TABLET 2 HR PRIOR TO CARDIAC PROCEDURE    Dispense:  1 tablet    Refill:  0    Patient Instructions  Medication Instructions:  Your Physician recommend you continue on your current medication as directed.    *If you need a refill on your cardiac medications before your next appointment, please call your pharmacy*  Lab Work: None  Testing/Procedures: Your physician has requested that you have an echocardiogram. Echocardiography is a painless test that uses sound waves to create images of your heart. It provides your doctor with information about the size and shape of your heart and how well your heart's chambers and valves are working. This procedure takes approximately one hour. There are no restrictions for this procedure. [place] [address]  Non-Cardiac CT Angiography (CTA), is a special type of CT scan that uses a computer to produce multi-dimensional views of major blood vessels throughout the body. In CT angiography, a contrast material is injected through an IV to help visualize the blood vessels [facility]  Follow-Up: At [facility], you and your health needs are our priority.  As part of our continuing mission to provide you with exceptional heart care, we have created designated Provider Care Teams.  These Care Teams include your primary Cardiologist (physician) and Advanced Practice Providers (APPs -  Physician Assistants and Nurse Practitioners) who all work together to provide you with the care you need, when you need it.  Your next  appointment:   As needed  The format for your next appointment:   Either In Person or Virtual  Provider:   Buford Dresser, MD  Your cardiac CT will be scheduled at one of the below locations:   [facility] [address] [place], [place] [address] [phone]   If scheduled at [facility], please arrive at the [facility] main entrance of [facility] 30-45 minutes prior to test start time. Proceed to the [facility] Radiology Department (first floor) to check-in and test prep.  If scheduled at [facility], please arrive 15 mins early for check-in and test prep.  Please follow these instructions carefully (unless otherwise directed):  On the Night Before the Test: . Be sure to Drink plenty of water. . Do not consume any caffeinated/decaffeinated beverages or chocolate 12 hours prior to your test. . Do not take any antihistamines 12 hours prior to your test.  On the Day of the Test: . Drink plenty of water. Do not drink any water within one hour of the test. . Do not eat any food 4 hours prior to the test. . You may take your regular medications prior to the test.  . Take metoprolol (Lopressor) two hours prior to test. . FEMALES- please wear underwire-free bra if available       After the Test: . Drink plenty of water. . After receiving IV contrast, you may experience a mild flushed feeling. This is normal. . On occasion, you may experience a mild rash up to 24 hours after the test. This is not dangerous. If this occurs, you can take Benadryl 25 mg and increase your fluid intake. . If you experience trouble breathing, this can be serious. If it is severe call 911 IMMEDIATELY. If it is mild, please call our office. . If you take any of these medications: Glipizide/Metformin, Avandament, Glucavance, please do not take 48 hours after completing test unless otherwise instructed.   Once we have confirmed  authorization from your insurance company, we will call you to set up a date and time for your test.   For non-scheduling related questions, please contact the cardiac imaging nurse navigator should you have any questions/concerns: Marchia Bond, RN Navigator Cardiac Imaging [facility] [phone] Office       Signed, Buford Dresser, MD PhD 01/20/2019  [place]

## 2019-01-21 ENCOUNTER — Encounter: Payer: Self-pay | Admitting: Cardiology

## 2019-01-24 ENCOUNTER — Ambulatory Visit (HOSPITAL_COMMUNITY): Payer: 59 | Attending: Cardiology

## 2019-01-24 ENCOUNTER — Telehealth: Payer: 59 | Admitting: Adult Health

## 2019-01-24 ENCOUNTER — Other Ambulatory Visit: Payer: Self-pay

## 2019-01-24 DIAGNOSIS — R072 Precordial pain: Secondary | ICD-10-CM | POA: Insufficient documentation

## 2019-02-15 NOTE — Telephone Encounter (Signed)
Message sent to pre-auth

## 2019-02-15 NOTE — Telephone Encounter (Signed)
Returned call to pt she states that she does not need to go to the ER and she is having chest pressure and a squeezing sensation almost every time with exertion with a squeezing sensation in her chest that radiates to her back. She states that Dr Harrell Gave told her that she needs to have the CT and "did not seem concerned" about the chest pressure at her las visit. She states that Dr Harrell Gave states that she thinks that she has pericarditis. She further states that she works nights and sleeps in the afternoon, the pressure and squeezing will wake her about 2pm (in the middle of her sleep hours). She declines to go to the ER at this time she just wants to have the CT scheduled. I informed pt that she still needs to go to be evaluated. She declined again. Transferred to be scheduled for a CT.

## 2019-02-19 NOTE — Progress Notes (Signed)
Cardiology Office Note   Date:  02/20/2019   ID:  [name], DOB 1975-05-23, MRN [id number]  PCP:  Darreld Mclean, MD  Cardiologist:  Dr.Christopher  CC: Follow Up   History of Present Illness: [name] is a 43 y.o. female who presents for ongoing assessment and management of palpitations, chronic chest pressure, with associated dyspnea and dizziness.  She notices this more when she lifts patients gets better with rest.  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   She was seen last by Dr. Harrell Gave on 01/20/2019.  After lengthy discussion of different options for testing, a cardiac CTA was ordered, nitroglycerin sublingual dose was provided.  Also echocardiogram to evaluate for pericardial fluid, thickening, to rule out pericarditis.  She was also advised on a more active lifestyle to include exercising 3 or 30 to 50 minutes 3-5 times a week, with a goal of 150 minutes a week.  Echocardiogram completed on 01/24/2019, revealed an EF of 60% to 65%, there is no left ventricular hypertrophy.  Right ventricle had normal systolic function, right and left atrial size was normal, valvular structure was normal.  Coronary CTA was scheduled for 02/23/2019.  She continues to have symptoms of chest pressure radiating through to her back noticing it more when she wakes up.  She denies any worsening shortness of breath, palpitations, or dizziness.  Her coronary CTA is pending.   Past Medical History:  Diagnosis Date  . History of chicken pox   . History of gestational diabetes   . History of measles, mumps, or rubella   . No pertinent past medical history     Past Surgical History:  Procedure Laterality Date  . BREAST CYST EXCISION  2006  . CESAREAN SECTION    . TUBAL LIGATION       Current Outpatient Medications  Medication Sig Dispense Refill  . ibuprofen (ADVIL) 200 MG tablet Take 200 mg by mouth every 6 (six) hours as needed.    . metoprolol tartrate  (LOPRESSOR) 100 MG tablet TAKE 1 TABLET 2 HR PRIOR TO CARDIAC PROCEDURE 1 tablet 0  . Nutritional Supplements (GRAPESEED EXTRACT PO) Take by mouth daily.    . vitamin C (ASCORBIC ACID) 500 MG tablet Take 500 mg by mouth daily.    . vitamin E 1000 UNIT capsule Take 1,000 Units by mouth daily.     No current facility-administered medications for this visit.     Allergies:   Patient has no known allergies.    Social History:  The patient  reports that she has never smoked. She has never used smokeless tobacco. She reports current alcohol use. She reports that she does not use drugs.   Family History:  The patient's family history includes Arthritis in an other family member; Asthma in her brother; Cancer in her maternal aunt and mother; Coronary artery disease in her maternal grandfather; Diabetes in her mother and another family member; Hyperlipidemia in an other family member; Hypertension in her mother and another family member.    ROS: All other systems are reviewed and negative. Unless otherwise mentioned in H&P    PHYSICAL EXAM: VS:  BP 124/82   Pulse 86   Temp (!) 97.5 F (36.4 C)   Ht 5\' 3"  (1.6 m)   Wt 130 lb 6.4 oz (59.1 kg)   SpO2 99%   BMI 23.10 kg/m  , BMI Body mass index is 23.1 kg/m. GEN: Well nourished, well developed, in  no acute distress HEENT: normal Neck: no JVD, carotid bruits, or masses Cardiac: RRR, Split S2, ; no murmurs, rubs, or gallops,no edema  Respiratory:  Clear to auscultation bilaterally, normal work of breathing GI: soft, nontender, nondistended, + BS MS: no deformity or atrophy Skin: warm and dry, no rash Neuro:  Strength and sensation are intact Psych: euthymic mood, full affect   EKG:  Not completed this visit.    Recent Labs: 01/16/2019: ALT 7; BUN 15; Creatinine, Ser 0.62; Hemoglobin 13.1; Platelets 235; Potassium 3.3; Sodium 140; TSH 1.39    Lipid Panel    Component Value Date/Time   CHOL 188 01/16/2019 0946   TRIG 135.0  01/16/2019 0946   HDL 58.60 01/16/2019 0946   CHOLHDL 3 01/16/2019 0946   VLDL 27.0 01/16/2019 0946   LDLCALC 102 (H) 01/16/2019 0946      Wt Readings from Last 3 Encounters:  02/20/19 130 lb 6.4 oz (59.1 kg)  01/20/19 128 lb (58.1 kg)  01/16/19 129 lb (58.5 kg)      Other studies Reviewed: Echocardiogram 02-06-2019 1. Left ventricular ejection fraction, by visual estimation, is 60 to 65%. The left ventricle has normal function. There is no left ventricular hypertrophy.  2. Global right ventricle has normal systolic function.The right ventricular size is normal. No increase in right ventricular wall thickness.  3. Left atrial size was normal.  4. Right atrial size was normal.  5. The mitral valve is normal in structure. No evidence of mitral valve regurgitation. No evidence of mitral stenosis.  6. The tricuspid valve is normal in structure. Tricuspid valve regurgitation is not demonstrated.  7. The aortic valve is normal in structure. Aortic valve regurgitation is not visualized. No evidence of aortic valve sclerosis or stenosis.  8. The pulmonic valve was normal in structure. Pulmonic valve regurgitation is not visualized.  9. The inferior vena cava is normal in size with greater than 50% respiratory variability, suggesting right atrial pressure of 3 mmHg.  ASSESSMENT AND PLAN:  1.  Chest pressure: Cardiac CTA is planned for 04/25/2018.  Symptoms appear to be more GI/GERD in etiology, however we will have definitive evaluation of her coronary anatomy once cardiac CTA is completed.Repeat  BMET this am as the labs were drawn in Oct for CTA planned earlier.   2.  Possible GERD: I have suggested OTC H2 blocker such as Pepcid for symptomatic relief.  She admits to drinking a lot of caffeine.  Current medicines are reviewed at length with the patient today.    Labs/ tests ordered today include: None   Phill Myron. West Pugh, ANP, AACC   02/20/2019 10:02 AM    [place]  [facility] [address] Office [phone] Fax [phone]  Notice: This dictation was prepared with Dragon dictation along with smaller phrase technology. Any transcriptional errors that result from this process are unintentional and may not be corrected upon review.

## 2019-02-20 ENCOUNTER — Ambulatory Visit (INDEPENDENT_AMBULATORY_CARE_PROVIDER_SITE_OTHER): Payer: 59 | Admitting: Adult Health

## 2019-02-20 ENCOUNTER — Other Ambulatory Visit: Payer: Self-pay

## 2019-02-20 ENCOUNTER — Other Ambulatory Visit: Payer: Self-pay | Admitting: Adult Health

## 2019-02-20 ENCOUNTER — Encounter: Payer: Self-pay | Admitting: Adult Health

## 2019-02-20 VITALS — BP 124/82 | HR 86 | Temp 97.5°F | Ht 63.0 in | Wt 130.4 lb

## 2019-02-20 DIAGNOSIS — Z01812 Encounter for preprocedural laboratory examination: Secondary | ICD-10-CM | POA: Diagnosis not present

## 2019-02-20 LAB — BASIC METABOLIC PANEL WITH GFR
BUN/Creatinine Ratio: 11 (ref 9–23)
BUN: 9 mg/dL (ref 6–24)
CO2: 21 mmol/L (ref 20–29)
Calcium: 8.9 mg/dL (ref 8.7–10.2)
Chloride: 100 mmol/L (ref 96–106)
Creatinine, Ser: 0.82 mg/dL (ref 0.57–1.00)
GFR calc Af Amer: 102 mL/min/1.73
GFR calc non Af Amer: 89 mL/min/1.73
Glucose: 74 mg/dL (ref 65–99)
Potassium: 4.1 mmol/L (ref 3.5–5.2)
Sodium: 137 mmol/L (ref 134–144)

## 2019-02-20 MED ORDER — POTASSIUM CHLORIDE ER 10 MEQ PO TBCR: 10.0000 meq | EXTENDED_RELEASE_TABLET | Freq: Every day | ORAL | 1 refills | Status: DC

## 2019-02-20 NOTE — Patient Instructions (Signed)
Medication Instructions:  Natalie Sims, DNP recommends that you continue on your current medications as directed. Please refer to the Current Medication list given to you today.  *If you need a refill on your cardiac medications before your next appointment, please call your pharmacy*  Testing/Procedures: Keep your scheduled appointment for your Cardiac CT on Thursday, December 3rd at 0900h. Please arrive about 30 minutes before your appointment time.  Follow-Up: At [facility], you and your health needs are our priority.  As part of our continuing mission to provide you with exceptional heart care, we have created designated Provider Care Teams.  These Care Teams include your primary Cardiologist (physician) and Advanced Practice Providers (APPs -  Physician Assistants and Nurse Practitioners) who all work together to provide you with the care you need, when you need it.  Your next appointment:   3 week(s)  The format for your next appointment:   In Person  Provider:   You may see Buford Dresser, MD or one of the following Advanced Practice Providers on your designated Care Team:    Natalie Sims, DNP, ANP

## 2019-02-21 MED FILL — METOPROLOL TARTRATE 100 MG: 100 | 1 days supply | Qty: 1 | Fill #0

## 2019-02-22 ENCOUNTER — Encounter (HOSPITAL_COMMUNITY): Payer: Self-pay

## 2019-02-22 ENCOUNTER — Telehealth (HOSPITAL_COMMUNITY): Payer: Self-pay | Admitting: Emergency Medicine

## 2019-02-22 NOTE — Telephone Encounter (Signed)
Left message on voicemail with name and callback number Donesha Wallander RN Navigator Cardiac Imaging  [facility] [phone] Office [phone] Cell  

## 2019-02-23 ENCOUNTER — Ambulatory Visit
Admission: RE | Admit: 2019-02-23 | Discharge: 2019-02-23 | Disposition: A | Discharge status: Home / Self Care | Payer: 59 | Source: Ambulatory Visit | Attending: Cardiology | Admitting: Cardiology

## 2019-02-23 ENCOUNTER — Other Ambulatory Visit: Payer: Self-pay

## 2019-02-23 DIAGNOSIS — R072 Precordial pain: Secondary | ICD-10-CM | POA: Insufficient documentation

## 2019-02-23 MED ORDER — NITROGLYCERIN 0.4 MG SL SUBL
0.8000 mg | SUBLINGUAL_TABLET | Freq: Once | SUBLINGUAL | Status: AC
  Administered 2019-02-23: 10:00:00 0.8 mg via SUBLINGUAL

## 2019-02-23 MED ORDER — IOHEXOL 350 MG/ML SOLN
75.0000 mL | Freq: Once | INTRAVENOUS | Status: AC | PRN
  Administered 2019-02-23: 09:00:00 75 mL via INTRAVENOUS

## 2019-02-23 MED ORDER — METOPROLOL TARTRATE 5 MG/5ML IV SOLN
5.0000 mg | Freq: Once | INTRAVENOUS | Status: AC
  Administered 2019-02-23: 10:00:00 5 mg via INTRAVENOUS

## 2019-02-23 NOTE — Progress Notes (Signed)
Patient tolerated CT well. Stated developed a headache 8/10 pressure gave soda headache decreased to 4/10 pressure. Alert ambulated to exit steady gait.

## 2019-03-14 ENCOUNTER — Encounter: Payer: Self-pay | Admitting: Cardiology

## 2019-03-14 ENCOUNTER — Ambulatory Visit (INDEPENDENT_AMBULATORY_CARE_PROVIDER_SITE_OTHER): Payer: 59 | Admitting: Cardiology

## 2019-03-14 ENCOUNTER — Other Ambulatory Visit: Payer: Self-pay

## 2019-03-14 VITALS — BP 122/77 | HR 72 | Temp 97.9°F | Ht 63.0 in | Wt 130.2 lb

## 2019-03-14 DIAGNOSIS — R0789 Other chest pain: Secondary | ICD-10-CM

## 2019-03-14 DIAGNOSIS — Z712 Person consulting for explanation of examination or test findings: Secondary | ICD-10-CM

## 2019-03-14 DIAGNOSIS — Z7189 Other specified counseling: Secondary | ICD-10-CM

## 2019-03-14 NOTE — Patient Instructions (Signed)
Medication Instructions:  Your Physician recommend you continue on your current medication as directed.    *If you need a refill on your cardiac medications before your next appointment, please call your pharmacy*  Lab Work: None  Testing/Procedures: None  Follow-Up: At [facility], you and your health needs are our priority.  As part of our continuing mission to provide you with exceptional heart care, we have created designated Provider Care Teams.  These Care Teams include your primary Cardiologist (physician) and Advanced Practice Providers (APPs -  Physician Assistants and Nurse Practitioners) who all work together to provide you with the care you need, when you need it.  Your next appointment:   As needed  The format for your next appointment:   Either In Person or Virtual  Provider:   Buford Dresser, MD

## 2019-03-14 NOTE — Progress Notes (Signed)
Cardiology Office Note:    Date:  03/14/2019   ID:  [name], DOB [birth year], MRN [id number]  PCP:  Darreld Mclean, MD  Cardiologist:  Buford Dresser, MD  Referring MD: Darreld Mclean, MD   CC: follow up  History of Present Illness:    [name] is a 43 y.o. female with a hx of palpitations who is seen for follow up today. She was seen 01/20/19 as an urgent new patient to me/seen by Dr. Debara Pickett in 2018 at the request of Copland, Gay Filler, MD for the evaluation and management of chest pain. She is a Airline pilot.  Cardiac history: She presented to ER 01/16/19 with chest pressure. Notes, workup reviewed. ECG borderline but not significantly changed from prior. hsTn <2 x2. Initial onset of pain February 2020, has been intermittent since then but feels like it is getting worse. Pressure, tightness, radiates to back  Today:  Reviewed results of CT coronary, below. Very reassuring, no coronary calcium, no evidence of CAD. Discussed that this is very suggestive of a low rate of future events in the next 10-5 years and also supports that her pain is not cardiac in origin. Echo done also normal, very reassuring. We reviewed her results at length, including personally reviewing her imaging with her.  She is understandably concerned that if the pain is not related to her heart, there must be something else causing it. We discussed noncardiac chest pain at length today, focusing on nerve/MSK, lung, and GI as common mimickers. She does note that sometimes when she gets the pain, she also gets hives on her sternum. We discussed that the differential is even more broad that the basics we discussed today, but that thus far no high risk findings have been noted.  Denies shortness of breath at rest. No PND, orthopnea, LE edema or unexpected weight gain. No syncope or palpitations.  Past Medical History:  Diagnosis Date  . History of chicken pox   . History of gestational  diabetes   . History of measles, mumps, or rubella   . No pertinent past medical history     Past Surgical History:  Procedure Laterality Date  . BREAST CYST EXCISION  2006  . CESAREAN SECTION    . TUBAL LIGATION      Current Medications: Current Outpatient Medications on File Prior to Visit  Medication Sig  . ibuprofen (ADVIL) 200 MG tablet Take 200 mg by mouth every 6 (six) hours as needed.  . Nutritional Supplements (GRAPESEED EXTRACT PO) Take by mouth daily.  . vitamin C (ASCORBIC ACID) 500 MG tablet Take 500 mg by mouth daily.  . vitamin E 1000 UNIT capsule Take 1,000 Units by mouth daily.   No current facility-administered medications on file prior to visit.     Allergies:   Patient has no known allergies.   Social History   Tobacco Use  . Smoking status: Never Smoker  . Smokeless tobacco: Never Used  Substance Use Topics  . Alcohol use: Yes    Comment: Rarely- red wine  . Drug use: No    Family History: family history includes Arthritis in an other family member; Asthma in her brother; Cancer in her maternal aunt and mother; Coronary artery disease in her maternal grandfather; Diabetes in her mother and another family member; Hyperlipidemia in an other family member; Hypertension in her mother and another family member. mat gpa died from cardiac arrest in his 26s, mat uncle had cardiac arrest  in his 17s.  ROS:   Please see the history of present illness.  Additional pertinent ROS: Constitutional: Negative for chills, fever, night sweats, unintentional weight loss  HENT: Negative for ear pain and hearing loss.   Eyes: Negative for loss of vision and eye pain.  Respiratory: Negative for cough, sputum, wheezing.   Cardiovascular: See HPI. Gastrointestinal: Negative for abdominal pain, melena, and hematochezia.  Genitourinary: Negative for dysuria and hematuria.  Musculoskeletal: Negative for falls and myalgias.  Skin: Negative for itching and rash.  Neurological:  Negative for focal weakness, focal sensory changes and loss of consciousness.  Endo/Heme/Allergies: Does not bruise/bleed easily.    EKGs/Labs/Other Studies Reviewed:    The following studies were reviewed today: Ct coronary 02/23/19 IMPRESSION: 1. Coronary calcium score of 0. This was 0 percentile for age and sex matched control. 2. Normal coronary origin with right dominance. 3. No evidence of CAD. CAD-RADS 0. No evidence of CAD (0%). Consider non-atherosclerotic causes of chest pain.  Echo 01/24/19  1. Left ventricular ejection fraction, by visual estimation, is 60 to 65%. The left ventricle has normal function. There is no left ventricular hypertrophy.  2. Global right ventricle has normal systolic function.The right ventricular size is normal. No increase in right ventricular wall thickness.  3. Left atrial size was normal.  4. Right atrial size was normal.  5. The mitral valve is normal in structure. No evidence of mitral valve regurgitation. No evidence of mitral stenosis.  6. The tricuspid valve is normal in structure. Tricuspid valve regurgitation is not demonstrated.  7. The aortic valve is normal in structure. Aortic valve regurgitation is not visualized. No evidence of aortic valve sclerosis or stenosis.  8. The pulmonic valve was normal in structure. Pulmonic valve regurgitation is not visualized.  9. The inferior vena cava is normal in size with greater than 50% respiratory variability, suggesting right atrial pressure of 3 mmHg.  EKG:  EKG is personally reviewed.  The ekg ordered 01/20/19 demonstrates NSR, RSR'/borderline IVCD  Recent Labs: 01/16/2019: ALT 7; Hemoglobin 13.1; Platelets 235; TSH 1.39 02/20/2019: BUN 9; Creatinine, Ser 0.82; Potassium 4.1; Sodium 137  Recent Lipid Panel    Component Value Date/Time   CHOL 188 01/16/2019 0946   TRIG 135.0 01/16/2019 0946   HDL 58.60 01/16/2019 0946   CHOLHDL 3 01/16/2019 0946   VLDL 27.0 01/16/2019 0946   LDLCALC  102 (H) 01/16/2019 0946    Physical Exam:    VS:  BP 122/77   Pulse 72   Temp 97.9 F (36.6 C)   Ht 5\' 3"  (1.6 m)   Wt 130 lb 3.2 oz (59.1 kg)   SpO2 99%   BMI 23.06 kg/m     Wt Readings from Last 3 Encounters:  03/14/19 130 lb 3.2 oz (59.1 kg)  02/20/19 130 lb 6.4 oz (59.1 kg)  01/20/19 128 lb (58.1 kg)    GEN: Well nourished, well developed in no acute distress HEENT: Normal, moist mucous membranes NECK: No JVD CARDIAC: regular rhythm, normal S1 and S2, no rubs or gallops. No murmur. VASCULAR: Radial and DP pulses 2+ bilaterally. No carotid bruits RESPIRATORY:  Clear to auscultation without rales, wheezing or rhonchi  ABDOMEN: Soft, non-tender, non-distended MUSCULOSKELETAL:  Ambulates independently SKIN: Warm and dry, no edema NEUROLOGIC:  Alert and oriented x 3. No focal neuro deficits noted. PSYCHIATRIC:  Normal affect   ASSESSMENT:    1. Other chest pain   2. Encounter to discuss test results   3. Cardiac  risk counseling   4. Counseling on health promotion and disease prevention    PLAN:    Chest pain -personally reviewed her cardiac test results with her today, including showing her images -discussed the excellent long term prognostic value of a negative calcium score. We reviewed the calcium score at length, including actual images as well as the graph showing mortality based on calcium score. We discussed the pathophysiology of cholesterol plaque formation, the role of calcium and why it is a marker, how plaque is key to acute MI/CVA, and how known plaque is managed with medications.  -We also discussed that since testing is unrevealing for a cardiac cause of the symptoms, there are many noncardiac causes as well that can contribute to symptoms.  I recommend returning to PCP to discuss alternative diagnoses.  Cardiac risk counseling and prevention recommendations: -recommend heart healthy/Mediterranean diet, with whole grains, fruits, vegetable, fish, lean  meats, nuts, and olive oil. Limit salt. -recommend moderate walking, 3-5 times/week for 30-50 minutes each session. Aim for at least 150 minutes.week. Goal should be pace of 3 miles/hours, or walking 1.5 miles in 30 minutes -recommend avoidance of tobacco products. Avoid excess alcohol. -ASCVD risk score: The 10-year ASCVD risk score Mikey Bussing DC Brooke Bonito., et al., 2013) is: 0.5%   Values used to calculate the score:     Age: 6 years     Sex: Female     Is Non-Hispanic African American: No     Diabetic: No     Tobacco smoker: No     Systolic Blood Pressure: 123XX123 mmHg     Is BP treated: No     HDL Cholesterol: 58.6 mg/dL     Total Cholesterol: 188 mg/dL    Plan for follow up: as needed  TIME SPENT WITH PATIENT: >25 minutes of direct patient care. More than 50% of that time was spent on coordination of care and counseling regarding discussion of test results, review of images, and discussion of noncardiac chest pain.  Buford Dresser, MD, PhD [place]  [facility]   Medication Adjustments/Labs and Tests Ordered: Current medicines are reviewed at length with the patient today.  Concerns regarding medicines are outlined above.  No orders of the defined types were placed in this encounter.  No orders of the defined types were placed in this encounter.   Patient Instructions  Medication Instructions:  Your Physician recommend you continue on your current medication as directed.    *If you need a refill on your cardiac medications before your next appointment, please call your pharmacy*  Lab Work: None  Testing/Procedures: None  Follow-Up: At [facility], you and your health needs are our priority.  As part of our continuing mission to provide you with exceptional heart care, we have created designated Provider Care Teams.  These Care Teams include your primary Cardiologist (physician) and Advanced Practice Providers (APPs -  Physician Assistants and Nurse Practitioners)  who all work together to provide you with the care you need, when you need it.  Your next appointment:   As needed  The format for your next appointment:   Either In Person or Virtual  Provider:   Buford Dresser, MD     Signed, Buford Dresser, MD PhD 03/14/2019  [place]

## 2019-04-04 ENCOUNTER — Encounter: Payer: Self-pay | Admitting: Emergency Medicine

## 2019-04-04 ENCOUNTER — Other Ambulatory Visit: Payer: Self-pay

## 2019-04-04 ENCOUNTER — Emergency Department (INDEPENDENT_AMBULATORY_CARE_PROVIDER_SITE_OTHER)
Admission: EM | Admit: 2019-04-04 | Discharge: 2019-04-04 | Disposition: A | Discharge status: Home / Self Care | Payer: 59 | Source: Home / Self Care

## 2019-04-04 DIAGNOSIS — M5412 Radiculopathy, cervical region: Secondary | ICD-10-CM

## 2019-04-04 MED ORDER — PREDNISONE 20 MG PO TABS: ORAL_TABLET | ORAL | 0 refills | Status: DC

## 2019-04-04 MED FILL — predniSONE 20 MG TABS: 20 | 7 days supply | Qty: 11 | Fill #0

## 2019-04-04 NOTE — Discharge Instructions (Signed)
Wear soft cervical collar at night.  May take Tylenol as needed for pain.  Marland Kitchen Apply ice pack for 20 to 30 minutes, 3 to 4 times daily  Continue until pain decreases.

## 2019-04-04 NOTE — ED Provider Notes (Signed)
[facility]    CSN: [id number] Arrival date & time: 04/04/19  1009      History   Chief Complaint Chief Complaint  Patient presents with  . Neck Pain  . Back Pain    HPI [name] is a 44 y.o. female.   Patient works as a Marine scientist and complains of several month history of persistent pain in her upper chest, left neck and left shoulder.  Her symptoms have become worse during the past several days.  She even has pain when working at a computer terminal.  At night her most comfortable position is with extension of her neck.  She visited a cardiologist because of her anterior chest pain, and cardiac etiology has been ruled out. Review of records reveals a C-spine X-ray done 08/04/18 that demonstrates degenerative changes most prominent at C5-6 with mild neural foraminal narrowing bilaterally at this level.   Neck Pain Pain location:  L side and R side Quality:  Aching Pain radiates to:  L shoulder and L arm Pain severity:  Moderate Pain is:  Same all the time Onset quality:  Gradual Timing:  Constant Progression:  Worsening Chronicity:  Chronic Context: not recent injury   Relieved by:  Nothing Worsened by:  Position Ineffective treatments:  NSAIDs Associated symptoms: chest pain   Associated symptoms: no fever, no headaches, no numbness, no tingling and no weakness   Risk factors: no hx of spinal trauma   Back Pain Associated symptoms: chest pain   Associated symptoms: no fever, no headaches, no numbness, no tingling and no weakness     Past Medical History:  Diagnosis Date  . History of chicken pox   . History of gestational diabetes   . History of measles, mumps, or rubella   . No pertinent past medical history     Patient Active Problem List   Diagnosis Date Noted  . Palpitations 08/03/2016  . Common migraine with intractable migraine 06/19/2016  . Vasovagal near syncope 06/18/2016  . Pre-syncope 06/18/2016  . Dizziness 04/08/2012  . Anemia  associated with acute blood loss--due to adhesions and C/S 11/14/2011  . Status post repeat low transverse cesarean section and BTL 11/11/2011  . Menstrual cycle disorder 07/09/2011  . Gestational diabetes mellitus in pregnancy 07/09/2011    Past Surgical History:  Procedure Laterality Date  . BREAST CYST EXCISION  2006  . CESAREAN SECTION    . TUBAL LIGATION      OB History    Gravida  3   Para  3   Term  3   Preterm      AB      Living  3     SAB      TAB      Ectopic      Multiple      Live Births  3            Home Medications    Prior to Admission medications   Medication Sig Start Date End Date Taking? Authorizing Provider  ibuprofen (ADVIL) 200 MG tablet Take 200 mg by mouth every 6 (six) hours as needed.   Yes [provider]  Nutritional Supplements (GRAPESEED EXTRACT PO) Take by mouth daily.    [provider]  predniSONE (DELTASONE) 20 MG tablet Take one tab by mouth twice daily for 4 days, then one daily for 3 days. Take with food. 04/04/19   Kandra Nicolas, MD  vitamin C (ASCORBIC ACID) 500 MG tablet  Take 500 mg by mouth daily.    [provider]  vitamin E 1000 UNIT capsule Take 1,000 Units by mouth daily.    [provider]    Family History Family History  Problem Relation Age of Onset  . Cancer Mother        cervical  . Diabetes Mother   . Hypertension Mother   . Arthritis Other   . Diabetes Other   . Hyperlipidemia Other   . Hypertension Other   . Coronary artery disease Maternal Grandfather   . Asthma Brother   . Cancer Maternal Aunt        colon    Social History Social History   Tobacco Use  . Smoking status: Never Smoker  . Smokeless tobacco: Never Used  Substance Use Topics  . Alcohol use: Yes    Comment: Rarely- red wine  . Drug use: No     Allergies   Patient has no known allergies.   Review of Systems Review of Systems  Constitutional: Negative for fever.    Cardiovascular: Positive for chest pain.  Musculoskeletal: Positive for back pain, neck pain and neck stiffness.  Skin: Negative.   Neurological: Negative for tingling, weakness, numbness and headaches.  All other systems reviewed and are negative.    Physical Exam Triage Vital Signs ED Triage Vitals  Enc Vitals Group     BP 04/04/19 1024 126/86     Pulse Rate 04/04/19 1024 72     Resp 04/04/19 1024 16     Temp 04/04/19 1024 98.3 F (36.8 C)     Temp Source 04/04/19 1024 Oral     SpO2 04/04/19 1024 100 %     Weight --      Height --      Head Circumference --      Peak Flow --      Pain Score 04/04/19 1025 6     Pain Loc --      Pain Edu? --      Excl. in [place]? --    No data found.  Updated Vital Signs BP 126/86   Pulse 72   Temp 98.3 F (36.8 C) (Oral)   Resp 16   LMP 03/21/2019   SpO2 100%   Visual Acuity Right Eye Distance:   Left Eye Distance:   Bilateral Distance:    Right Eye Near:   Left Eye Near:    Bilateral Near:     Physical Exam Vitals and nursing note reviewed.  Constitutional:      General: She is not in acute distress. HENT:     Head: Normocephalic.     Right Ear: External ear normal.     Left Ear: External ear normal.     Nose: Nose normal.  Eyes:     Conjunctiva/sclera: Conjunctivae normal.     Pupils: Pupils are equal, round, and reactive to light.  Neck:      Comments: Mild tenderness to palpation posterior and left neck.  Patient's pain is most pronounced wth rotation of her head to the left and flexion to the left.  Distal neurovascular function is intact.  Cardiovascular:     Rate and Rhythm: Normal rate.  Pulmonary:     Effort: Pulmonary effort is normal.  Musculoskeletal:     Cervical back: Neck supple. Tenderness present. No rigidity or crepitus. Pain with movement and muscular tenderness present. No spinous process tenderness. Decreased range of motion.  Lymphadenopathy:     Cervical:  No cervical adenopathy.  Skin:     General: Skin is warm and dry.     Findings: No rash.  Neurological:     Mental Status: She is alert.      UC Treatments / Results  Labs (all labs ordered are listed, but only abnormal results are displayed) Labs Reviewed - No data to display  EKG   Radiology No results found.  Procedures Procedures (including critical care time)  Medications Ordered in UC Medications - No data to display  Initial Impression / Assessment and Plan / UC Course  I have reviewed the triage vital signs and the nursing notes.  Pertinent labs & imaging results that were available during my care of the patient were reviewed by me and considered in my medical decision making (see chart for details).    Begin prednisone burst/taper.  Dispensed soft cervical collar. Followup with Dr. Aundria Mems ([facility]) for further evaluation/management   Final Clinical Impressions(s) / UC Diagnoses   Final diagnoses:  Cervical radiculopathy at C5  Cervical radiculopathy at C6     Discharge Instructions     Wear soft cervical collar at night.  May take Tylenol as needed for pain.  Marland Kitchen Apply ice pack for 20 to 30 minutes, 3 to 4 times daily  Continue until pain decreases.     ED Prescriptions    Medication Sig Dispense Auth. Provider   predniSONE (DELTASONE) 20 MG tablet Take one tab by mouth twice daily for 4 days, then one daily for 3 days. Take with food. 11 tablet Kandra Nicolas, MD        Kandra Nicolas, MD 04/04/19 1126

## 2019-04-04 NOTE — ED Triage Notes (Signed)
PT works in Physicist, medical and pulls and lifts patients. She has known DDD in her neck. Taking ibuprofen as needed. She saw cardiology recently and was cleared.   Neck pain sometimes radiates down back and into chest.

## 2019-04-05 ENCOUNTER — Ambulatory Visit (INDEPENDENT_AMBULATORY_CARE_PROVIDER_SITE_OTHER): Payer: 59 | Admitting: Sports Medicine

## 2019-04-05 DIAGNOSIS — M5030 Other cervical disc degeneration, unspecified cervical region: Secondary | ICD-10-CM | POA: Insufficient documentation

## 2019-04-05 MED ORDER — PREDNISONE 50 MG PO TABS: ORAL_TABLET | ORAL | 0 refills | Status: DC

## 2019-04-05 MED ORDER — CYCLOBENZAPRINE HCL 10 MG PO TABS: ORAL_TABLET | ORAL | 0 refills | Status: DC

## 2019-04-05 MED FILL — predniSONE 50 MG TABS: 50 | 5 days supply | Qty: 5 | Fill #0

## 2019-04-05 MED FILL — CYCLOBENZAPRINE HCL 10 MG T: 10 | 10 days supply | Qty: 30 | Fill #0

## 2019-04-05 NOTE — Progress Notes (Signed)
    Procedures performed today:    None.  Independent interpretation of tests performed by another provider:   I personally reviewed her x-rays, she has C4-5 degenerative disc disease with anterior osteophytic spurring.  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 Symptoms are very clearly radicular. She has C4-C5 DDD on x-rays. I would like her on light duty. At this point we are going to switch her prednisone to a 50 mg burst, add Flexeril before she goes to bed (she works night shift), and formal physical therapy at [place], I like to see her back in about 4 weeks and we can consider an MRI for interventional planning if no better.    ___________________________________________ Gwen Her. Dianah Field, M.D., ABFM., CAQSM. Primary Care and [place] Instructor of [place] of [facility] of [facility]

## 2019-04-05 NOTE — Assessment & Plan Note (Signed)
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 Symptoms are very clearly radicular. She has C4-C5 DDD on x-rays. I would like her on light duty. At this point we are going to switch her prednisone to a 50 mg burst, add Flexeril before she goes to bed (she works night shift), and formal physical therapy at [place], I like to see her back in about 4 weeks and we can consider an MRI for interventional planning if no better.

## 2019-04-06 NOTE — Telephone Encounter (Signed)
Pt called. She wants you to look out for a fax for her fmla.  Thanks

## 2019-04-07 ENCOUNTER — Telehealth: Payer: Self-pay | Admitting: Sports Medicine

## 2019-04-07 NOTE — Telephone Encounter (Signed)
FMLA paperwork filled out out today

## 2019-04-12 ENCOUNTER — Ambulatory Visit: Payer: 59 | Attending: Sports Medicine | Admitting: Physical Therapy

## 2019-04-12 ENCOUNTER — Other Ambulatory Visit: Payer: Self-pay

## 2019-04-12 ENCOUNTER — Encounter: Payer: Self-pay | Admitting: Physical Therapy

## 2019-04-12 DIAGNOSIS — M62838 Other muscle spasm: Secondary | ICD-10-CM | POA: Diagnosis present

## 2019-04-12 DIAGNOSIS — M25511 Pain in right shoulder: Secondary | ICD-10-CM | POA: Diagnosis present

## 2019-04-12 DIAGNOSIS — M542 Cervicalgia: Secondary | ICD-10-CM | POA: Diagnosis present

## 2019-04-12 DIAGNOSIS — G8929 Other chronic pain: Secondary | ICD-10-CM | POA: Diagnosis present

## 2019-04-12 DIAGNOSIS — R293 Abnormal posture: Secondary | ICD-10-CM | POA: Diagnosis present

## 2019-04-12 NOTE — Patient Instructions (Signed)
Access Code: [facility]  URL: [email or web address]  Date: 04/12/2019  Prepared by: Lum Babe   Exercises Seated Cervical Retraction - 10 reps - 1 sets - 2 hold - 2x daily - 7x weekly Seated Cervical Retraction and Extension - 10 reps - 1 sets - 2 hold - 2x daily - 7x weekly Seated Scapular Retraction - 10 reps - 1 sets - 2 hold - 2x daily - 7x weekly

## 2019-04-12 NOTE — Therapy (Signed)
[place], [place], [address] Phone: [phone]   Fax:  [phone]  Physical Therapy Evaluation  Patient Details  Name: [name] MRN: [id number] Date of Birth: May 06, 1975 Referring Provider (PT): Dr. Darene Lamer   Encounter Date: 04/12/2019  PT End of Session - 04/12/19 0915    Visit Number  1    Date for PT Re-Evaluation  06/10/19    PT Start Time  0845    PT Stop Time  0930    PT Time Calculation (min)  45 min    Activity Tolerance  Patient tolerated treatment well    Behavior During Therapy  [facility] for tasks assessed/performed       Past Medical History:  Diagnosis Date  . History of chicken pox   . History of gestational diabetes   . History of measles, mumps, or rubella   . No pertinent past medical history     Past Surgical History:  Procedure Laterality Date  . BREAST CYST EXCISION  2006  . CESAREAN SECTION    . TUBAL LIGATION      There were no vitals filed for this visit.   Subjective Assessment - 04/12/19 0848    Subjective  Patient reports that last week she was pushing and pulling patients and noticed she started having neck pain.  X-rays show DDD.  Denies numbness and tingling, Does c/o some pressure in the upper chest and upper back, went to cardiologist all negative    Limitations  Lifting;House hold activities    Patient Stated Goals  have less pain, tolerate work and being on the computer    Currently in Pain?  Yes    Pain Score  2     Pain Location  Neck    Pain Descriptors / Indicators  Aching;Throbbing;Pressure    Pain Type  Acute pain    Pain Radiating Towards  some chest and upper back "pressure"    Pain Onset  1 to 4 weeks ago    Pain Frequency  Constant    Aggravating Factors   on computer > 10 minutes, pushing and pulling patients, lifting pain up to 8/10    Pain Relieving Factors  lying down and extending the neck will help the pain pain can be 2/10    Effect of Pain on Daily Activities  difficulty with work, some difficulty sleeping, being on the computer         [facility] PT Assessment - 04/12/19 0001      Assessment   Medical Diagnosis  neck pain    Referring Provider (PT)  Dr. Darene Lamer    Onset Date/Surgical Date  04/03/19    Hand Dominance  Right      Precautions   Precautions  None      Balance Screen   Has the patient fallen in the past 6 months  No    Has the patient had a decrease in activity level because of a fear of falling?   No    Is the patient reluctant to leave their home because of a fear of falling?   No      Home Environment   Additional Comments  does housework, has 3 children under 25, has a family member that she helps and at times has to transfer      Prior Function   Level of Independence  Independent    Vocation  Full time employment    Vocation  Requirements  nurse, transfers patients    Leisure  no exercise      Posture/Postural Control   Posture Comments  fwd head, rounded shoulders      ROM / Strength   AROM / PROM / Strength  AROM;Strength      AROM   Overall AROM Comments  Cervical ROM is decreased 50% for all motions except extension which was WNL's, the motions were painful with flexion, left rotation and left side bending, shoulder motions was WNL's on the left but limited on the right , she reports a frozen shoulder last year.      Strength   Overall Strength Comments  WFL's with some right shoulder pain      Palpation   Palpation comment  she is tight with spasms in the cervical area and the trapezius                Objective measurements completed on examination: See above findings.      [facility] Adult PT Treatment/Exercise - 04/12/19 0001      Modalities   Modalities  Moist Heat;Electrical Stimulation      Moist Heat Therapy   Number Minutes Moist Heat  10 Minutes    Moist Heat Location  Cervical      Electrical Stimulation   Electrical Stimulation Location  cervical     Electrical Stimulation Action  IFC    Electrical Stimulation Parameters  supine    Electrical Stimulation Goals  Pain               PT Short Term Goals - 04/12/19 1002      PT SHORT TERM GOAL #1   Title  independent with initial HEP    Time  2    Period  Weeks    Status  New        PT Long Term Goals - 04/12/19 1002      PT LONG TERM GOAL #1   Title  Improve posture and alignment with patient to demonstrate improve upright posture    Time  8    Period  Weeks    Status  New      PT LONG TERM GOAL #2   Title  increase cervical ROM to WNL's    Time  8    Period  Weeks    Status  New      PT LONG TERM GOAL #3   Title  understand posture and body mechanics for ADL's and work    Time  8    Period  Weeks    Status  New      PT LONG TERM GOAL #4   Title  decrease pain 50%    Time  8    Period  Weeks    Status  New      PT LONG TERM GOAL #5   Title  work without pain    Time  8    Period  Weeks    Status  New             Plan - 04/12/19 [id number]    Clinical Impression Statement  Patient reports that last week she was moving a patient and started having some neck pain, she denies numbness and tingling.  She c/o neck pain , upper back and chest pain, cardiologist ruled out any issues with the heart.  Past x-rays showed DDD of the cervical spine.  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  She has some significant spasms in the neck and upper traps but is not tender    Stability/Clinical Decision Making  Evolving/Moderate complexity    Clinical Decision Making  Low    Rehab Potential  Good    PT Frequency  2x / week    PT Duration  8 weeks    PT Treatment/Interventions  ADLs/Self Care Home Management;Electrical Stimulation;Moist Heat;Traction;Ultrasound;Therapeutic activities;Therapeutic exercise;Manual techniques;Dry  needling;Patient/family education    PT Next Visit Plan  slowly start scapular stabilization, could try traction    Consulted and Agree with Plan of Care  Patient       Patient will benefit from skilled therapeutic intervention in order to improve the following deficits and impairments:  Pain, Improper body mechanics, Increased muscle spasms, Postural dysfunction, Decreased range of motion, Decreased strength  Visit Diagnosis: Cervicalgia - Plan: PT plan of care cert/re-cert  Other muscle spasm - Plan: PT plan of care cert/re-cert     Problem List Patient Active Problem List   Diagnosis Date Noted  . DDD (degenerative disc disease), cervical 04/05/2019  . Palpitations 08/03/2016  . Common migraine with intractable migraine 06/19/2016  . Vasovagal near syncope 06/18/2016  . Pre-syncope 06/18/2016  . Dizziness 04/08/2012  . Anemia associated with acute blood loss--due to adhesions and C/S 11/14/2011  . Status post repeat low transverse cesarean section and BTL 11/11/2011  . Menstrual cycle disorder 07/09/2011  . Gestational diabetes mellitus in pregnancy 07/09/2011    Sumner Boast., PT 04/12/2019, 10:06 AM  [place], [place], [address] Phone: [phone]   Fax:  [phone]  Name: [name] MRN: [id number] Date of Birth: Dec 22, 1975

## 2019-04-13 ENCOUNTER — Encounter: Payer: Self-pay | Admitting: Physical Therapy

## 2019-04-13 ENCOUNTER — Ambulatory Visit: Payer: 59 | Admitting: Physical Therapy

## 2019-04-13 DIAGNOSIS — R293 Abnormal posture: Secondary | ICD-10-CM | POA: Diagnosis not present

## 2019-04-13 DIAGNOSIS — M542 Cervicalgia: Secondary | ICD-10-CM

## 2019-04-13 DIAGNOSIS — G8929 Other chronic pain: Secondary | ICD-10-CM | POA: Diagnosis not present

## 2019-04-13 DIAGNOSIS — M62838 Other muscle spasm: Secondary | ICD-10-CM

## 2019-04-13 DIAGNOSIS — M25511 Pain in right shoulder: Secondary | ICD-10-CM | POA: Diagnosis not present

## 2019-04-13 NOTE — Therapy (Signed)
[place], [place], [address] Phone: [phone]   Fax:  [phone]  Physical Therapy Treatment  Patient Details  Name: [name] MRN: [id number] Date of Birth: [birth year] Referring Provider (PT): Dr. Darene Lamer   Encounter Date: 04/13/2019  PT End of Session - 04/13/19 1512    Visit Number  2    Date for PT Re-Evaluation  06/10/19    PT Start Time  1444    PT Stop Time  1530    PT Time Calculation (min)  46 min    Activity Tolerance  Patient tolerated treatment well    Behavior During Therapy  [facility] for tasks assessed/performed       Past Medical History:  Diagnosis Date  . History of chicken pox   . History of gestational diabetes   . History of measles, mumps, or rubella   . No pertinent past medical history     Past Surgical History:  Procedure Laterality Date  . BREAST CYST EXCISION  2006  . CESAREAN SECTION    . TUBAL LIGATION      There were no vitals filed for this visit.  Subjective Assessment - 04/13/19 1446    Subjective  Patient reports no real changes with the estim    Currently in Pain?  Yes    Pain Score  4     Pain Location  Neck    Pain Orientation  Left                       [facility] Adult PT Treatment/Exercise - 04/13/19 0001      Exercises   Exercises  Neck      Neck Exercises: Machines for Strengthening   UBE (Upper Arm Bike)  level 2 x 4 minutes      Neck Exercises: Theraband   Scapula Retraction  20 reps;Red    Shoulder Extension  20 reps;Red    Shoulder External Rotation  20 reps;Red      Neck Exercises: Standing   Other Standing Exercises  doorway stretch    Other Standing Exercises  wand flexion and extension      Neck Exercises: Supine   Neck Retraction  20 reps;3 secs    Neck Retraction Limitations  head on ball      Modalities   Modalities  Traction      Traction   Type of Traction  Cervical    Min (lbs)  11    Hold  Time  static    Time  10      Manual Therapy   Manual Therapy  Passive ROM;Manual Traction    Passive ROM  gentle shoulder depression, gentle upper trap and levator stretches    Manual Traction  occipital release               PT Short Term Goals - 04/12/19 1002      PT SHORT TERM GOAL #1   Title  independent with initial HEP    Time  2    Period  Weeks    Status  New        PT Long Term Goals - 04/12/19 1002      PT LONG TERM GOAL #1   Title  Improve posture and alignment with patient to demonstrate improve upright posture    Time  8    Period  Weeks    Status  New  PT LONG TERM GOAL #2   Title  increase cervical ROM to WNL's    Time  8    Period  Weeks    Status  New      PT LONG TERM GOAL #3   Title  understand posture and body mechanics for ADL's and work    Time  8    Period  Weeks    Status  New      PT LONG TERM GOAL #4   Title  decrease pain 50%    Time  8    Period  Weeks    Status  New      PT LONG TERM GOAL #5   Title  work without pain    Time  8    Period  Weeks    Status  New            Plan - 04/13/19 1513    Clinical Impression Statement  initiated scapular and cervical stabilization, she did well with this, has some issues with right shoulder ROM, she was tight in the left upper trap and levator, tried traction, no adverse reactions when leaving    PT Next Visit Plan  see how todays treatment went and try to add to the cervical and scapular stabilization, see if traction was beneficial    Consulted and Agree with Plan of Care  Patient       Patient will benefit from skilled therapeutic intervention in order to improve the following deficits and impairments:  Pain, Improper body mechanics, Increased muscle spasms, Postural dysfunction, Decreased range of motion, Decreased strength  Visit Diagnosis: Cervicalgia  Other muscle spasm     Problem List Patient Active Problem List   Diagnosis Date Noted  . DDD  (degenerative disc disease), cervical 04/05/2019  . Palpitations 08/03/2016  . Common migraine with intractable migraine 06/19/2016  . Vasovagal near syncope 06/18/2016  . Pre-syncope 06/18/2016  . Dizziness 04/08/2012  . Anemia associated with acute blood loss--due to adhesions and C/S 11/14/2011  . Status post repeat low transverse cesarean section and BTL 11/11/2011  . Menstrual cycle disorder 07/09/2011  . Gestational diabetes mellitus in pregnancy 07/09/2011    Sumner Boast., PT 04/13/2019, 3:15 PM  [place], [place], [address] Phone: [phone]   Fax:  [phone]  Name: Rhaelyn Hambleton MRN: [id number] Date of Birth: 1975/11/20

## 2019-04-18 ENCOUNTER — Other Ambulatory Visit: Payer: Self-pay

## 2019-04-18 ENCOUNTER — Ambulatory Visit: Payer: 59 | Admitting: Physical Therapy

## 2019-04-18 DIAGNOSIS — M542 Cervicalgia: Secondary | ICD-10-CM

## 2019-04-18 DIAGNOSIS — G8929 Other chronic pain: Secondary | ICD-10-CM | POA: Diagnosis not present

## 2019-04-18 DIAGNOSIS — M62838 Other muscle spasm: Secondary | ICD-10-CM | POA: Diagnosis not present

## 2019-04-18 DIAGNOSIS — M25511 Pain in right shoulder: Secondary | ICD-10-CM | POA: Diagnosis not present

## 2019-04-18 DIAGNOSIS — R293 Abnormal posture: Secondary | ICD-10-CM | POA: Diagnosis not present

## 2019-04-18 NOTE — Therapy (Signed)
[place], [place], [address] Phone: [phone]   Fax:  [phone]  Physical Therapy Treatment  Patient Details  Name: [name] MRN: [id number] Date of Birth: 01/04/1976 Referring Provider (PT): Dr. Darene Lamer   Encounter Date: 04/18/2019  PT End of Session - 04/18/19 1214    Visit Number  3    Date for PT Re-Evaluation  06/10/19    PT Start Time  [id number]    PT Stop Time  1230    PT Time Calculation (min)  45 min       Past Medical History:  Diagnosis Date  . History of chicken pox   . History of gestational diabetes   . History of measles, mumps, or rubella   . No pertinent past medical history     Past Surgical History:  Procedure Laterality Date  . BREAST CYST EXCISION  2006  . CESAREAN SECTION    . TUBAL LIGATION      There were no vitals filed for this visit.  Subjective Assessment - 04/18/19 1147    Subjective  traction helped, stight HA and nausea after but went away    Currently in Pain?  Yes    Pain Score  4     Pain Location  Neck                       [facility] Adult PT Treatment/Exercise - 04/18/19 0001      Neck Exercises: Machines for Strengthening   UBE (Upper Arm Bike)  level 2 x 4 minutes ( 2 fwd/2 back)    Cybex Row  20# 2 sets 10    Cybex Chest Press  10# 2 sets 10    Lat Pull  20# 2 sets 10      Neck Exercises: Theraband   Shoulder External Rotation  20 reps;Red      Neck Exercises: Standing   Neck Retraction  15 reps;3 secs   head on ball on wall   Wall Push Ups  10 reps      Modalities   Modalities  Traction      Traction   Type of Traction  Cervical    Min (lbs)  12    Hold Time  static    Time  15               PT Short Term Goals - 04/12/19 1002      PT SHORT TERM GOAL #1   Title  independent with initial HEP    Time  2    Period  Weeks    Status  New        PT Long Term Goals - 04/12/19 1002      PT LONG  TERM GOAL #1   Title  Improve posture and alignment with patient to demonstrate improve upright posture    Time  8    Period  Weeks    Status  New      PT LONG TERM GOAL #2   Title  increase cervical ROM to WNL's    Time  8    Period  Weeks    Status  New      PT LONG TERM GOAL #3   Title  understand posture and body mechanics for ADL's and work    Time  8    Period  Weeks    Status  New  PT LONG TERM GOAL #4   Title  decrease pain 50%    Time  8    Period  Weeks    Status  New      PT LONG TERM GOAL #5   Title  work without pain    Time  8    Period  Weeks    Status  New            Plan - 04/18/19 1215    Clinical Impression Statement  progressed scap and cerv scap ex for ROM and strength and she tolerated well. increased traction wt and time and did well       Patient will benefit from skilled therapeutic intervention in order to improve the following deficits and impairments:     Visit Diagnosis: Cervicalgia     Problem List Patient Active Problem List   Diagnosis Date Noted  . DDD (degenerative disc disease), cervical 04/05/2019  . Palpitations 08/03/2016  . Common migraine with intractable migraine 06/19/2016  . Vasovagal near syncope 06/18/2016  . Pre-syncope 06/18/2016  . Dizziness 04/08/2012  . Anemia associated with acute blood loss--due to adhesions and C/S 11/14/2011  . Status post repeat low transverse cesarean section and BTL 11/11/2011  . Menstrual cycle disorder 07/09/2011  . Gestational diabetes mellitus in pregnancy 07/09/2011    [name],ANGIE PTA 04/18/2019, 12:30 PM  [place], [place], [address] Phone: [phone]   Fax:  [phone]  Name: [name] MRN: [id number] Date of Birth: 30-Mar-1975

## 2019-04-20 ENCOUNTER — Other Ambulatory Visit: Payer: Self-pay

## 2019-04-20 ENCOUNTER — Ambulatory Visit: Payer: 59 | Admitting: Physical Therapy

## 2019-04-20 ENCOUNTER — Encounter: Payer: Self-pay | Admitting: Physical Therapy

## 2019-04-20 DIAGNOSIS — M542 Cervicalgia: Secondary | ICD-10-CM | POA: Diagnosis not present

## 2019-04-20 DIAGNOSIS — G8929 Other chronic pain: Secondary | ICD-10-CM

## 2019-04-20 DIAGNOSIS — R293 Abnormal posture: Secondary | ICD-10-CM | POA: Diagnosis not present

## 2019-04-20 DIAGNOSIS — M62838 Other muscle spasm: Secondary | ICD-10-CM

## 2019-04-20 DIAGNOSIS — M25511 Pain in right shoulder: Secondary | ICD-10-CM | POA: Diagnosis not present

## 2019-04-20 NOTE — Therapy (Signed)
[facility] [address] [place], [place], [address] Phone: [phone]   Fax:  [phone]  Physical Therapy Treatment  Patient Details  Name: [name] MRN: [id number] Date of Birth: 05/03/1975 Referring Provider (PT): Dr. T   Encounter Date: 04/20/2019  PT End of Session - 04/20/19 0911    Visit Number  4    Date for PT Re-Evaluation  06/10/19    PT Start Time  0843    PT Stop Time  0923    PT Time Calculation (min)  40 min    Activity Tolerance  Patient tolerated treatment well    Behavior During Therapy  [facility] for tasks assessed/performed       Past Medical History:  Diagnosis Date  . History of chicken pox   . History of gestational diabetes   . History of measles, mumps, or rubella   . No pertinent past medical history     Past Surgical History:  Procedure Laterality Date  . BREAST CYST EXCISION  2006  . CESAREAN SECTION    . TUBAL LIGATION      There were no vitals filed for this visit.  Subjective Assessment - 04/20/19 0844    Subjective  Pt reports that she is feeling fine today    Currently in Pain?  Yes    Pain Score  2     Pain Location  Neck                       [facility] Adult PT Treatment/Exercise - 04/20/19 0001      Neck Exercises: Machines for Strengthening   UBE (Upper Arm Bike)  level 2 x 5 minutes ( 2.5 fwd/2.5 back)    Cybex Row  20# 2 sets 10    Cybex Chest Press  10# 2 sets 10    Lat Pull  20# 2 sets 10      Neck Exercises: Theraband   Shoulder External Rotation  20 reps;Red      Neck Exercises: Standing   Neck Retraction  3 secs;20 reps    Wall Push Ups  20 reps      Traction   Type of Traction  Cervical    Min (lbs)  12    Hold Time  static     Time  12               PT Short Term Goals - 04/20/19 0912      PT SHORT TERM GOAL #1   Title  independent with initial HEP    Status  Achieved        PT Long Term Goals - 04/20/19  0912      PT LONG TERM GOAL #1   Title  Improve posture and alignment with patient to demonstrate improve upright posture      PT LONG TERM GOAL #3   Title  understand posture and body mechanics for ADL's and work    Status  Partially Met      PT LONG TERM GOAL #4   Title  decrease pain 50%    Status  Partially Met            Plan - 04/20/19 0912    Clinical Impression Statement  Pt reports improvement overall so interventions kept the same from last session. Some anterior R shoulder pain reported with external rotation but pt reports that it is from work. Added   shoulder flex and abd without issue.    Stability/Clinical Decision Making  Evolving/Moderate complexity    Rehab Potential  Good    PT Frequency  2x / week    PT Duration  8 weeks    PT Treatment/Interventions  ADLs/Self Care Home Management;Electrical Stimulation;Moist Heat;Traction;Ultrasound;Therapeutic activities;Therapeutic exercise;Manual techniques;Dry needling;Patient/family education    PT Next Visit Plan  progress with cervical and scapular stabilization as tolerated, continue traction if beneficial       Patient will benefit from skilled therapeutic intervention in order to improve the following deficits and impairments:  Pain, Improper body mechanics, Increased muscle spasms, Postural dysfunction, Decreased range of motion, Decreased strength  Visit Diagnosis: Cervicalgia  Chronic right shoulder pain  Abnormal posture  Other muscle spasm     Problem List Patient Active Problem List   Diagnosis Date Noted  . DDD (degenerative disc disease), cervical 04/05/2019  . Palpitations 08/03/2016  . Common migraine with intractable migraine 06/19/2016  . Vasovagal near syncope 06/18/2016  . Pre-syncope 06/18/2016  . Dizziness 04/08/2012  . Anemia associated with acute blood loss--due to adhesions and C/S 11/14/2011  . Status post repeat low transverse cesarean section and BTL 11/11/2011  . Menstrual  cycle disorder 07/09/2011  . Gestational diabetes mellitus in pregnancy 07/09/2011    Ronald G Pemberton, PTA 04/20/2019, 9:15 AM  [facility] [address] [place], [place], [address] Phone: [phone]   Fax:  [phone]  Name: [name] MRN: [id number] Date of Birth: 01/14/1976   

## 2019-04-24 ENCOUNTER — Encounter: Payer: Self-pay | Admitting: Physical Therapy

## 2019-04-24 ENCOUNTER — Ambulatory Visit: Payer: 59 | Attending: Sports Medicine | Admitting: Physical Therapy

## 2019-04-24 ENCOUNTER — Other Ambulatory Visit: Payer: Self-pay

## 2019-04-24 DIAGNOSIS — M542 Cervicalgia: Secondary | ICD-10-CM | POA: Insufficient documentation

## 2019-04-24 DIAGNOSIS — M25511 Pain in right shoulder: Secondary | ICD-10-CM | POA: Diagnosis present

## 2019-04-24 DIAGNOSIS — R293 Abnormal posture: Secondary | ICD-10-CM | POA: Diagnosis present

## 2019-04-24 DIAGNOSIS — M62838 Other muscle spasm: Secondary | ICD-10-CM | POA: Insufficient documentation

## 2019-04-24 DIAGNOSIS — G8929 Other chronic pain: Secondary | ICD-10-CM | POA: Diagnosis present

## 2019-04-24 NOTE — Therapy (Signed)
[place], [place], [address] Phone: [phone]   Fax:  [phone]  Physical Therapy Treatment  Patient Details  Name: [name] MRN: [id number] Date of Birth: 1975/09/21 Referring Provider (PT): Dr. Darene Lamer   Encounter Date: 04/24/2019  PT End of Session - 04/24/19 1008    Visit Number  5    Date for PT Re-Evaluation  06/10/19    PT Start Time  0930    PT Stop Time  1023    PT Time Calculation (min)  53 min    Activity Tolerance  Patient tolerated treatment well    Behavior During Therapy  [facility] for tasks assessed/performed       Past Medical History:  Diagnosis Date  . History of chicken pox   . History of gestational diabetes   . History of measles, mumps, or rubella   . No pertinent past medical history     Past Surgical History:  Procedure Laterality Date  . BREAST CYST EXCISION  2006  . CESAREAN SECTION    . TUBAL LIGATION      There were no vitals filed for this visit.  Subjective Assessment - 04/24/19 0934    Subjective  "Pain right now is probably a 2"    Currently in Pain?  Yes    Pain Score  2     Pain Location  Neck                       [facility] Adult PT Treatment/Exercise - 04/24/19 0001      Neck Exercises: Machines for Strengthening   UBE (Upper Arm Bike)  level 2 x 5 minutes ( 3 fwd/3 back)    Cybex Row  25# 2 sets 10    Cybex Chest Press  10# 2 sets 10    Lat Pull  25# 2 sets 10    Other Machines for Strengthening  Shoulder Ext 5lb 2x10       Neck Exercises: Theraband   Shoulder External Rotation  20 reps;Red      Neck Exercises: Standing   Neck Retraction  20 reps;3 secs    Wall Push Ups  20 reps    Other Standing Exercises  Horiz abd red 2x10       Traction   Type of Traction  Cervical    Min (lbs)  12    Hold Time  static     Time  12               PT Short Term Goals - 04/20/19 0912      PT SHORT TERM GOAL #1   Title  independent with initial HEP    Status  Achieved        PT Long Term Goals - 04/24/19 1009      PT LONG TERM GOAL #1   Title  Improve posture and alignment with patient to demonstrate improve upright posture    Status  Partially Met      PT LONG TERM GOAL #2   Title  increase cervical ROM to WNL's    Status  On-going      PT LONG TERM GOAL #3   Title  understand posture and body mechanics for ADL's and work    Status  Partially Met      PT LONG TERM GOAL #4   Title  decrease pain 50%    Status  Achieved            Plan - 04/24/19 1009    Clinical Impression Statement  Pt has progressed towards goals. She reports that she has returned to work under light duty. Good ROM with cervical retraction. She was able to tolerated machine level shoulder extension without issue. Increase resistance tolerated with seated rows and lats.    Stability/Clinical Decision Making  Evolving/Moderate complexity    Rehab Potential  Good    PT Frequency  2x / week    PT Duration  8 weeks    PT Treatment/Interventions  ADLs/Self Care Home Management;Electrical Stimulation;Moist Heat;Traction;Ultrasound;Therapeutic activities;Therapeutic exercise;Manual techniques;Dry needling;Patient/family education    PT Next Visit Plan  progress with cervical and scapular stabilization as tolerated, continue traction if beneficial       Patient will benefit from skilled therapeutic intervention in order to improve the following deficits and impairments:  Pain, Improper body mechanics, Increased muscle spasms, Postural dysfunction, Decreased range of motion, Decreased strength  Visit Diagnosis: Cervicalgia  Chronic right shoulder pain  Abnormal posture     Problem List Patient Active Problem List   Diagnosis Date Noted  . DDD (degenerative disc disease), cervical 04/05/2019  . Palpitations 08/03/2016  . Common migraine with intractable migraine 06/19/2016  . Vasovagal near syncope  06/18/2016  . Pre-syncope 06/18/2016  . Dizziness 04/08/2012  . Anemia associated with acute blood loss--due to adhesions and C/S 11/14/2011  . Status post repeat low transverse cesarean section and BTL 11/11/2011  . Menstrual cycle disorder 07/09/2011  . Gestational diabetes mellitus in pregnancy 07/09/2011    Scot Jun, PTA 04/24/2019, 10:11 AM  [place], [place], [address] Phone: [phone]   Fax:  [phone]  Name: Jonia Oakey MRN: [id number] Date of Birth: 23-Feb-1976

## 2019-04-27 ENCOUNTER — Other Ambulatory Visit: Payer: Self-pay

## 2019-04-27 ENCOUNTER — Encounter: Payer: Self-pay | Admitting: Physical Therapy

## 2019-04-27 ENCOUNTER — Ambulatory Visit: Payer: 59 | Admitting: Physical Therapy

## 2019-04-27 DIAGNOSIS — R293 Abnormal posture: Secondary | ICD-10-CM

## 2019-04-27 DIAGNOSIS — M62838 Other muscle spasm: Secondary | ICD-10-CM | POA: Diagnosis not present

## 2019-04-27 DIAGNOSIS — G8929 Other chronic pain: Secondary | ICD-10-CM

## 2019-04-27 DIAGNOSIS — M542 Cervicalgia: Secondary | ICD-10-CM

## 2019-04-27 DIAGNOSIS — M25511 Pain in right shoulder: Secondary | ICD-10-CM | POA: Diagnosis not present

## 2019-04-27 NOTE — Therapy (Signed)
[place], [place], [address] Phone: [phone]   Fax:  [phone]  Physical Therapy Treatment  Patient Details  Name: [name] MRN: [id number] Date of Birth: [birth year] Referring Provider (PT): Dr. Darene Lamer   Encounter Date: 04/27/2019  PT End of Session - 04/27/19 0924    Visit Number  6    Date for PT Re-Evaluation  06/10/19    PT Start Time  0845    PT Stop Time  0936    PT Time Calculation (min)  51 min    Activity Tolerance  Patient tolerated treatment well    Behavior During Therapy  [facility] for tasks assessed/performed       Past Medical History:  Diagnosis Date  . History of chicken pox   . History of gestational diabetes   . History of measles, mumps, or rubella   . No pertinent past medical history     Past Surgical History:  Procedure Laterality Date  . BREAST CYST EXCISION  2006  . CESAREAN SECTION    . TUBAL LIGATION      There were no vitals filed for this visit.  Subjective Assessment - 04/27/19 0847    Subjective  Pt reports that she is better overall but is still having 2/10 pain. Pt also reports that's her arms are stronger    Limitations  Lifting;House hold activities    Currently in Pain?  Yes    Pain Score  2     Pain Location  Neck                       [facility] Adult PT Treatment/Exercise - 04/27/19 0001      Neck Exercises: Machines for Strengthening   UBE (Upper Arm Bike)  level 2 x 6 minutes ( 3 fwd/3 back)    Cybex Row  25# 2 sets 10    Cybex Chest Press  10# 2 sets 10    Lat Pull  25# 2 sets 10      Neck Exercises: Standing   Neck Retraction  20 reps;3 secs    Other Standing Exercises  3lb shoulder flex & abd 3lb 2x10,     Other Standing Exercises  Shrug 5lb 2x10       Moist Heat Therapy   Number Minutes Moist Heat  10 Minutes    Moist Heat Location  Cervical               PT Short Term Goals - 04/20/19 0912      PT  SHORT TERM GOAL #1   Title  independent with initial HEP    Status  Achieved        PT Long Term Goals - 04/24/19 1009      PT LONG TERM GOAL #1   Title  Improve posture and alignment with patient to demonstrate improve upright posture    Status  Partially Met      PT LONG TERM GOAL #2   Title  increase cervical ROM to WNL's    Status  On-going      PT LONG TERM GOAL #3   Title  understand posture and body mechanics for ADL's and work    Status  Partially Met      PT LONG TERM GOAL #4   Title  decrease pain 50%    Status  Achieved  Plan - 04/27/19 0926    Clinical Impression Statement  Pt reports a tolerable 2/ 10 neck pain. Added shrugs, shoulder flex, and abduction to today's treatment. No reports of increase pain. She did however report some difficulty with RUE due to a past injury. All other interventions tolerated well. Modality for pain and muscle relaxation    Stability/Clinical Decision Making  Evolving/Moderate complexity    PT Frequency  2x / week    PT Duration  8 weeks    PT Treatment/Interventions  ADLs/Self Care Home Management;Electrical Stimulation;Moist Heat;Traction;Ultrasound;Therapeutic activities;Therapeutic exercise;Manual techniques;Dry needling;Patient/family education    PT Next Visit Plan  progress with cervical and scapular stabilization as tolerated, continue traction if beneficial       Patient will benefit from skilled therapeutic intervention in order to improve the following deficits and impairments:  Pain, Improper body mechanics, Increased muscle spasms, Postural dysfunction, Decreased range of motion, Decreased strength  Visit Diagnosis: Chronic right shoulder pain  Cervicalgia  Abnormal posture     Problem List Patient Active Problem List   Diagnosis Date Noted  . DDD (degenerative disc disease), cervical 04/05/2019  . Palpitations 08/03/2016  . Common migraine with intractable migraine 06/19/2016  . Vasovagal  near syncope 06/18/2016  . Pre-syncope 06/18/2016  . Dizziness 04/08/2012  . Anemia associated with acute blood loss--due to adhesions and C/S 11/14/2011  . Status post repeat low transverse cesarean section and BTL 11/11/2011  . Menstrual cycle disorder 07/09/2011  . Gestational diabetes mellitus in pregnancy 07/09/2011    Scot Jun, PTA 04/27/2019, 9:28 AM  [place], [place], [address] Phone: [phone]   Fax:  [phone]  Name: Britanee Vanblarcom MRN: [id number] Date of Birth: [birth year]

## 2019-05-01 ENCOUNTER — Ambulatory Visit: Payer: 59 | Admitting: Physical Therapy

## 2019-05-01 ENCOUNTER — Other Ambulatory Visit: Payer: Self-pay

## 2019-05-01 ENCOUNTER — Encounter: Payer: Self-pay | Admitting: Physical Therapy

## 2019-05-01 DIAGNOSIS — M542 Cervicalgia: Secondary | ICD-10-CM

## 2019-05-01 DIAGNOSIS — M25511 Pain in right shoulder: Secondary | ICD-10-CM | POA: Diagnosis not present

## 2019-05-01 DIAGNOSIS — M62838 Other muscle spasm: Secondary | ICD-10-CM | POA: Diagnosis not present

## 2019-05-01 DIAGNOSIS — R293 Abnormal posture: Secondary | ICD-10-CM | POA: Diagnosis not present

## 2019-05-01 DIAGNOSIS — G8929 Other chronic pain: Secondary | ICD-10-CM | POA: Diagnosis not present

## 2019-05-01 NOTE — Therapy (Signed)
[place], [place], [address] Phone: [phone]   Fax:  [phone]  Physical Therapy Treatment  Patient Details  Name: [name] MRN: [id number] Date of Birth: 1976/02/13 Referring Provider (PT): Dr. Darene Lamer   Encounter Date: 05/01/2019  PT End of Session - 05/01/19 0920    Visit Number  7    Date for PT Re-Evaluation  06/10/19    PT Start Time  0843    PT Stop Time  0930    PT Time Calculation (min)  47 min    Activity Tolerance  Patient tolerated treatment well    Behavior During Therapy  [facility] for tasks assessed/performed       Past Medical History:  Diagnosis Date  . History of chicken pox   . History of gestational diabetes   . History of measles, mumps, or rubella   . No pertinent past medical history     Past Surgical History:  Procedure Laterality Date  . BREAST CYST EXCISION  2006  . CESAREAN SECTION    . TUBAL LIGATION      There were no vitals filed for this visit.  Subjective Assessment - 05/01/19 0844    Subjective  "About the same" Better overall reports pressure in the cervical spine.    Currently in Pain?  Yes    Pain Score  2     Pain Location  Neck    Pain Descriptors / Indicators  Pressure                       [facility] Adult PT Treatment/Exercise - 05/01/19 0001      Neck Exercises: Machines for Strengthening   UBE (Upper Arm Bike)  level 2 x 6 minutes ( 3 fwd/3 back)    Cybex Row  25lb 2x15     Cybex Chest Press  15# 2 sets 10    Lat Pull  25# 2 sets 10    Other Machines for Strengthening  Shoulder Ext 5lb 2x10       Neck Exercises: Standing   Other Standing Exercises  ER red 15 x 2     Other Standing Exercises  Shrug 5lb 2x10       Moist Heat Therapy   Number Minutes Moist Heat  10 Minutes      Manual Therapy   Manual Therapy  Passive ROM;Manual Traction;Soft tissue mobilization    Manual therapy comments  Cervical paraspinales     Passive ROM  gentle shoulder depression with cervical sidebend, Cervical PROM in all directions    Manual Traction  occipital release, Manual cervical traction 7x 15 seconds               PT Short Term Goals - 04/20/19 0912      PT SHORT TERM GOAL #1   Title  independent with initial HEP    Status  Achieved        PT Long Term Goals - 04/24/19 1009      PT LONG TERM GOAL #1   Title  Improve posture and alignment with patient to demonstrate improve upright posture    Status  Partially Met      PT LONG TERM GOAL #2   Title  increase cervical ROM to WNL's    Status  On-going      PT LONG TERM GOAL #3   Title  understand posture and body mechanics for  ADL's and work    Status  Partially Met      PT LONG TERM GOAL #4   Title  decrease pain 50%    Status  Achieved            Plan - 05/01/19 0921    Clinical Impression Statement  Pt appears to have reached a therapeutics plateau. She report improvement overall but continues to reports pressure in the cervical spine that she rated 2/10. She does well with all exercise interventions denoting good strength and ROM. Performed MT to see if that helped. Tissue felt elastic and pliable.    Stability/Clinical Decision Making  Evolving/Moderate complexity    Rehab Potential  Good    PT Frequency  2x / week    PT Treatment/Interventions  ADLs/Self Care Home Management;Electrical Stimulation;Moist Heat;Traction;Ultrasound;Therapeutic activities;Therapeutic exercise;Manual techniques;Dry needling;Patient/family education    PT Next Visit Plan  Pt return to MD on Thursday.       Patient will benefit from skilled therapeutic intervention in order to improve the following deficits and impairments:  Pain, Improper body mechanics, Increased muscle spasms, Postural dysfunction, Decreased range of motion, Decreased strength  Visit Diagnosis: Chronic right shoulder pain  Cervicalgia  Abnormal posture     Problem  List Patient Active Problem List   Diagnosis Date Noted  . DDD (degenerative disc disease), cervical 04/05/2019  . Palpitations 08/03/2016  . Common migraine with intractable migraine 06/19/2016  . Vasovagal near syncope 06/18/2016  . Pre-syncope 06/18/2016  . Dizziness 04/08/2012  . Anemia associated with acute blood loss--due to adhesions and C/S 11/14/2011  . Status post repeat low transverse cesarean section and BTL 11/11/2011  . Menstrual cycle disorder 07/09/2011  . Gestational diabetes mellitus in pregnancy 07/09/2011    Scot Jun, PTA 05/01/2019, 9:23 AM  [place], [place], [address] Phone: [phone]   Fax:  [phone]  Name: [name] MRN: [id number] Date of Birth: 1975/05/05

## 2019-05-03 ENCOUNTER — Ambulatory Visit: Payer: 59 | Admitting: Physical Therapy

## 2019-05-04 ENCOUNTER — Encounter: Payer: Self-pay | Admitting: Sports Medicine

## 2019-05-04 ENCOUNTER — Ambulatory Visit (INDEPENDENT_AMBULATORY_CARE_PROVIDER_SITE_OTHER): Payer: 59 | Admitting: Sports Medicine

## 2019-05-04 ENCOUNTER — Other Ambulatory Visit: Payer: Self-pay

## 2019-05-04 DIAGNOSIS — M5030 Other cervical disc degeneration, unspecified cervical region: Secondary | ICD-10-CM | POA: Diagnosis not present

## 2019-05-04 MED ORDER — MELOXICAM 15 MG PO TABS: ORAL_TABLET | ORAL | 3 refills | Status: DC

## 2019-05-04 MED FILL — MELOXICAM 15 MG TABLET: 15 | 30 days supply | Qty: 30 | Fill #0

## 2019-05-04 NOTE — Progress Notes (Signed)
    Procedures performed today:    None.  Independent interpretation of tests performed by another provider:   None.  Impression and Recommendations:    DDD (degenerative disc disease), cervical This is a very pleasant 44 year old female, we are treating her for cervical DDD. C4-C5 on x-rays. She has at this point had greater than 6 weeks of conservative measures including steroids and formal physical therapy. We have also filled out her FMLA. She is back to full duty but is careful with lifting. She continues to have pain in spite of Flexeril, as well as the prednisone. Switching from ibuprofen to meloxicam, we are going to proceed with an MRI as well for epidural planning. Return to see me to go over MRI results and discuss epidural.    ___________________________________________ Gwen Her. Dianah Field, M.D., ABFM., CAQSM. Primary Care and [place] [employer] [facility]

## 2019-05-04 NOTE — Assessment & Plan Note (Signed)
This is a very pleasant 44 year old female, we are treating her for cervical DDD. C4-C5 on x-rays. She has at this point had greater than 6 weeks of conservative measures including steroids and formal physical therapy. We have also filled out her FMLA. She is back to full duty but is careful with lifting. She continues to have pain in spite of Flexeril, as well as the prednisone. Switching from ibuprofen to meloxicam, we are going to proceed with an MRI as well for epidural planning. Return to see me to go over MRI results and discuss epidural.

## 2019-05-07 ENCOUNTER — Other Ambulatory Visit: Payer: Self-pay

## 2019-05-07 ENCOUNTER — Ambulatory Visit (INDEPENDENT_AMBULATORY_CARE_PROVIDER_SITE_OTHER): Payer: 59

## 2019-05-07 DIAGNOSIS — M5030 Other cervical disc degeneration, unspecified cervical region: Secondary | ICD-10-CM | POA: Diagnosis not present

## 2019-05-07 DIAGNOSIS — M542 Cervicalgia: Secondary | ICD-10-CM | POA: Diagnosis not present

## 2019-05-08 ENCOUNTER — Ambulatory Visit: Payer: 59 | Admitting: Physical Therapy

## 2019-05-08 ENCOUNTER — Encounter: Payer: Self-pay | Admitting: Physical Therapy

## 2019-05-08 DIAGNOSIS — G8929 Other chronic pain: Secondary | ICD-10-CM | POA: Diagnosis not present

## 2019-05-08 DIAGNOSIS — M62838 Other muscle spasm: Secondary | ICD-10-CM | POA: Diagnosis not present

## 2019-05-08 DIAGNOSIS — M542 Cervicalgia: Secondary | ICD-10-CM | POA: Diagnosis not present

## 2019-05-08 DIAGNOSIS — R293 Abnormal posture: Secondary | ICD-10-CM | POA: Diagnosis not present

## 2019-05-08 DIAGNOSIS — M25511 Pain in right shoulder: Secondary | ICD-10-CM | POA: Diagnosis not present

## 2019-05-08 NOTE — Therapy (Signed)
[place], [place], [address] Phone: [phone]   Fax:  [phone]  Physical Therapy Treatment  Patient Details  Name: [name] MRN: [id number] Date of Birth: [birth year] Referring Provider (PT): Dr. Darene Lamer   Encounter Date: 05/08/2019  PT End of Session - 05/08/19 0928    Visit Number  8    Date for PT Re-Evaluation  06/10/19    PT Start Time  0845    PT Stop Time  0932    PT Time Calculation (min)  47 min    Activity Tolerance  Patient tolerated treatment well    Behavior During Therapy  [facility] for tasks assessed/performed       Past Medical History:  Diagnosis Date  . History of chicken pox   . History of gestational diabetes   . History of measles, mumps, or rubella   . No pertinent past medical history     Past Surgical History:  Procedure Laterality Date  . BREAST CYST EXCISION  2006  . CESAREAN SECTION    . TUBAL LIGATION      There were no vitals filed for this visit.  Subjective Assessment - 05/08/19 0849    Subjective  Patient saw MD last week, had MRI yesterday, felt that the last treatment helped some    Currently in Pain?  Yes    Pain Score  2     Pain Location  Neck    Pain Orientation  Left    Pain Descriptors / Indicators  Pressure    Pain Relieving Factors  the last treatment                       [facility] Adult PT Treatment/Exercise - 05/08/19 0001      Neck Exercises: Machines for Strengthening   UBE (Upper Arm Bike)  level 2 x 6 minutes ( 3 fwd/3 back)  (Pended)     Cybex Row  25lb 2x15   (Pended)     Lat Pull  25# 2 sets 10  (Pended)     Other Machines for Strengthening  shrugs with 5# light upper trap and levator stretches  (Pended)       Neck Exercises: Seated   Other Seated Exercise  5# bent over row, 3# extension and  horizontal abduction  (Pended)       Manual Therapy   Manual Therapy  Passive ROM;Manual Traction;Soft tissue  mobilization  (Pended)     Soft tissue mobilization  left upper trap and neck  (Pended)     Passive ROM  gentle cervical PROM with some contract relax and overpressure  (Pended)     Manual Traction  occipital release, Manual cervical traction 7x 15 seconds  (Pended)                PT Short Term Goals - 04/20/19 0912      PT SHORT TERM GOAL #1   Title  independent with initial HEP    Status  Achieved        PT Long Term Goals - 04/24/19 1009      PT LONG TERM GOAL #1   Title  Improve posture and alignment with patient to demonstrate improve upright posture    Status  Partially Met      PT LONG TERM GOAL #2   Title  increase cervical ROM to WNL's    Status  On-going  PT LONG TERM GOAL #3   Title  understand posture and body mechanics for ADL's and work    Status  Partially Met      PT LONG TERM GOAL #4   Title  decrease pain 50%    Status  Achieved            Plan - 05/08/19 0929    Clinical Impression Statement  Patient asw MD, had MRI yesterday.  Felt like the last treatment helped, she is very tight in the upper traps and the cervical mms, I tried some contract relax at end range for cervical spine    PT Next Visit Plan  see what MRI says and MD plan, otherwise continue if we are helping    Consulted and Agree with Plan of Care  Patient       Patient will benefit from skilled therapeutic intervention in order to improve the following deficits and impairments:  Pain, Improper body mechanics, Increased muscle spasms, Postural dysfunction, Decreased range of motion, Decreased strength  Visit Diagnosis: Chronic right shoulder pain  Cervicalgia  Abnormal posture  Other muscle spasm     Problem List Patient Active Problem List   Diagnosis Date Noted  . DDD (degenerative disc disease), cervical 04/05/2019  . Palpitations 08/03/2016  . Common migraine with intractable migraine 06/19/2016  . Vasovagal near syncope 06/18/2016  . Pre-syncope  06/18/2016  . Dizziness 04/08/2012  . Anemia associated with acute blood loss--due to adhesions and C/S 11/14/2011  . Status post repeat low transverse cesarean section and BTL 11/11/2011  . Menstrual cycle disorder 07/09/2011  . Gestational diabetes mellitus in pregnancy 07/09/2011    Sumner Boast., PT 05/08/2019, 9:31 AM  [place], [place], [address] Phone: [phone]   Fax:  [phone]  Name: [name] MRN: [id number] Date of Birth: 1975/04/09

## 2019-05-09 DIAGNOSIS — M5030 Other cervical disc degeneration, unspecified cervical region: Secondary | ICD-10-CM

## 2019-05-10 ENCOUNTER — Encounter: Payer: Self-pay | Admitting: Physical Therapy

## 2019-05-10 ENCOUNTER — Other Ambulatory Visit: Payer: Self-pay

## 2019-05-10 ENCOUNTER — Ambulatory Visit: Payer: 59 | Admitting: Physical Therapy

## 2019-05-10 DIAGNOSIS — M62838 Other muscle spasm: Secondary | ICD-10-CM | POA: Diagnosis not present

## 2019-05-10 DIAGNOSIS — R293 Abnormal posture: Secondary | ICD-10-CM

## 2019-05-10 DIAGNOSIS — M25511 Pain in right shoulder: Secondary | ICD-10-CM | POA: Diagnosis not present

## 2019-05-10 DIAGNOSIS — G8929 Other chronic pain: Secondary | ICD-10-CM

## 2019-05-10 DIAGNOSIS — M542 Cervicalgia: Secondary | ICD-10-CM

## 2019-05-10 NOTE — Therapy (Signed)
[place], [place], [address] Phone: [phone]   Fax:  [phone]  Physical Therapy Treatment  Patient Details  Name: [name] MRN: [id number] Date of Birth: 1975/07/28 Referring Provider (PT): Dr. Darene Lamer   Encounter Date: 05/10/2019  PT End of Session - 05/10/19 0924    Date for PT Re-Evaluation  06/10/19    PT Start Time  0845    PT Stop Time  0935    PT Time Calculation (min)  50 min    Activity Tolerance  Patient tolerated treatment well    Behavior During Therapy  [facility] for tasks assessed/performed       Past Medical History:  Diagnosis Date  . History of chicken pox   . History of gestational diabetes   . History of measles, mumps, or rubella   . No pertinent past medical history     Past Surgical History:  Procedure Laterality Date  . BREAST CYST EXCISION  2006  . CESAREAN SECTION    . TUBAL LIGATION      There were no vitals filed for this visit.  Subjective Assessment - 05/10/19 0847    Subjective  "Just the same" Pain is the same but bearable    Currently in Pain?  Yes    Pain Score  2     Pain Location  Neck                       [facility] Adult PT Treatment/Exercise - 05/10/19 0001      Neck Exercises: Machines for Strengthening   UBE (Upper Arm Bike)  level 2 x 6 minutes ( 3 fwd/3 back)    Cybex Row  35lb 2x10    Cybex Chest Press  20lb 2x10     Lat Pull  35lb 2x10    Other Machines for Strengthening  shrugs with 5# light upper trap and levator stretches      Neck Exercises: Seated   Other Seated Exercise  5# bent over row, 3# extension and  horizontal abduction      Moist Heat Therapy   Number Minutes Moist Heat  11 Minutes    Moist Heat Location  Cervical      Manual Therapy   Manual Therapy  Passive ROM;Manual Traction;Soft tissue mobilization    Soft tissue mobilization  left upper trap and neck    Passive ROM  gentle cervical PROM  with some contract relax and overpressure    Manual Traction  occipital release, Manual cervical traction 7x 15 seconds               PT Short Term Goals - 04/20/19 0912      PT SHORT TERM GOAL #1   Title  independent with initial HEP    Status  Achieved        PT Long Term Goals - 04/24/19 1009      PT LONG TERM GOAL #1   Title  Improve posture and alignment with patient to demonstrate improve upright posture    Status  Partially Met      PT LONG TERM GOAL #2   Title  increase cervical ROM to WNL's    Status  On-going      PT LONG TERM GOAL #3   Title  understand posture and body mechanics for ADL's and work    Status  Partially Met      PT  LONG TERM GOAL #4   Title  decrease pain 50%    Status  Achieved            Plan - 05/10/19 0925    Clinical Impression Statement  Pt will have a epidural injection to hel pwith her neck pain. She wishes to conitnues therapy to work on her  posture and to gain functional strength. Some pain todays with levater stretching after sets of shrugs.    Stability/Clinical Decision Making  Evolving/Moderate complexity    Rehab Potential  Good    PT Frequency  2x / week    PT Treatment/Interventions  ADLs/Self Care Home Management;Electrical Stimulation;Moist Heat;Traction;Ultrasound;Therapeutic activities;Therapeutic exercise;Manual techniques;Dry needling;Patient/family education    PT Next Visit Plan  Will have epidural injection, postural strengthening       Patient will benefit from skilled therapeutic intervention in order to improve the following deficits and impairments:  Pain, Improper body mechanics, Increased muscle spasms, Postural dysfunction, Decreased range of motion, Decreased strength  Visit Diagnosis: Cervicalgia  Abnormal posture  Chronic right shoulder pain     Problem List Patient Active Problem List   Diagnosis Date Noted  . DDD (degenerative disc disease), cervical 04/05/2019  . Palpitations  08/03/2016  . Common migraine with intractable migraine 06/19/2016  . Vasovagal near syncope 06/18/2016  . Pre-syncope 06/18/2016  . Dizziness 04/08/2012  . Anemia associated with acute blood loss--due to adhesions and C/S 11/14/2011  . Status post repeat low transverse cesarean section and BTL 11/11/2011  . Menstrual cycle disorder 07/09/2011  . Gestational diabetes mellitus in pregnancy 07/09/2011    Scot Jun, PTA 05/10/2019, 9:28 AM  [place], [place], [address] Phone: [phone]   Fax:  [phone]  Name: [name] MRN: [id number] Date of Birth: [birth year]

## 2019-05-11 NOTE — Telephone Encounter (Signed)
LMOM with Roberta at [place].

## 2019-05-11 NOTE — Telephone Encounter (Signed)
Please call [facility] to schedule the epidural

## 2019-05-16 ENCOUNTER — Other Ambulatory Visit: Payer: Self-pay

## 2019-05-16 ENCOUNTER — Encounter: Payer: Self-pay | Admitting: Physical Therapy

## 2019-05-16 ENCOUNTER — Ambulatory Visit: Payer: 59 | Admitting: Physical Therapy

## 2019-05-16 DIAGNOSIS — M542 Cervicalgia: Secondary | ICD-10-CM

## 2019-05-16 DIAGNOSIS — R293 Abnormal posture: Secondary | ICD-10-CM

## 2019-05-16 DIAGNOSIS — M62838 Other muscle spasm: Secondary | ICD-10-CM | POA: Diagnosis not present

## 2019-05-16 DIAGNOSIS — G8929 Other chronic pain: Secondary | ICD-10-CM

## 2019-05-16 DIAGNOSIS — M25511 Pain in right shoulder: Secondary | ICD-10-CM | POA: Diagnosis not present

## 2019-05-16 NOTE — Therapy (Signed)
[place], [place], [address] Phone: [phone]   Fax:  [phone]  Physical Therapy Treatment  Patient Details  Name: [name] MRN: [id number] Date of Birth: Dec 11, 1975 Referring Provider (PT): Dr. Darene Lamer   Encounter Date: 05/16/2019  PT End of Session - 05/16/19 0922    Visit Number  9    Date for PT Re-Evaluation  06/10/19    PT Start Time  0845    PT Stop Time  0930    PT Time Calculation (min)  45 min    Activity Tolerance  Patient tolerated treatment well    Behavior During Therapy  [facility] for tasks assessed/performed       Past Medical History:  Diagnosis Date  . History of chicken pox   . History of gestational diabetes   . History of measles, mumps, or rubella   . No pertinent past medical history     Past Surgical History:  Procedure Laterality Date  . BREAST CYST EXCISION  2006  . CESAREAN SECTION    . TUBAL LIGATION      There were no vitals filed for this visit.  Subjective Assessment - 05/16/19 0842    Subjective  Pain is still there, has injection scheduled for Friday    Currently in Pain?  Yes    Pain Score  4     Pain Location  Neck                       [facility] Adult PT Treatment/Exercise - 05/16/19 0001      Neck Exercises: Machines for Strengthening   UBE (Upper Arm Bike)  level 2 x 6 minutes ( 3 fwd/3 back)    Cybex Row  35lb 2x10    Cybex Chest Press  20lb 2x10     Lat Pull  35lb 2x10    Other Machines for Strengthening  shrugs with 5# light upper trap and levator stretches      Neck Exercises: Standing   Other Standing Exercises  ER red 15 x 2; Triceps ext 25lb 2x10     Other Standing Exercises  Horiz abd red 2x10; Shoulder Flex & abe 3lb 2x10       Moist Heat Therapy   Number Minutes Moist Heat  12 Minutes    Moist Heat Location  Cervical               PT Short Term Goals - 04/20/19 0912      PT SHORT TERM GOAL #1   Title  independent with initial HEP    Status  Achieved        PT Long Term Goals - 05/16/19 9892      PT LONG TERM GOAL #1   Title  Improve posture and alignment with patient to demonstrate improve upright posture    Status  Partially Met      PT LONG TERM GOAL #2   Title  increase cervical ROM to WNL's    Status  Partially Met      PT LONG TERM GOAL #3   Title  understand posture and body mechanics for ADL's and work    Status  Achieved      PT LONG TERM GOAL #4   Title  decrease pain 50%            Plan - 05/16/19 0923    Clinical Impression Statement  Neck  pain remains, she is scheduled for an injection on Thursday. She reports some clicking in her neck with cervical retractions. L shoulder popping noted with abduction. Good strength and ROM with all machine level interventions.    Stability/Clinical Decision Making  Evolving/Moderate complexity    Rehab Potential  Good    PT Frequency  2x / week    PT Duration  8 weeks    PT Treatment/Interventions  ADLs/Self Care Home Management;Electrical Stimulation;Moist Heat;Traction;Ultrasound;Therapeutic activities;Therapeutic exercise;Manual techniques;Dry needling;Patient/family education    PT Next Visit Plan  epidural injection Thursday       Patient will benefit from skilled therapeutic intervention in order to improve the following deficits and impairments:  Pain, Improper body mechanics, Increased muscle spasms, Postural dysfunction, Decreased range of motion, Decreased strength  Visit Diagnosis: Abnormal posture  Cervicalgia  Chronic right shoulder pain     Problem List Patient Active Problem List   Diagnosis Date Noted  . DDD (degenerative disc disease), cervical 04/05/2019  . Palpitations 08/03/2016  . Common migraine with intractable migraine 06/19/2016  . Vasovagal near syncope 06/18/2016  . Pre-syncope 06/18/2016  . Dizziness 04/08/2012  . Anemia associated with acute blood loss--due to adhesions  and C/S 11/14/2011  . Status post repeat low transverse cesarean section and BTL 11/11/2011  . Menstrual cycle disorder 07/09/2011  . Gestational diabetes mellitus in pregnancy 07/09/2011    Scot Jun, PTA 05/16/2019, 9:25 AM  [place], [place], [address] Phone: [phone]   Fax:  [phone]  Name: Olayinka Gathers MRN: [id number] Date of Birth: October 13, 1975

## 2019-05-18 ENCOUNTER — Other Ambulatory Visit: Payer: Self-pay

## 2019-05-18 ENCOUNTER — Ambulatory Visit: Payer: 59 | Admitting: Physical Therapy

## 2019-05-18 ENCOUNTER — Ambulatory Visit
Admission: RE | Admit: 2019-05-18 | Discharge: 2019-05-18 | Disposition: A | Discharge status: Home / Self Care | Payer: 59 | Source: Ambulatory Visit | Attending: Sports Medicine | Admitting: Sports Medicine

## 2019-05-18 DIAGNOSIS — M50223 Other cervical disc displacement at C6-C7 level: Secondary | ICD-10-CM | POA: Diagnosis not present

## 2019-05-18 DIAGNOSIS — M5030 Other cervical disc degeneration, unspecified cervical region: Secondary | ICD-10-CM

## 2019-05-18 MED ORDER — TRIAMCINOLONE ACETONIDE 40 MG/ML IJ SUSP (RADIOLOGY)
60.0000 mg | Freq: Once | INTRAMUSCULAR | Status: AC
  Administered 2019-05-18: 09:00:00 60 mg via EPIDURAL

## 2019-05-18 MED ORDER — IOPAMIDOL (ISOVUE-M 300) INJECTION 61%
1.0000 mL | Freq: Once | INTRAMUSCULAR | Status: AC
  Administered 2019-05-18: 09:00:00 1 mL via EPIDURAL

## 2019-05-18 NOTE — Discharge Instructions (Signed)

## 2019-05-23 ENCOUNTER — Encounter: Payer: Self-pay | Admitting: Physical Therapy

## 2019-05-23 ENCOUNTER — Other Ambulatory Visit: Payer: Self-pay

## 2019-05-23 ENCOUNTER — Ambulatory Visit: Payer: 59 | Attending: Sports Medicine | Admitting: Physical Therapy

## 2019-05-23 DIAGNOSIS — M542 Cervicalgia: Secondary | ICD-10-CM | POA: Insufficient documentation

## 2019-05-23 DIAGNOSIS — R293 Abnormal posture: Secondary | ICD-10-CM | POA: Insufficient documentation

## 2019-05-23 DIAGNOSIS — G8929 Other chronic pain: Secondary | ICD-10-CM | POA: Insufficient documentation

## 2019-05-23 DIAGNOSIS — M62838 Other muscle spasm: Secondary | ICD-10-CM | POA: Insufficient documentation

## 2019-05-23 DIAGNOSIS — M25511 Pain in right shoulder: Secondary | ICD-10-CM | POA: Diagnosis present

## 2019-05-23 NOTE — Therapy (Signed)
[place], [place], [address] Phone: [phone]   Fax:  [phone]  Physical Therapy Treatment  Patient Details  Name: [name] MRN: [id number] Date of Birth: 1975/11/04 Referring Provider (PT): Dr. Darene Lamer   Encounter Date: 05/23/2019  PT End of Session - 05/23/19 1017    Visit Number  10    Date for PT Re-Evaluation  06/10/19    PT Start Time  0844    PT Stop Time  0930    PT Time Calculation (min)  46 min    Activity Tolerance  Patient tolerated treatment well    Behavior During Therapy  [facility] for tasks assessed/performed       Past Medical History:  Diagnosis Date  . History of chicken pox   . History of gestational diabetes   . History of measles, mumps, or rubella   . No pertinent past medical history     Past Surgical History:  Procedure Laterality Date  . BREAST CYST EXCISION  2006  . CESAREAN SECTION    . TUBAL LIGATION      There were no vitals filed for this visit.  Subjective Assessment - 05/23/19 0848    Subjective  Had a epidural injection in the neck last Thursday, she reports that she is a little  better but may go back to full duty next week    Currently in Pain?  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, radio of transfer, bed mobility and focus on posture and body mechanics      Neck Exercises: Machines for Strengthening   UBE (Upper Arm Bike)  level 4 x 6 minutes ( 3 fwd/3 back)    Other Machines for Strengthening  shrugs with 5# light upper trap and levator stretches      Neck Exercises: Standing   Other Standing Exercises  ER red 15 x 2; Triceps ext  25lb 2x10     Other Standing Exercises  ball vs wall      Neck Exercises: Seated   Other Seated Exercise  5# bent over row, 3# extension and  horizontal abduction               PT Short Term Goals - 04/20/19 0912      PT SHORT TERM GOAL #1   Title  independent with initial HEP    Status  Achieved        PT Long Term Goals - 05/16/19 1700      PT LONG TERM GOAL #1   Title  Improve posture and alignment with patient to demonstrate improve upright posture    Status  Partially Met      PT LONG TERM GOAL #2   Title  increase cervical ROM to WNL's    Status  Partially Met      PT LONG TERM GOAL #3   Title  understand posture and body mechanics for ADL's and work  Status  Achieved      PT LONG TERM GOAL #4   Title  decrease pain 50%            Plan - 05/23/19 1017    Clinical Impression Statement  Patient reports doing well after the injection, she has some fear about returning to work, she reports that she will have to apply pressure on femur for 15 minutes at times and lift and transfer patiens, we went over the body mechanics for this to decrease stress on the shoulders and the enck    PT Next Visit Plan  assure her posture and body mechanis for her job takss    Consulted and Agree with Plan of Care  Patient       Patient will benefit from skilled therapeutic intervention in order to improve the following deficits and impairments:  Pain, Improper body mechanics, Increased muscle spasms, Postural dysfunction, Decreased range of motion, Decreased strength  Visit Diagnosis: Abnormal posture  Cervicalgia  Chronic right shoulder pain  Other muscle spasm     Problem List Patient Active Problem List   Diagnosis Date Noted  . DDD (degenerative disc disease), cervical 04/05/2019  . Palpitations 08/03/2016  . Common migraine with intractable migraine 06/19/2016  . Vasovagal near syncope 06/18/2016  . Pre-syncope 06/18/2016  . Dizziness 04/08/2012  .  Anemia associated with acute blood loss--due to adhesions and C/S 11/14/2011  . Status post repeat low transverse cesarean section and BTL 11/11/2011  . Menstrual cycle disorder 07/09/2011  . Gestational diabetes mellitus in pregnancy 07/09/2011    Sumner Boast., PT 05/23/2019, 10:21 AM  [place], [place], [address] Phone: [phone]   Fax:  [phone]  Name: [name] MRN: [id number] Date of Birth: [birth year]

## 2019-05-25 ENCOUNTER — Other Ambulatory Visit: Payer: Self-pay

## 2019-05-25 ENCOUNTER — Encounter: Payer: Self-pay | Admitting: Physical Therapy

## 2019-05-25 ENCOUNTER — Ambulatory Visit: Payer: 59 | Admitting: Physical Therapy

## 2019-05-25 DIAGNOSIS — R293 Abnormal posture: Secondary | ICD-10-CM | POA: Diagnosis not present

## 2019-05-25 DIAGNOSIS — M62838 Other muscle spasm: Secondary | ICD-10-CM | POA: Diagnosis not present

## 2019-05-25 DIAGNOSIS — M25511 Pain in right shoulder: Secondary | ICD-10-CM

## 2019-05-25 DIAGNOSIS — M542 Cervicalgia: Secondary | ICD-10-CM | POA: Diagnosis not present

## 2019-05-25 DIAGNOSIS — G8929 Other chronic pain: Secondary | ICD-10-CM | POA: Diagnosis not present

## 2019-05-25 NOTE — Therapy (Signed)
[place], [place], [address] Phone: [phone]   Fax:  [phone]  Physical Therapy Treatment  Patient Details  Name: [name] MRN: [id number] Date of Birth: 09/29/1975 Referring Provider (PT): Dr. Darene Lamer   Encounter Date: 05/25/2019  PT End of Session - 05/25/19 0923    Visit Number  11    Date for PT Re-Evaluation  06/10/19    PT Start Time  0842    PT Stop Time  0925    PT Time Calculation (min)  43 min    Activity Tolerance  Patient tolerated treatment well    Behavior During Therapy  [facility] for tasks assessed/performed       Past Medical History:  Diagnosis Date  . History of chicken pox   . History of gestational diabetes   . History of measles, mumps, or rubella   . No pertinent past medical history     Past Surgical History:  Procedure Laterality Date  . BREAST CYST EXCISION  2006  . CESAREAN SECTION    . TUBAL LIGATION      There were no vitals filed for this visit.  Subjective Assessment - 05/25/19 0844    Subjective  "About the same, it did help some"    Currently in Pain?  Yes    Pain Score  2     Pain Location  Neck                       [facility] Adult PT Treatment/Exercise - 05/25/19 0001      Neck Exercises: Machines for Strengthening   UBE (Upper Arm Bike)  level 4 x 6 minutes ( 3 fwd/3 back)    Nustep  L4 x 4 min     Cybex Row  35lb 2x10    Cybex Chest Press  20lb 2x10     Lat Pull  35lb 2x10      Neck Exercises: Standing   Other Standing Exercises  ER red 15 x 2; Triceps ext 25lb 2x10, RDL for posture 3lb dumbbells      Other Standing Exercises  Shoulder Ext 10lb 2x10       Neck Exercises: Seated   Other Seated Exercise  S2S yello wball with chest press 2x10     Other Seated Exercise  5# bent over row, 3# extension and  horizontal abduction               PT Short Term Goals - 04/20/19 0912      PT SHORT TERM GOAL #1   Title   independent with initial HEP    Status  Achieved        PT Long Term Goals - 05/25/19 [id number]      PT LONG TERM GOAL #1   Title  Improve posture and alignment with patient to demonstrate improve upright posture            Plan - 05/25/19 0924    Clinical Impression Statement  Pt reports that she felt good maintain good body mechanics at work. Tolerable neck pain that she rates 2/10 throughout session and at the end of session. Focused on postural strength and body mechanics. Reports a pull on the R HS with RDLs while maintain neutral spine, soft knees did help with pain. Some fatigue noted with S2S. Core weakness was noted with shoulder extensions.    Stability/Clinical Decision Making  Evolving/Moderate complexity  Rehab Potential  Good    PT Frequency  2x / week    PT Duration  8 weeks    PT Treatment/Interventions  ADLs/Self Care Home Management;Electrical Stimulation;Moist Heat;Traction;Ultrasound;Therapeutic activities;Therapeutic exercise;Manual techniques;Dry needling;Patient/family education    PT Next Visit Plan  assure her posture and body mechanis for her job, Postural strength       Patient will benefit from skilled therapeutic intervention in order to improve the following deficits and impairments:  Pain, Improper body mechanics, Increased muscle spasms, Postural dysfunction, Decreased range of motion, Decreased strength  Visit Diagnosis: Cervicalgia  Abnormal posture  Chronic right shoulder pain     Problem List Patient Active Problem List   Diagnosis Date Noted  . DDD (degenerative disc disease), cervical 04/05/2019  . Palpitations 08/03/2016  . Common migraine with intractable migraine 06/19/2016  . Vasovagal near syncope 06/18/2016  . Pre-syncope 06/18/2016  . Dizziness 04/08/2012  . Anemia associated with acute blood loss--due to adhesions and C/S 11/14/2011  . Status post repeat low transverse cesarean section and BTL 11/11/2011  . Menstrual  cycle disorder 07/09/2011  . Gestational diabetes mellitus in pregnancy 07/09/2011    Scot Jun, PTA 05/25/2019, 9:29 AM  [place], [place], [address] Phone: [phone]   Fax:  [phone]  Name: [name] MRN: [id number] Date of Birth: April 09, 1975

## 2019-05-30 ENCOUNTER — Encounter: Payer: Self-pay | Admitting: Physical Therapy

## 2019-05-30 ENCOUNTER — Ambulatory Visit: Payer: 59 | Admitting: Physical Therapy

## 2019-05-30 ENCOUNTER — Other Ambulatory Visit: Payer: Self-pay

## 2019-05-30 DIAGNOSIS — G8929 Other chronic pain: Secondary | ICD-10-CM

## 2019-05-30 DIAGNOSIS — M25511 Pain in right shoulder: Secondary | ICD-10-CM | POA: Diagnosis not present

## 2019-05-30 DIAGNOSIS — M542 Cervicalgia: Secondary | ICD-10-CM | POA: Diagnosis not present

## 2019-05-30 DIAGNOSIS — M62838 Other muscle spasm: Secondary | ICD-10-CM

## 2019-05-30 DIAGNOSIS — R293 Abnormal posture: Secondary | ICD-10-CM | POA: Diagnosis not present

## 2019-05-30 NOTE — Therapy (Signed)
[place], [place], [address] Phone: [phone]   Fax:  [phone]  Physical Therapy Treatment  Patient Details  Name: [name] MRN: [id number] Date of Birth: Dec 20, 1975 Referring Provider (PT): Dr. Darene Lamer   Encounter Date: 05/30/2019  PT End of Session - 05/30/19 0920    Visit Number  12    Date for PT Re-Evaluation  06/10/19    PT Start Time  0845    PT Stop Time  0921    PT Time Calculation (min)  36 min    Activity Tolerance  Patient tolerated treatment well       Past Medical History:  Diagnosis Date  . History of chicken pox   . History of gestational diabetes   . History of measles, mumps, or rubella   . No pertinent past medical history     Past Surgical History:  Procedure Laterality Date  . BREAST CYST EXCISION  2006  . CESAREAN SECTION    . TUBAL LIGATION      There were no vitals filed for this visit.  Subjective Assessment - 05/30/19 0848    Subjective  MD put her on light duty at work for another six weeks.    Currently in Pain?  Yes    Pain Score  2     Pain Location  Neck                       [facility] Adult PT Treatment/Exercise - 05/30/19 0001      Neck Exercises: Machines for Strengthening   UBE (Upper Arm Bike)  level 4 x 6 minutes ( 3 fwd/3 back)    Cybex Row  35lb 2x10    Cybex Chest Press  20lb 2x10     Lat Pull  35lb 2x10    Other Machines for Strengthening  shrugs with 5# light upper trap and levator stretches      Neck Exercises: Standing   Other Standing Exercises  ER red 15 x 2; Triceps ext 25lb 2x10,     Other Standing Exercises  Flex horiz abd 3lb 2x10       Neck Exercises: Seated   Neck Retraction  20 reps;3 secs    Other Seated Exercise  S2S yello wball with chest press 2x10                PT Short Term Goals - 04/20/19 0912      PT SHORT TERM GOAL #1   Title  independent with initial HEP    Status  Achieved         PT Long Term Goals - 05/25/19 [id number]      PT LONG TERM GOAL #1   Title  Improve posture and alignment with patient to demonstrate improve upright posture            Plan - 05/30/19 0921    Clinical Impression Statement  Pt continues to report a 2/10 pain everyday. She reports that she has been doing her exercises. Today's she completed all interventions well with good strength and ROM. Postural cues given with seated rows. Tactile cues needed with cervical retractions to prevent postural sway.    PT Frequency  1x / week   Spoke to lead PT to decrease to once a week   PT Duration  8 weeks    PT Treatment/Interventions  ADLs/Self Care Home Management;Electrical Stimulation;Moist Heat;Traction;Ultrasound;Therapeutic activities;Therapeutic exercise;Manual  techniques;Dry needling;Patient/family education    PT Next Visit Plan  decrease to once a week. Goes back to Md on the 25th       Patient will benefit from skilled therapeutic intervention in order to improve the following deficits and impairments:     Visit Diagnosis: Abnormal posture  Cervicalgia  Chronic right shoulder pain  Other muscle spasm     Problem List Patient Active Problem List   Diagnosis Date Noted  . DDD (degenerative disc disease), cervical 04/05/2019  . Palpitations 08/03/2016  . Common migraine with intractable migraine 06/19/2016  . Vasovagal near syncope 06/18/2016  . Pre-syncope 06/18/2016  . Dizziness 04/08/2012  . Anemia associated with acute blood loss--due to adhesions and C/S 11/14/2011  . Status post repeat low transverse cesarean section and BTL 11/11/2011  . Menstrual cycle disorder 07/09/2011  . Gestational diabetes mellitus in pregnancy 07/09/2011    Scot Jun, PTA 05/30/2019, 9:26 AM  [place], [place], [address] Phone: [phone]   Fax:  [phone]  Name: Amariea Zich MRN: [id number] Date of Birth: 18-Mar-1976

## 2019-06-01 ENCOUNTER — Ambulatory Visit: Payer: 59 | Admitting: Physical Therapy

## 2019-06-07 MED FILL — MELOXICAM 15 MG TABLET: 15 | 30 days supply | Qty: 30 | Fill #1

## 2019-06-09 ENCOUNTER — Ambulatory Visit: Payer: 59 | Admitting: Physical Therapy

## 2019-06-09 ENCOUNTER — Other Ambulatory Visit: Payer: Self-pay

## 2019-06-09 DIAGNOSIS — M542 Cervicalgia: Secondary | ICD-10-CM | POA: Diagnosis not present

## 2019-06-09 DIAGNOSIS — R293 Abnormal posture: Secondary | ICD-10-CM

## 2019-06-09 DIAGNOSIS — G8929 Other chronic pain: Secondary | ICD-10-CM | POA: Diagnosis not present

## 2019-06-09 DIAGNOSIS — M62838 Other muscle spasm: Secondary | ICD-10-CM | POA: Diagnosis not present

## 2019-06-09 DIAGNOSIS — M25511 Pain in right shoulder: Secondary | ICD-10-CM | POA: Diagnosis not present

## 2019-06-09 NOTE — Therapy (Signed)
[place], [place], [address] Phone: [phone]   Fax:  [phone]  Physical Therapy Treatment  Patient Details  Name: [name] MRN: [id number] Date of Birth: 1976/01/25 Referring Provider (PT): Dr. Darene Lamer   Encounter Date: 06/09/2019  PT End of Session - 06/09/19 0824    Visit Number  13    Date for PT Re-Evaluation  06/10/19    PT Start Time  0800    PT Stop Time  0830    PT Time Calculation (min)  30 min       Past Medical History:  Diagnosis Date  . History of chicken pox   . History of gestational diabetes   . History of measles, mumps, or rubella   . No pertinent past medical history     Past Surgical History:  Procedure Laterality Date  . BREAST CYST EXCISION  2006  . CESAREAN SECTION    . TUBAL LIGATION      There were no vitals filed for this visit.  Subjective Assessment - 06/09/19 0755    Subjective  doing better - pain still there. working on strengthening    Currently in Pain?  Yes    Pain Score  2     Pain Location  Neck                       [facility] Adult PT Treatment/Exercise - 06/09/19 0001      Neck Exercises: Machines for Strengthening   UBE (Upper Arm Bike)  level 4 x 6 minutes ( 3 fwd/3 back)    Cybex Row  35lb 2x10    Cybex Chest Press  20lb 2x10     Lat Pull  35lb 2x10      Neck Exercises: Standing   Neck Retraction  20 reps;3 secs   head on ball on wall   Other Standing Exercises  3# flex-abd- lower 10x then reverse 10 x   3# PNF 10x   Other Standing Exercises  cable pulley exercises   red tband scap stab with hands on wall            PT Education - 06/09/19 0816    Education Details  green tband scap stab ex, red tband stab with hands on wall    Person(s) Educated  Patient    Methods  Explanation;Demonstration;Handout    Comprehension  Verbalized understanding       PT Short Term Goals - 04/20/19 0912      PT  SHORT TERM GOAL #1   Title  independent with initial HEP    Status  Achieved        PT Long Term Goals - 06/09/19 7915      PT LONG TERM GOAL #1   Title  Improve posture and alignment with patient to demonstrate improve upright posture    Status  Partially Met      PT LONG TERM GOAL #2   Title  increase cervical ROM to WNL's    Status  Achieved      PT LONG TERM GOAL #3   Title  understand posture and body mechanics for ADL's and work    Status  Achieved      PT LONG TERM GOAL #4   Title  decrease pain 50%    Status  Achieved      PT LONG TERM GOAL #5   Title  work  without pain    Baseline  light duty    Status  Partially Met            Plan - 06/09/19 0825    Clinical Impression Statement  goals partially met. pt back at [facility] working out and has HEP from clinic for strength. pt verb overall better and better BM/postural awareness. pain still a constant 2/10    PT Treatment/Interventions  ADLs/Self Care Home Management;Electrical Stimulation;Moist Heat;Traction;Ultrasound;Therapeutic activities;Therapeutic exercise;Manual techniques;Dry needling;Patient/family education    PT Next Visit Plan  HOLD. Pt to see MD 3/25 and will call after       Patient will benefit from skilled therapeutic intervention in order to improve the following deficits and impairments:  Pain, Improper body mechanics, Increased muscle spasms, Postural dysfunction, Decreased range of motion, Decreased strength  Visit Diagnosis: Abnormal posture  Cervicalgia     Problem List Patient Active Problem List   Diagnosis Date Noted  . DDD (degenerative disc disease), cervical 04/05/2019  . Palpitations 08/03/2016  . Common migraine with intractable migraine 06/19/2016  . Vasovagal near syncope 06/18/2016  . Pre-syncope 06/18/2016  . Dizziness 04/08/2012  . Anemia associated with acute blood loss--due to adhesions and C/S 11/14/2011  . Status post repeat low transverse cesarean section and  BTL 11/11/2011  . Menstrual cycle disorder 07/09/2011  . Gestational diabetes mellitus in pregnancy 07/09/2011    Jermario Kalmar,ANGIE PTA 06/09/2019, 8:27 AM  [place], [place], [address] Phone: [phone]   Fax:  [phone]  Name: Haru Anspaugh MRN: [id number] Date of Birth: Dec 10, 1975

## 2019-06-15 ENCOUNTER — Ambulatory Visit (INDEPENDENT_AMBULATORY_CARE_PROVIDER_SITE_OTHER): Payer: 59 | Admitting: Sports Medicine

## 2019-06-15 ENCOUNTER — Other Ambulatory Visit: Payer: Self-pay

## 2019-06-15 ENCOUNTER — Encounter: Payer: Self-pay | Admitting: Sports Medicine

## 2019-06-15 DIAGNOSIS — M5030 Other cervical disc degeneration, unspecified cervical region: Secondary | ICD-10-CM | POA: Diagnosis not present

## 2019-06-15 MED ORDER — TRAMADOL HCL 50 MG PO TABS: 50.0000 mg | ORAL_TABLET | Freq: Three times a day (TID) | ORAL | 0 refills | Status: DC | PRN

## 2019-06-15 MED ORDER — CYCLOBENZAPRINE HCL 10 MG PO TABS: ORAL_TABLET | ORAL | 0 refills | Status: DC

## 2019-06-15 MED FILL — traMADol HCL 50 MG TABS: 50 | 4 days supply | Qty: 21 | Fill #0

## 2019-06-15 MED FILL — CYCLOBENZAPRINE HCL 10 MG T: 10 | 30 days supply | Qty: 90 | Fill #0

## 2019-06-15 NOTE — Assessment & Plan Note (Signed)
This is a pleasant 44 year old female, she has multilevel cervical degenerative disc disease. We have treated her conservatively with medications, NSAIDs, steroids, physical therapy. She has had a cervical epidural, unfortunately she continues to have pain. Adding tramadol, she can continue her meloxicam and Flexeril. DDD is the worst at C6-C7 but lesser so at C5-C6. At this point I do think we need a surgical opinion, she would like to see Dr. Dominica Severin cram with [facility] neurosurgery.

## 2019-06-15 NOTE — Progress Notes (Signed)
    Procedures performed today:    None.  Independent interpretation of notes and tests performed by another provider:   None.  Brief History, Exam, Impression, and Recommendations:    DDD (degenerative disc disease), cervical This is a pleasant 44 year old female, she has multilevel cervical degenerative disc disease. We have treated her conservatively with medications, NSAIDs, steroids, physical therapy. She has had a cervical epidural, unfortunately she continues to have pain. Adding tramadol, she can continue her meloxicam and Flexeril. DDD is the worst at C6-C7 but lesser so at C5-C6. At this point I do think we need a surgical opinion, she would like to see Dr. Dominica Severin cram with [facility] neurosurgery.    ___________________________________________ Gwen Her. Dianah Field, M.D., ABFM., CAQSM. Primary Care and [place] Instructor of [place] [facility] [employer]

## 2019-06-29 DIAGNOSIS — M5412 Radiculopathy, cervical region: Secondary | ICD-10-CM | POA: Diagnosis not present

## 2019-06-29 DIAGNOSIS — R030 Elevated blood-pressure reading, without diagnosis of hypertension: Secondary | ICD-10-CM | POA: Diagnosis not present

## 2019-06-29 DIAGNOSIS — G959 Disease of spinal cord, unspecified: Secondary | ICD-10-CM | POA: Diagnosis not present

## 2019-07-18 DIAGNOSIS — Z1152 Encounter for screening for COVID-19: Secondary | ICD-10-CM | POA: Diagnosis not present

## 2019-07-21 DIAGNOSIS — Z01818 Encounter for other preprocedural examination: Secondary | ICD-10-CM | POA: Diagnosis not present

## 2019-07-21 DIAGNOSIS — G959 Disease of spinal cord, unspecified: Secondary | ICD-10-CM | POA: Diagnosis not present

## 2019-07-24 DIAGNOSIS — M4802 Spinal stenosis, cervical region: Secondary | ICD-10-CM | POA: Diagnosis not present

## 2019-07-24 DIAGNOSIS — M50221 Other cervical disc displacement at C4-C5 level: Secondary | ICD-10-CM | POA: Diagnosis not present

## 2019-07-24 DIAGNOSIS — G959 Disease of spinal cord, unspecified: Secondary | ICD-10-CM | POA: Diagnosis not present

## 2019-07-24 DIAGNOSIS — M47812 Spondylosis without myelopathy or radiculopathy, cervical region: Secondary | ICD-10-CM | POA: Diagnosis not present

## 2019-07-24 MED FILL — METHOCARBAMOL 500 MG TABLET: 500 | 5 days supply | Qty: 45 | Fill #0

## 2019-07-24 MED FILL — OXYCODONE-ACETAMINOPHEN 5-3: 5-325 | 7 days supply | Qty: 30 | Fill #0

## 2019-08-29 DIAGNOSIS — M5412 Radiculopathy, cervical region: Secondary | ICD-10-CM | POA: Diagnosis not present

## 2019-08-29 DIAGNOSIS — G959 Disease of spinal cord, unspecified: Secondary | ICD-10-CM | POA: Diagnosis not present

## 2019-10-05 DIAGNOSIS — M5412 Radiculopathy, cervical region: Secondary | ICD-10-CM | POA: Diagnosis not present

## 2019-10-17 ENCOUNTER — Ambulatory Visit: Payer: 59 | Attending: Neurosurgery | Admitting: Physical Therapy

## 2019-10-17 ENCOUNTER — Other Ambulatory Visit: Payer: Self-pay

## 2019-10-17 ENCOUNTER — Encounter: Payer: Self-pay | Admitting: Physical Therapy

## 2019-10-17 DIAGNOSIS — M25611 Stiffness of right shoulder, not elsewhere classified: Secondary | ICD-10-CM | POA: Diagnosis not present

## 2019-10-17 DIAGNOSIS — M25511 Pain in right shoulder: Secondary | ICD-10-CM | POA: Insufficient documentation

## 2019-10-17 DIAGNOSIS — M62838 Other muscle spasm: Secondary | ICD-10-CM | POA: Insufficient documentation

## 2019-10-17 DIAGNOSIS — R293 Abnormal posture: Secondary | ICD-10-CM | POA: Diagnosis not present

## 2019-10-17 DIAGNOSIS — G8929 Other chronic pain: Secondary | ICD-10-CM | POA: Insufficient documentation

## 2019-10-17 DIAGNOSIS — M542 Cervicalgia: Secondary | ICD-10-CM | POA: Diagnosis not present

## 2019-10-17 NOTE — Therapy (Signed)
[place], [place], [address] Phone: [phone]   Fax:  [phone]  Physical Therapy Evaluation  Patient Details  Name: [name] MRN: [id number] Date of Birth: [birth year] Referring Provider (PT): Saintclair Halsted   Encounter Date: 10/17/2019   PT End of Session - 10/17/19 0817    Visit Number 1    Number of Visits 12    Date for PT Re-Evaluation 12/18/19    PT Start Time 0751    PT Stop Time 0833    PT Time Calculation (min) 42 min    Activity Tolerance Patient tolerated treatment well    Behavior During Therapy [facility] for tasks assessed/performed           Past Medical History:  Diagnosis Date  . History of chicken pox   . History of gestational diabetes   . History of measles, mumps, or rubella   . No pertinent past medical history     Past Surgical History:  Procedure Laterality Date  . BREAST CYST EXCISION  2006  . CESAREAN SECTION    . TUBAL LIGATION      There were no vitals filed for this visit.    Subjective Assessment - 10/17/19 0758    Subjective Patient had neck pain February 2020, she had PT with resolution of some shoulder pain but hte neck pain continued.  She had MRI in January, she underwent ACDF C4-7 on Jul 24, 2019.  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 Yes    Pain Score 2     Pain Location Neck    Pain Descriptors / Indicators Spasm;Tightness    Pain Type Acute pain;Surgical pain    Pain Radiating Towards feels pressure in the chest at times    Pain Onset More than a  month ago    Pain Frequency Constant    Aggravating Factors  lifting will increase to 8/10    Pain Relieving Factors rest pain can be 0/10    Effect of Pain on Daily Activities difficulty work, lifting, dressing and doing hair              [facility] PT Assessment - 10/17/19 0001      Assessment   Medical Diagnosis s/p ACDF C4-7    Referring Provider (PT) Saintclair Halsted    Onset Date/Surgical Date 07/24/19    Hand Dominance Right      Precautions   Precautions None      Balance Screen   Has the patient fallen in the past 6 months No    Has the patient had a decrease in activity level because of a fear of falling?  No    Is the patient reluctant to leave their home because of a fear of falling?  No      Home Environment   Additional Comments does housework, has 3 children under 45, has a family member that she helps and at times has to transfer      Prior Function   Level  of Independence Independent    Vocation Full time employment    Engineer, mining, transfers patients    Leisure walks 2-5 miles a day      Posture/Postural Control   Posture Comments fwd head, rounded shoulders      AROM   Overall AROM Comments Cervical ROM decreaesd 50% flexion and rotations, decreased 75% for extnesion and side bending,     AROM Assessment Site Shoulder    Right/Left Shoulder Right    Right Shoulder Flexion 140 Degrees    Right Shoulder Internal Rotation 45 Degrees    Right Shoulder External Rotation 60 Degrees      Strength   Overall Strength Comments Right shoulder 4/5 with some c/o tightness in the right shoulder and the shoulder blade      Palpation   Palpation comment Some tightness and spasm mild tenderness in the cervial area and in the upper trap and rhomboid                      Objective measurements completed on examination: See above findings.                 PT Short Term Goals - 10/17/19 1245      PT SHORT TERM GOAL #1   Title  independent with initial HEP    Time 2    Period Weeks    Status New             PT Long Term Goals - 10/17/19 8099      PT LONG TERM GOAL #1   Title Improve posture and alignment with patient to demonstrate improve upright posture    Time 8    Period Weeks    Status New      PT LONG TERM GOAL #2   Title increase cervical ROM 25%    Time 8    Period Weeks    Status New      PT LONG TERM GOAL #3   Title understand posture and body mechanics for ADL's and work    Time 8    Period Weeks    Status New      PT LONG TERM GOAL #4   Title decrease pain 50% with lifting    Time 8    Period Weeks    Status New      PT LONG TERM GOAL #5   Title report no difficulty getting dressed and doing hair    Time 8    Period Weeks    Status New                  Plan - 10/17/19 0818    Clinical Impression Statement Patient underwent a 3 level ACDF May 3rd, she had been having some pain for over a year.  She had right shoulder issues last year which started this.  She has decreased cervical and right shoulder ROM, she has weakness and with lifting her pain will increased to 8/10.  She is planning on going back to work as a Marine scientist next [place].  Use of the arms will cause "tightness and pressure" in the sternal area and chest    Clinical Decision Making Low    Rehab Potential Good    PT Frequency 1x / week    PT Duration 8 weeks    PT Treatment/Interventions ADLs/Self Care Home Management;Electrical Stimulation;Moist Heat;Ultrasound;Therapeutic activities;Therapeutic exercise;Manual techniques;Dry needling;Patient/family education;Cryotherapy    PT Next Visit  Plan She has limited visits because she was seen earlier in the year, she is also returning to work, will see one x/week and work on ROM, strength and function    Consulted and Agree with Plan of Care Patient           Patient will benefit from skilled therapeutic intervention in order to improve the following  deficits and impairments:  Pain, Improper body mechanics, Increased muscle spasms, Postural dysfunction, Decreased range of motion, Decreased strength, Impaired flexibility, Decreased activity tolerance, Cardiopulmonary status limiting activity, Decreased endurance  Visit Diagnosis: Abnormal posture - Plan: PT plan of care cert/re-cert  Cervicalgia - Plan: PT plan of care cert/re-cert  Chronic right shoulder pain - Plan: PT plan of care cert/re-cert  Other muscle spasm - Plan: PT plan of care cert/re-cert  Stiffness of right shoulder, not elsewhere classified - Plan: PT plan of care cert/re-cert     Problem List Patient Active Problem List   Diagnosis Date Noted  . DDD (degenerative disc disease), cervical 04/05/2019  . Palpitations 08/03/2016  . Common migraine with intractable migraine 06/19/2016  . Vasovagal near syncope 06/18/2016  . Pre-syncope 06/18/2016  . Dizziness 04/08/2012  . Anemia associated with acute blood loss--due to adhesions and C/S 11/14/2011  . Status post repeat low transverse cesarean section and BTL 11/11/2011  . Menstrual cycle disorder 07/09/2011  . Gestational diabetes mellitus in pregnancy 07/09/2011    Sumner Boast., PT 10/17/2019, 8:30 AM  [place], [place], [address] Phone: [phone]   Fax:  [phone]  Name: Klaudia Beirne MRN: [id number] Date of Birth: May 13, 1975

## 2019-10-17 NOTE — Patient Instructions (Signed)
Access Code: [email or web address] URL: [email or web address] Date: 10/17/2019 Prepared by: Lum Babe  Exercises Seated Cervical Rotation AROM - 3 x daily - 7 x weekly - 1 sets - 10 reps - 3 hold Seated Cervical Sidebending AROM - 3 x daily - 7 x weekly - 1 sets - 10 reps - 3 hold Seated Cervical Flexion AROM - 3 x daily - 7 x weekly - 1 sets - 10 reps - 3 hold Seated Shoulder Shrug Circles AROM Backward - 3 x daily - 7 x weekly - 1 sets - 10 reps - 2 hold Seated Scapular Retraction - 3 x daily - 7 x weekly - 1 sets - 10 reps - 2 hold Doorway Pec Stretch at 90 Degrees Abduction - 3 x daily - 7 x weekly - 1 sets - 10 reps - 10 hold

## 2019-10-27 ENCOUNTER — Encounter: Payer: Self-pay | Admitting: Physical Therapy

## 2019-10-27 ENCOUNTER — Ambulatory Visit: Payer: 59 | Attending: Neurosurgery | Admitting: Physical Therapy

## 2019-10-27 ENCOUNTER — Other Ambulatory Visit: Payer: Self-pay

## 2019-10-27 DIAGNOSIS — M25611 Stiffness of right shoulder, not elsewhere classified: Secondary | ICD-10-CM | POA: Diagnosis not present

## 2019-10-27 DIAGNOSIS — M25511 Pain in right shoulder: Secondary | ICD-10-CM | POA: Diagnosis not present

## 2019-10-27 DIAGNOSIS — R293 Abnormal posture: Secondary | ICD-10-CM | POA: Insufficient documentation

## 2019-10-27 DIAGNOSIS — G8929 Other chronic pain: Secondary | ICD-10-CM | POA: Diagnosis not present

## 2019-10-27 DIAGNOSIS — M62838 Other muscle spasm: Secondary | ICD-10-CM | POA: Insufficient documentation

## 2019-10-27 DIAGNOSIS — M542 Cervicalgia: Secondary | ICD-10-CM | POA: Insufficient documentation

## 2019-10-27 NOTE — Therapy (Signed)
[place], [place], [address] Phone: [phone]   Fax:  [phone]  Physical Therapy Treatment  Patient Details  Name: [name] MRN: [id number] Date of Birth: Mar 08, 1976 Referring Provider (PT): Saintclair Halsted   Encounter Date: 10/27/2019   PT End of Session - 10/27/19 0945    Visit Number 2    Number of Visits 12    Date for PT Re-Evaluation 12/18/19    PT Start Time 0845    PT Stop Time 0930    PT Time Calculation (min) 45 min    Activity Tolerance Patient tolerated treatment well    Behavior During Therapy [facility] for tasks assessed/performed           Past Medical History:  Diagnosis Date  . History of chicken pox   . History of gestational diabetes   . History of measles, mumps, or rubella   . No pertinent past medical history     Past Surgical History:  Procedure Laterality Date  . BREAST CYST EXCISION  2006  . CESAREAN SECTION    . TUBAL LIGATION      There were no vitals filed for this visit.   Subjective Assessment - 10/27/19 0847    Subjective Pt reports she returned to work light duty this week; states that she had trouble with light duty activities. Pt will return to MD next Thursday, 11/02/19.    Currently in Pain? Yes    Pain Score 2     Pain Location Neck                             [facility] Adult PT Treatment/Exercise - 10/27/19 0001      Exercises   Exercises Neck;Shoulder      Neck Exercises: Machines for Strengthening   UBE (Upper Arm Bike) L3 3 min fwd/3 min bkwd      Neck Exercises: Theraband   Shoulder Extension 20 reps    Shoulder Extension Limitations yellow TB    Rows 20 reps    Rows Limitations yellow TB    Shoulder External Rotation 20 reps    Shoulder External Rotation Limitations yellow TB    Shoulder Internal Rotation 20 reps    Shoulder Internal Rotation Limitations yellow TB      Neck Exercises: Standing   Neck Retraction  20 reps;3 secs    Neck Retraction Limitations push into ball    Other Standing Exercises lift overhead with ball x10      Shoulder Exercises: Standing   Other Standing Exercises yellow TB hand on wall 3 ways x10    Other Standing Exercises shoulder flexion/abduction with 1# dumbbell to cabinet x10 B      Manual Therapy   Manual Therapy Soft tissue mobilization;Passive ROM    Soft tissue mobilization STM to B UT and suboccipitals    Passive ROM cervical PROM all motions                    PT Short Term Goals - 10/27/19 0947      PT SHORT TERM GOAL #1   Title independent with initial HEP    Time 2    Period Weeks    Status Achieved             PT Long Term Goals - 10/27/19 0947      PT LONG TERM GOAL #1  Title Improve posture and alignment with patient to demonstrate improve upright posture    Time 8    Period Weeks    Status On-going      PT LONG TERM GOAL #2   Title increase cervical ROM 25%    Time 8    Period Weeks    Status On-going      PT LONG TERM GOAL #3   Title understand posture and body mechanics for ADL's and work    Time 8    Period Weeks    Status On-going      PT LONG TERM GOAL #4   Title decrease pain 50% with lifting    Time 8    Period Weeks    Status On-going      PT LONG TERM GOAL #5   Title report no difficulty getting dressed and doing hair    Time 8    Period Weeks    Status On-going                 Plan - 10/27/19 0945    Clinical Impression Statement Pt tolerated progression to TE well. Cuing to decrease compensation with standing shoulder ex's. Pt reported mild pressure in sternal and chest area with strengthening ex's but reported no increased shoulder or neck pain with exercise. Pt reports relief with STM.    PT Treatment/Interventions ADLs/Self Care Home Management;Electrical Stimulation;Moist Heat;Ultrasound;Therapeutic activities;Therapeutic exercise;Manual techniques;Dry needling;Patient/family  education;Cryotherapy    PT Next Visit Plan She has limited visits because she was seen earlier in the year, she is also returning to work, will see one x/week and work on ROM, strength and function    Consulted and Agree with Plan of Care Patient           Patient will benefit from skilled therapeutic intervention in order to improve the following deficits and impairments:  Pain, Improper body mechanics, Increased muscle spasms, Postural dysfunction, Decreased range of motion, Decreased strength, Impaired flexibility, Decreased activity tolerance, Cardiopulmonary status limiting activity, Decreased endurance  Visit Diagnosis: Abnormal posture  Cervicalgia  Chronic right shoulder pain  Other muscle spasm  Stiffness of right shoulder, not elsewhere classified     Problem List Patient Active Problem List   Diagnosis Date Noted  . DDD (degenerative disc disease), cervical 04/05/2019  . Palpitations 08/03/2016  . Common migraine with intractable migraine 06/19/2016  . Vasovagal near syncope 06/18/2016  . Pre-syncope 06/18/2016  . Dizziness 04/08/2012  . Anemia associated with acute blood loss--due to adhesions and C/S 11/14/2011  . Status post repeat low transverse cesarean section and BTL 11/11/2011  . Menstrual cycle disorder 07/09/2011  . Gestational diabetes mellitus in pregnancy 07/09/2011   [name], PT, DPT [name] Amnah Breuer 10/27/2019, 9:47 AM  [place], [place], [address] Phone: [phone]   Fax:  [phone]  Name: Corin Formisano MRN: [id number] Date of Birth: 1975-08-24

## 2019-10-30 ENCOUNTER — Ambulatory Visit: Payer: 59 | Admitting: Physical Therapy

## 2019-10-30 ENCOUNTER — Other Ambulatory Visit: Payer: Self-pay

## 2019-10-30 ENCOUNTER — Encounter: Payer: Self-pay | Admitting: Physical Therapy

## 2019-10-30 DIAGNOSIS — M542 Cervicalgia: Secondary | ICD-10-CM | POA: Diagnosis not present

## 2019-10-30 DIAGNOSIS — R293 Abnormal posture: Secondary | ICD-10-CM | POA: Diagnosis not present

## 2019-10-30 DIAGNOSIS — M25511 Pain in right shoulder: Secondary | ICD-10-CM

## 2019-10-30 DIAGNOSIS — M62838 Other muscle spasm: Secondary | ICD-10-CM

## 2019-10-30 DIAGNOSIS — G8929 Other chronic pain: Secondary | ICD-10-CM | POA: Diagnosis not present

## 2019-10-30 DIAGNOSIS — M25611 Stiffness of right shoulder, not elsewhere classified: Secondary | ICD-10-CM | POA: Diagnosis not present

## 2019-10-30 NOTE — Therapy (Signed)
[place], [place], [address] Phone: [phone]   Fax:  [phone]  Physical Therapy Treatment  Patient Details  Name: [name] MRN: [id number] Date of Birth: [birth year] Referring Provider (PT): Saintclair Halsted   Encounter Date: 10/30/2019   PT End of Session - 10/30/19 5374    Visit Number 3    Number of Visits 12    Date for PT Re-Evaluation 12/18/19    PT Start Time 8270    PT Stop Time 1345    PT Time Calculation (min) 40 min    Activity Tolerance Patient tolerated treatment well    Behavior During Therapy [facility] for tasks assessed/performed           Past Medical History:  Diagnosis Date  . History of chicken pox   . History of gestational diabetes   . History of measles, mumps, or rubella   . No pertinent past medical history     Past Surgical History:  Procedure Laterality Date  . BREAST CYST EXCISION  2006  . CESAREAN SECTION    . TUBAL LIGATION      There were no vitals filed for this visit.   Subjective Assessment - 10/30/19 1306    Subjective Patient reports that the light duty is really causing  the neck pain and tightness and into the chest    Currently in Pain? Yes    Pain Score 2     Pain Location Neck    Aggravating Factors  at end of the shift pain will be up to 8/10    Pain Relieving Factors rest pain can be down to a 0/10                             [facility] Adult PT Treatment/Exercise - 10/30/19 0001      Neck Exercises: Machines for Strengthening   UBE (Upper Arm Bike) L3 3 min fwd/3 min bkwd      Neck Exercises: Theraband   Shoulder Extension 20 reps    Shoulder Extension Limitations yellow TB    Rows 20 reps    Rows Limitations yellow TB    Shoulder External Rotation 20 reps    Shoulder External Rotation Limitations yellow TB      Neck Exercises: Standing   Other Standing Exercises lift overhead with ball x10    Other Standing  Exercises corner stretches      Shoulder Exercises: Standing   Other Standing Exercises shoulder flexion/abduction with 1# dumbbell to cabinet x10 B      Manual Therapy   Manual Therapy Soft tissue mobilization;Passive ROM    Soft tissue mobilization STM to B UT and suboccipitals, added some STM to the pectorals just below the clavicle    Passive ROM cervical PROM all motions                    PT Short Term Goals - 10/27/19 0947      PT SHORT TERM GOAL #1   Title independent with initial HEP    Time 2    Period Weeks    Status Achieved             PT Long Term Goals - 10/30/19 1719      PT LONG TERM GOAL #1   Title Improve posture and alignment with patient to demonstrate improve upright posture    Status  Partially Met      PT LONG TERM GOAL #2   Title increase cervical ROM 25%    Status Partially Met      PT LONG TERM GOAL #3   Title understand posture and body mechanics for ADL's and work    Status Partially Met                 Plan - 10/30/19 1611    Clinical Impression Statement Patient reports some increase neck pain, upper trap and chest pressure and tightness with starting the light duty work, she is able to demonstrate good posture without any difficulty.  She was very tender in the pectoral area, this is where she c/o the pressure in the chest, so I did some STM to this area today    PT Next Visit Plan see how the pressure is, how the pain is as she is back working light duty, will see MD soon    Consulted and Agree with Plan of Care Patient           Patient will benefit from skilled therapeutic intervention in order to improve the following deficits and impairments:  Pain, Improper body mechanics, Increased muscle spasms, Postural dysfunction, Decreased range of motion, Decreased strength, Impaired flexibility, Decreased activity tolerance, Cardiopulmonary status limiting activity, Decreased endurance  Visit Diagnosis: Abnormal  posture  Cervicalgia  Chronic right shoulder pain  Other muscle spasm  Stiffness of right shoulder, not elsewhere classified     Problem List Patient Active Problem List   Diagnosis Date Noted  . DDD (degenerative disc disease), cervical 04/05/2019  . Palpitations 08/03/2016  . Common migraine with intractable migraine 06/19/2016  . Vasovagal near syncope 06/18/2016  . Pre-syncope 06/18/2016  . Dizziness 04/08/2012  . Anemia associated with acute blood loss--due to adhesions and C/S 11/14/2011  . Status post repeat low transverse cesarean section and BTL 11/11/2011  . Menstrual cycle disorder 07/09/2011  . Gestational diabetes mellitus in pregnancy 07/09/2011    Sumner Boast., PT 10/30/2019, 5:20 PM  [place], [place], [address] Phone: [phone]   Fax:  [phone]  Name: [name] MRN: [id number] Date of Birth: 11/25/1975

## 2019-10-31 ENCOUNTER — Telehealth: Payer: Self-pay | Admitting: Family Medicine

## 2019-10-31 DIAGNOSIS — Z1231 Encounter for screening mammogram for malignant neoplasm of breast: Secondary | ICD-10-CM

## 2019-10-31 NOTE — Telephone Encounter (Signed)
Patient is requesting a mammogram order be placed in epic, prior her CPE in October 28,2021  Please Advise

## 2019-10-31 NOTE — Telephone Encounter (Signed)
Pended order

## 2019-11-02 DIAGNOSIS — M5412 Radiculopathy, cervical region: Secondary | ICD-10-CM | POA: Diagnosis not present

## 2019-11-02 DIAGNOSIS — M542 Cervicalgia: Secondary | ICD-10-CM | POA: Diagnosis not present

## 2019-11-06 ENCOUNTER — Ambulatory Visit: Payer: 59 | Admitting: Physical Therapy

## 2019-11-06 ENCOUNTER — Encounter: Payer: Self-pay | Admitting: Physical Therapy

## 2019-11-06 ENCOUNTER — Other Ambulatory Visit: Payer: Self-pay

## 2019-11-06 DIAGNOSIS — R293 Abnormal posture: Secondary | ICD-10-CM | POA: Diagnosis not present

## 2019-11-06 DIAGNOSIS — G8929 Other chronic pain: Secondary | ICD-10-CM

## 2019-11-06 DIAGNOSIS — M542 Cervicalgia: Secondary | ICD-10-CM

## 2019-11-06 DIAGNOSIS — M25611 Stiffness of right shoulder, not elsewhere classified: Secondary | ICD-10-CM | POA: Diagnosis not present

## 2019-11-06 DIAGNOSIS — M62838 Other muscle spasm: Secondary | ICD-10-CM

## 2019-11-06 DIAGNOSIS — M25511 Pain in right shoulder: Secondary | ICD-10-CM | POA: Diagnosis not present

## 2019-11-06 NOTE — Therapy (Signed)
[place], [place], [address] Phone: [phone]   Fax:  [phone]  Physical Therapy Treatment  Patient Details  Name: [name] MRN: [id number] Date of Birth: [birth year] Referring Provider (PT): Saintclair Halsted   Encounter Date: 11/06/2019   PT End of Session - 11/06/19 1011    Visit Number 4    Number of Visits 12    Date for PT Re-Evaluation 12/18/19    PT Start Time 0922    PT Stop Time 1011    PT Time Calculation (min) 49 min    Activity Tolerance Patient tolerated treatment well    Behavior During Therapy [facility] for tasks assessed/performed           Past Medical History:  Diagnosis Date  . History of chicken pox   . History of gestational diabetes   . History of measles, mumps, or rubella   . No pertinent past medical history     Past Surgical History:  Procedure Laterality Date  . BREAST CYST EXCISION  2006  . CESAREAN SECTION    . TUBAL LIGATION      There were no vitals filed for this visit.   Subjective Assessment - 11/06/19 0925    Subjective Patient saw the neurosurgeon last week, he took her out of work until he sees her again September 9.  She continues to c/o pressure in the coillar bone area, reports that she was sore after the last treatment    Currently in Pain? Yes    Pain Score 3     Pain Location Neck    Pain Orientation Right    Pain Descriptors / Indicators Pressure;Tightness;Spasm    Aggravating Factors  working                             [facility] Adult PT Treatment/Exercise - 11/06/19 0001      Neck Exercises: Machines for Strengthening   UBE (Upper Arm Bike) L4 3 min fwd/3 min bkwd    Cybex Row 15# 2x10    Cybex Chest Press 10# 2x10    Lat Pull 20# 2x10    Other Machines for Strengthening triceps 20#, biceps 10# 2x10      Neck Exercises: Theraband   Shoulder Extension 20 reps;Red    Rows 20 reps;Red    Shoulder External Rotation  20 reps;Red      Neck Exercises: Standing   Other Standing Exercises lift overhead with ball x10    Other Standing Exercises corner stretches      Shoulder Exercises: Standing   Other Standing Exercises facine wall I's and Y's      Shoulder Exercises: ROM/Strengthening   "W" Arms 15 reps      Manual Therapy   Manual Therapy Soft tissue mobilization;Passive ROM;Neural Stretch    Soft tissue mobilization STM to B UT and suboccipitals, added some STM to the pectorals just below the clavicle    Passive ROM cervical PROM all motions    Neural Stretch median nerve glides                  PT Education - 11/06/19 1011    Education Details gave Median nerve stretch and upper trap and levator stretch    Person(s) Educated Patient    Methods Explanation;Demonstration    Comprehension Verbalized understanding;Returned demonstration;Verbal cues required;Tactile cues required  PT Short Term Goals - 10/27/19 0947      PT SHORT TERM GOAL #1   Title independent with initial HEP    Time 2    Period Weeks    Status Achieved             PT Long Term Goals - 10/30/19 1719      PT LONG TERM GOAL #1   Title Improve posture and alignment with patient to demonstrate improve upright posture    Status Partially Met      PT LONG TERM GOAL #2   Title increase cervical ROM 25%    Status Partially Met      PT LONG TERM GOAL #3   Title understand posture and body mechanics for ADL's and work    Status Partially Met                 Plan - 11/06/19 1011    Clinical Impression Statement Patient is very tight in the cervical motions and mms, she was able to allow good passive stretch, median nerve flossing caused some tingling in her fingers, gave this for HEP and also upper trap and levator stretches  She has been taken out of work for the next 3 weeks    PT Next Visit Plan continue to address strength, posture and stiffness    Consulted and Agree with Plan of Care  Patient           Patient will benefit from skilled therapeutic intervention in order to improve the following deficits and impairments:  Pain, Improper body mechanics, Increased muscle spasms, Postural dysfunction, Decreased range of motion, Decreased strength, Impaired flexibility, Decreased activity tolerance, Cardiopulmonary status limiting activity, Decreased endurance  Visit Diagnosis: Abnormal posture  Cervicalgia  Chronic right shoulder pain  Other muscle spasm  Stiffness of right shoulder, not elsewhere classified     Problem List Patient Active Problem List   Diagnosis Date Noted  . DDD (degenerative disc disease), cervical 04/05/2019  . Palpitations 08/03/2016  . Common migraine with intractable migraine 06/19/2016  . Vasovagal near syncope 06/18/2016  . Pre-syncope 06/18/2016  . Dizziness 04/08/2012  . Anemia associated with acute blood loss--due to adhesions and C/S 11/14/2011  . Status post repeat low transverse cesarean section and BTL 11/11/2011  . Menstrual cycle disorder 07/09/2011  . Gestational diabetes mellitus in pregnancy 07/09/2011    Sumner Boast., PT 11/06/2019, 10:13 AM  [place], [place], [address] Phone: [phone]   Fax:  [phone]  Name: [name] MRN: [id number] Date of Birth: Sep 20, 1975

## 2019-11-14 ENCOUNTER — Other Ambulatory Visit: Payer: Self-pay

## 2019-11-14 ENCOUNTER — Encounter: Payer: Self-pay | Admitting: Physical Therapy

## 2019-11-14 ENCOUNTER — Ambulatory Visit: Payer: 59 | Admitting: Physical Therapy

## 2019-11-14 DIAGNOSIS — M542 Cervicalgia: Secondary | ICD-10-CM

## 2019-11-14 DIAGNOSIS — G8929 Other chronic pain: Secondary | ICD-10-CM | POA: Diagnosis not present

## 2019-11-14 DIAGNOSIS — M25511 Pain in right shoulder: Secondary | ICD-10-CM | POA: Diagnosis not present

## 2019-11-14 DIAGNOSIS — R293 Abnormal posture: Secondary | ICD-10-CM | POA: Diagnosis not present

## 2019-11-14 DIAGNOSIS — M62838 Other muscle spasm: Secondary | ICD-10-CM

## 2019-11-14 DIAGNOSIS — M25611 Stiffness of right shoulder, not elsewhere classified: Secondary | ICD-10-CM

## 2019-11-14 NOTE — Therapy (Signed)
[place], [place], [address] Phone: [phone]   Fax:  [phone]  Physical Therapy Treatment  Patient Details  Name: [name] MRN: [id number] Date of Birth: 07-Sep-1975 Referring Provider (PT): Saintclair Halsted   Encounter Date: 11/14/2019   PT End of Session - 11/14/19 0928    Visit Number 5    Number of Visits 12    Date for PT Re-Evaluation 12/18/19    PT Start Time 0840    PT Stop Time 0925    PT Time Calculation (min) 45 min    Activity Tolerance Patient tolerated treatment well    Behavior During Therapy [facility] for tasks assessed/performed           Past Medical History:  Diagnosis Date  . History of chicken pox   . History of gestational diabetes   . History of measles, mumps, or rubella   . No pertinent past medical history     Past Surgical History:  Procedure Laterality Date  . BREAST CYST EXCISION  2006  . CESAREAN SECTION    . TUBAL LIGATION      There were no vitals filed for this visit.   Subjective Assessment - 11/14/19 0842    Subjective Patient is reporting she is out of work until she sees Dr. Saintclair Halsted on September 9th.  Reports that when she returned to work she was light duty and did 4 hours a day and reports that the pain returned and the pressure and she did not tolerate this well    Currently in Pain? Yes    Pain Score 2     Pain Location Neck   Headache   Aggravating Factors  activity                             [facility] Adult PT Treatment/Exercise - 11/14/19 0001      Therapeutic Activites    Therapeutic Activities Work Economist    Work Economist started education about returning to work and started some problem solving with her, educated some about lifting and transfers, education about FCE, will need to simulte all of these at the next visit      Exercises   Exercises Neck      Neck Exercises: Machines for Strengthening   UBE (Upper  Arm Bike) L5 3 min fwd/3 min bkwd    Cybex Row 15# 2x10    Cybex Chest Press 10# 2x10    Lat Pull 20# 2x10    Other Machines for Strengthening triceps 20#, biceps 10# 2x10      Neck Exercises: Standing   Other Standing Exercises lift overhead with ball x10    Other Standing Exercises W backs      Shoulder Exercises: Stretch   Cross Chest Stretch 3 reps;10 seconds    Other Shoulder Stretches median nerve, ulnar nerve and radial nerve stretch on the right                    PT Short Term Goals - 10/27/19 0947      PT SHORT TERM GOAL #1   Title independent with initial HEP    Time 2    Period Weeks    Status Achieved             PT Long Term Goals - 11/14/19 0930      PT LONG TERM GOAL #1  Title Improve posture and alignment with patient to demonstrate improve upright posture    Status Partially Met                 Plan - 11/14/19 0928    Clinical Impression Statement Patient continues to be very tight in the upper traps and the neck, has a HA today, She reports concerns about going back to work, she went back to work 4 hours a day and really did not tolerate theis well.  Reports that she would have pain and tightness at the end of the shift.  Started education on correct and proper lifting    PT Next Visit Plan do job simulation and education on safety with body mechanics for all patient care activities that she would be doing    Consulted and Agree with Plan of Care Patient           Patient will benefit from skilled therapeutic intervention in order to improve the following deficits and impairments:  Pain, Improper body mechanics, Increased muscle spasms, Postural dysfunction, Decreased range of motion, Decreased strength, Impaired flexibility, Decreased activity tolerance, Cardiopulmonary status limiting activity, Decreased endurance  Visit Diagnosis: Abnormal posture  Cervicalgia  Chronic right shoulder pain  Other muscle spasm  Stiffness  of right shoulder, not elsewhere classified     Problem List Patient Active Problem List   Diagnosis Date Noted  . DDD (degenerative disc disease), cervical 04/05/2019  . Palpitations 08/03/2016  . Common migraine with intractable migraine 06/19/2016  . Vasovagal near syncope 06/18/2016  . Pre-syncope 06/18/2016  . Dizziness 04/08/2012  . Anemia associated with acute blood loss--due to adhesions and C/S 11/14/2011  . Status post repeat low transverse cesarean section and BTL 11/11/2011  . Menstrual cycle disorder 07/09/2011  . Gestational diabetes mellitus in pregnancy 07/09/2011    Sumner Boast., PT 11/14/2019, 9:31 AM  [place], [place], [address] Phone: [phone]   Fax:  [phone]  Name: [name] MRN: [id number] Date of Birth: September 23, 1975

## 2019-11-21 ENCOUNTER — Ambulatory Visit: Payer: 59 | Admitting: Physical Therapy

## 2019-11-21 ENCOUNTER — Encounter: Payer: Self-pay | Admitting: Physical Therapy

## 2019-11-21 ENCOUNTER — Other Ambulatory Visit: Payer: Self-pay

## 2019-11-21 DIAGNOSIS — M25611 Stiffness of right shoulder, not elsewhere classified: Secondary | ICD-10-CM

## 2019-11-21 DIAGNOSIS — G8929 Other chronic pain: Secondary | ICD-10-CM | POA: Diagnosis not present

## 2019-11-21 DIAGNOSIS — M25511 Pain in right shoulder: Secondary | ICD-10-CM | POA: Diagnosis not present

## 2019-11-21 DIAGNOSIS — M542 Cervicalgia: Secondary | ICD-10-CM | POA: Diagnosis not present

## 2019-11-21 DIAGNOSIS — M62838 Other muscle spasm: Secondary | ICD-10-CM

## 2019-11-21 DIAGNOSIS — R293 Abnormal posture: Secondary | ICD-10-CM | POA: Diagnosis not present

## 2019-11-21 NOTE — Therapy (Signed)
[place], [place], [address] Phone: [phone]   Fax:  [phone]  Physical Therapy Treatment  Patient Details  Name: [name] MRN: [id number] Date of Birth: 10-26-1975 Referring Provider (PT): Saintclair Halsted   Encounter Date: 11/21/2019   PT End of Session - 11/21/19 0932    Visit Number 6    Number of Visits 12    Date for PT Re-Evaluation 12/18/19    PT Start Time 0840    PT Stop Time 0925    PT Time Calculation (min) 45 min    Activity Tolerance Patient tolerated treatment well    Behavior During Therapy [facility] for tasks assessed/performed           Past Medical History:  Diagnosis Date  . History of chicken pox   . History of gestational diabetes   . History of measles, mumps, or rubella   . No pertinent past medical history     Past Surgical History:  Procedure Laterality Date  . BREAST CYST EXCISION  2006  . CESAREAN SECTION    . TUBAL LIGATION      There were no vitals filed for this visit.   Subjective Assessment - 11/21/19 0855    Subjective Patient reports pain in the neck.  She reports that she will see MD next week.  She will be discussing a return to work.    Currently in Pain? Yes    Pain Score 2     Pain Location Neck    Pain Orientation Right    Pain Descriptors / Indicators Sore;Tightness;Spasm    Aggravating Factors  activity                             [facility] Adult PT Treatment/Exercise - 11/21/19 0001      Neck Exercises: Machines for Strengthening   UBE (Upper Arm Bike) L5 3 min fwd/3 min bkwd    Cybex Row 20# 2x10    Cybex Chest Press 10# 2x10    Lat Pull 20# 2x10      Neck Exercises: Standing   Other Standing Exercises I's, Y's and T's 2# facing wall    Other Standing Exercises W backs      Shoulder Exercises: Stretch   Cross Chest Stretch 3 reps;10 seconds    Other Shoulder Stretches median nerve stretch      Manual Therapy    Manual Therapy Soft tissue mobilization;Passive ROM;Neural Stretch    Soft tissue mobilization STM to B UT and suboccipitals, added some STM to the pectorals just below the clavicle    Passive ROM cervical PROM all motions    Neural Stretch median nerve glides, some assist in ulnar glides                    PT Short Term Goals - 10/27/19 0947      PT SHORT TERM GOAL #1   Title independent with initial HEP    Time 2    Period Weeks    Status Achieved             PT Long Term Goals - 11/21/19 3875      PT LONG TERM GOAL #1   Title Improve posture and alignment with patient to demonstrate improve upright posture    Status Partially Met      PT LONG TERM GOAL #2   Title  increase cervical ROM 25%    Status Partially Met      PT LONG TERM GOAL #3   Title understand posture and body mechanics for ADL's and work    Status Partially Met      PT LONG TERM GOAL #4   Title decrease pain 50% with lifting    Status On-going                 Plan - 11/21/19 0934    Clinical Impression Statement Patient is in pain a 2/10 prior to starting as she went on the UBE pain increased to a 4/10 after all exercises pain was up to 6/10.  She is very tight in the ulnar nerve and the chest area, we worked on stretching this.  I spoke with her some on moving and stretching at home to try to get used to the ADL's and a return to work    PT Next Visit Plan do job simulation and education on safety with body mechanics for all patient care activities that she would be doing    Consulted and Agree with Plan of Care Patient           Patient will benefit from skilled therapeutic intervention in order to improve the following deficits and impairments:  Pain, Improper body mechanics, Increased muscle spasms, Postural dysfunction, Decreased range of motion, Decreased strength, Impaired flexibility, Decreased activity tolerance, Cardiopulmonary status limiting activity, Decreased  endurance  Visit Diagnosis: Abnormal posture  Cervicalgia  Chronic right shoulder pain  Other muscle spasm  Stiffness of right shoulder, not elsewhere classified     Problem List Patient Active Problem List   Diagnosis Date Noted  . DDD (degenerative disc disease), cervical 04/05/2019  . Palpitations 08/03/2016  . Common migraine with intractable migraine 06/19/2016  . Vasovagal near syncope 06/18/2016  . Pre-syncope 06/18/2016  . Dizziness 04/08/2012  . Anemia associated with acute blood loss--due to adhesions and C/S 11/14/2011  . Status post repeat low transverse cesarean section and BTL 11/11/2011  . Menstrual cycle disorder 07/09/2011  . Gestational diabetes mellitus in pregnancy 07/09/2011    Sumner Boast., PT 11/21/2019, 9:37 AM  [place], [place], [address] Phone: [phone]   Fax:  [phone]  Name: [name] MRN: [id number] Date of Birth: [birth year]

## 2019-11-28 ENCOUNTER — Other Ambulatory Visit: Payer: Self-pay

## 2019-11-28 ENCOUNTER — Encounter: Payer: Self-pay | Admitting: Physical Therapy

## 2019-11-28 ENCOUNTER — Ambulatory Visit: Payer: 59 | Attending: Neurosurgery | Admitting: Physical Therapy

## 2019-11-28 DIAGNOSIS — M62838 Other muscle spasm: Secondary | ICD-10-CM | POA: Insufficient documentation

## 2019-11-28 DIAGNOSIS — M25511 Pain in right shoulder: Secondary | ICD-10-CM | POA: Insufficient documentation

## 2019-11-28 DIAGNOSIS — M25611 Stiffness of right shoulder, not elsewhere classified: Secondary | ICD-10-CM | POA: Insufficient documentation

## 2019-11-28 DIAGNOSIS — G8929 Other chronic pain: Secondary | ICD-10-CM | POA: Insufficient documentation

## 2019-11-28 DIAGNOSIS — R29898 Other symptoms and signs involving the musculoskeletal system: Secondary | ICD-10-CM | POA: Insufficient documentation

## 2019-11-28 DIAGNOSIS — R293 Abnormal posture: Secondary | ICD-10-CM | POA: Insufficient documentation

## 2019-11-28 DIAGNOSIS — M542 Cervicalgia: Secondary | ICD-10-CM | POA: Insufficient documentation

## 2019-11-28 NOTE — Therapy (Signed)
[place], [place], [address] Phone: [phone]   Fax:  [phone]  Physical Therapy Treatment  Patient Details  Name: [name] MRN: [id number] Date of Birth: [birth year] Referring Provider (PT): Rico Ala Date: 11/28/2019   PT End of Session - 11/28/19 0854    Visit Number 7    Date for PT Re-Evaluation 12/18/19    PT Start Time 0837    PT Stop Time 0922    PT Time Calculation (min) 45 min    Activity Tolerance Patient tolerated treatment well    Behavior During Therapy [facility] for tasks assessed/performed           Past Medical History:  Diagnosis Date  . History of chicken pox   . History of gestational diabetes   . History of measles, mumps, or rubella   . No pertinent past medical history     Past Surgical History:  Procedure Laterality Date  . BREAST CYST EXCISION  2006  . CESAREAN SECTION    . TUBAL LIGATION      There were no vitals filed for this visit.   Subjective Assessment - 11/28/19 0838    Subjective Patient reports that she thinks that she has slept wrong, waking up the past few days with more neck pain.    Currently in Pain? Yes    Pain Score 3     Pain Location Neck    Pain Orientation Right    Pain Descriptors / Indicators Sore;Spasm;Tightness    Aggravating Factors  activity, sleep wrong    Pain Relieving Factors rest, stand up and move around                             [facility] Adult PT Treatment/Exercise - 11/28/19 0001      Therapeutic Activites    Therapeutic Activities Work Simulation    Work Marketing executive of lifting, moving patients up in bed, rolling patients, transferring patients and the body mechanics and posture for these jobs      Neck Exercises: Machines for [place] (Upper Arm Bike) L5 3 min fwd/3 min bkwd    Cybex Row 20# 2x10    Lat Pull 20# 2x10    Other Machines for  Strengthening triceps 20#, biceps 10# 2x10, 5# straight arm pulls with cues for posture and core, these seem to cause the "pressure below the collar bones.      Manual Therapy   Manual Therapy Neural Stretch    Soft tissue mobilization --    Passive ROM --    Neural Stretch median nerve glides, some assist in ulnar glides                    PT Short Term Goals - 10/27/19 0947      PT SHORT TERM GOAL #1   Title independent with initial HEP    Time 2    Period Weeks    Status Achieved             PT Long Term Goals - 11/28/19 4315      PT LONG TERM GOAL #1   Title Improve posture and alignment with patient to demonstrate improve upright posture    Status Achieved      PT LONG TERM GOAL #2   Title increase cervical ROM 25%    Status  Partially Met      PT LONG TERM GOAL #3   Title understand posture and body mechanics for ADL's and work    Status Achieved      PT LONG TERM GOAL #4   Title decrease pain 50% with lifting    Status Partially Met      PT LONG TERM GOAL #5   Title report no difficulty getting dressed and doing hair    Status Partially Met                 Plan - 11/28/19 0855    Clinical Impression Statement Patient typically will have pain in the right neck and upper trap area rated a 2-3/10, after we start exercises she will c/o pressure in the chest area, and seems to be mostly the pectoral area just below the clavicles. The pain will increased to about 5-6/10 with the activity.  She is very tight in the Ulnar nerve and tight with the median nerve.  She went back to work some last month about 4 hours a day at the end of the shift the pain would be up to 9/10.  My hope is that this is just her body getting used to the activity tolerance and becoming stronger.    PT Next Visit Plan I feel that she could do most of the exercises and stretches on her own without PT, PT could continue to address the tightness and spasms if needed.    Consulted  and Agree with Plan of Care Patient           Patient will benefit from skilled therapeutic intervention in order to improve the following deficits and impairments:  Pain, Improper body mechanics, Increased muscle spasms, Postural dysfunction, Decreased range of motion, Decreased strength, Impaired flexibility, Decreased activity tolerance, Cardiopulmonary status limiting activity, Decreased endurance  Visit Diagnosis: Abnormal posture  Cervicalgia  Chronic right shoulder pain  Other muscle spasm  Stiffness of right shoulder, not elsewhere classified     Problem List Patient Active Problem List   Diagnosis Date Noted  . DDD (degenerative disc disease), cervical 04/05/2019  . Palpitations 08/03/2016  . Common migraine with intractable migraine 06/19/2016  . Vasovagal near syncope 06/18/2016  . Pre-syncope 06/18/2016  . Dizziness 04/08/2012  . Anemia associated with acute blood loss--due to adhesions and C/S 11/14/2011  . Status post repeat low transverse cesarean section and BTL 11/11/2011  . Menstrual cycle disorder 07/09/2011  . Gestational diabetes mellitus in pregnancy 07/09/2011    Sumner Boast., PT 11/28/2019, 9:10 AM  [place], [place], [address] Phone: [phone]   Fax:  [phone]  Name: [name] MRN: [id number] Date of Birth: 1976-01-29

## 2019-11-30 DIAGNOSIS — M542 Cervicalgia: Secondary | ICD-10-CM | POA: Diagnosis not present

## 2019-12-04 ENCOUNTER — Other Ambulatory Visit: Payer: Self-pay | Admitting: Neurosurgery

## 2019-12-04 DIAGNOSIS — M542 Cervicalgia: Secondary | ICD-10-CM

## 2019-12-05 ENCOUNTER — Encounter: Payer: Self-pay | Admitting: Physical Therapy

## 2019-12-05 ENCOUNTER — Other Ambulatory Visit: Payer: Self-pay

## 2019-12-05 ENCOUNTER — Ambulatory Visit: Payer: 59 | Admitting: Physical Therapy

## 2019-12-05 DIAGNOSIS — M62838 Other muscle spasm: Secondary | ICD-10-CM

## 2019-12-05 DIAGNOSIS — M25511 Pain in right shoulder: Secondary | ICD-10-CM | POA: Diagnosis not present

## 2019-12-05 DIAGNOSIS — R293 Abnormal posture: Secondary | ICD-10-CM

## 2019-12-05 DIAGNOSIS — M542 Cervicalgia: Secondary | ICD-10-CM | POA: Diagnosis not present

## 2019-12-05 DIAGNOSIS — R29898 Other symptoms and signs involving the musculoskeletal system: Secondary | ICD-10-CM | POA: Diagnosis not present

## 2019-12-05 DIAGNOSIS — M25611 Stiffness of right shoulder, not elsewhere classified: Secondary | ICD-10-CM

## 2019-12-05 DIAGNOSIS — G8929 Other chronic pain: Secondary | ICD-10-CM | POA: Diagnosis not present

## 2019-12-05 NOTE — Therapy (Signed)
[place], [place], [address] Phone: [phone]   Fax:  [phone]  Physical Therapy Treatment  Patient Details  Name: [name] MRN: [id number] Date of Birth: 08-19-1975 Referring Provider (PT): Saintclair Halsted   Encounter Date: 12/05/2019   PT End of Session - 12/05/19 0918    Visit Number 8    Number of Visits 12    Date for PT Re-Evaluation 12/18/19    PT Start Time 0841    PT Stop Time 0921    PT Time Calculation (min) 40 min    Activity Tolerance Patient tolerated treatment well    Behavior During Therapy [facility] for tasks assessed/performed           Past Medical History:  Diagnosis Date   History of chicken pox    History of gestational diabetes    History of measles, mumps, or rubella    No pertinent past medical history     Past Surgical History:  Procedure Laterality Date   BREAST CYST EXCISION  2006   CESAREAN SECTION     TUBAL LIGATION      There were no vitals filed for this visit.   Subjective Assessment - 12/05/19 0845    Subjective Patient saw the MD and he is going to have her have a CT scan on 9/23, and NCVT on October 18.  She is out of work until at least that time, he has recommended continuing with PT    Currently in Pain? Yes    Pain Location Neck    Pain Orientation Right    Aggravating Factors  activity                             [facility] Adult PT Treatment/Exercise - 12/05/19 0001      Neck Exercises: Machines for Strengthening   UBE (Upper Arm Bike) L5 3 min fwd/3 min bkwd    Cybex Row 25# 2x10    Cybex Chest Press 5# 2x10    Lat Pull 25# 2x10    Other Machines for Strengthening triceps 20#, biceps 10# 2x10, 5# straight arm pulls with cues for posture and core      Neck Exercises: Theraband   Shoulder External Rotation 20 reps;Red      Neck Exercises: Standing   Other Standing Exercises I's, Y's and T's 2# facing wall, wand  behind back with PT over pressure for exteniosn stretch    Other Standing Exercises W backs, 4# shrugs with upper trap and levator stretches      Neck Exercises: Supine   Cervical Isometrics Right lateral flexion;Left lateral flexion;3 secs;5 reps    Neck Retraction 10 reps;3 secs    Other Supine Exercise W's with PT over pressure to get stretch      Shoulder Exercises: Stretch   Other Shoulder Stretches corner stretch      Manual Therapy   Manual Therapy Neural Stretch    Passive ROM cervical PROM all motions, gentle    Neural Stretch median nerve glides, some assist in ulnar glides                    PT Short Term Goals - 10/27/19 0947      PT SHORT TERM GOAL #1   Title independent with initial HEP    Time 2    Period Weeks    Status Achieved  PT Long Term Goals - 12/05/19 0923      PT LONG TERM GOAL #2   Title increase cervical ROM 25%    Status Partially Met                 Plan - 12/05/19 0919    Clinical Impression Statement Md has a scheduled CT scan next week and then will have a NCVT in October, the MD has advised [place] to continue with PT for strengthening and ROM, she has a visit limit and we did use PT visits trying to avoid surgery, we have advised her to call her insurance to see if we can get more visits.  I feel like her neural tension is improving but still very tight, she did have obvious shaking and weakness with some of the weighted exercises    PT Next Visit Plan working on strength and ROM to help her fucntion and decrease pain    Consulted and Agree with Plan of Care Patient           Patient will benefit from skilled therapeutic intervention in order to improve the following deficits and impairments:  Pain, Improper body mechanics, Increased muscle spasms, Postural dysfunction, Decreased range of motion, Decreased strength, Impaired flexibility, Decreased activity tolerance, Cardiopulmonary status limiting activity,  Decreased endurance  Visit Diagnosis: Abnormal posture  Cervicalgia  Chronic right shoulder pain  Other muscle spasm  Stiffness of right shoulder, not elsewhere classified     Problem List Patient Active Problem List   Diagnosis Date Noted   DDD (degenerative disc disease), cervical 04/05/2019   Palpitations 08/03/2016   Common migraine with intractable migraine 06/19/2016   Vasovagal near syncope 06/18/2016   Pre-syncope 06/18/2016   Dizziness 04/08/2012   Anemia associated with acute blood loss--due to adhesions and C/S 11/14/2011   Status post repeat low transverse cesarean section and BTL 11/11/2011   Menstrual cycle disorder 07/09/2011   Gestational diabetes mellitus in pregnancy 07/09/2011    Sumner Boast., PT 12/05/2019, 9:23 AM  [place], [place], [address] Phone: [phone]   Fax:  [phone]  Name: [name] MRN: [id number] Date of Birth: 1975/07/15

## 2019-12-08 ENCOUNTER — Other Ambulatory Visit: Payer: 59

## 2019-12-11 ENCOUNTER — Ambulatory Visit: Payer: 59 | Admitting: Family Medicine

## 2019-12-12 ENCOUNTER — Other Ambulatory Visit (HOSPITAL_BASED_OUTPATIENT_CLINIC_OR_DEPARTMENT_OTHER): Payer: Self-pay

## 2019-12-12 DIAGNOSIS — Z1231 Encounter for screening mammogram for malignant neoplasm of breast: Secondary | ICD-10-CM

## 2019-12-13 ENCOUNTER — Ambulatory Visit: Payer: 59 | Admitting: Physical Therapy

## 2019-12-13 ENCOUNTER — Encounter: Payer: Self-pay | Admitting: Physical Therapy

## 2019-12-13 ENCOUNTER — Encounter: Payer: 59 | Admitting: Physical Therapy

## 2019-12-13 ENCOUNTER — Other Ambulatory Visit: Payer: Self-pay

## 2019-12-13 DIAGNOSIS — M62838 Other muscle spasm: Secondary | ICD-10-CM

## 2019-12-13 DIAGNOSIS — M25611 Stiffness of right shoulder, not elsewhere classified: Secondary | ICD-10-CM

## 2019-12-13 DIAGNOSIS — M542 Cervicalgia: Secondary | ICD-10-CM

## 2019-12-13 DIAGNOSIS — R293 Abnormal posture: Secondary | ICD-10-CM | POA: Diagnosis not present

## 2019-12-13 DIAGNOSIS — R29898 Other symptoms and signs involving the musculoskeletal system: Secondary | ICD-10-CM | POA: Diagnosis not present

## 2019-12-13 DIAGNOSIS — G8929 Other chronic pain: Secondary | ICD-10-CM | POA: Diagnosis not present

## 2019-12-13 DIAGNOSIS — M25511 Pain in right shoulder: Secondary | ICD-10-CM | POA: Diagnosis not present

## 2019-12-13 NOTE — Therapy (Signed)
[name], [place], [address] Phone: [phone]   Fax:  [phone]  Physical Therapy Treatment  Patient Details  Name: [name] MRN: [id number] Date of Birth: [birth year] Referring Provider (PT): Saintclair Halsted   Encounter Date: 12/13/2019   PT End of Session - 12/13/19 0843    Visit Number 9    Number of Visits 12    Date for PT Re-Evaluation 12/18/19    PT Start Time 0800    PT Stop Time 0839    PT Time Calculation (min) 39 min    Activity Tolerance Patient tolerated treatment well    Behavior During Therapy [name] for tasks assessed/performed           Past Medical History:  Diagnosis Date   History of chicken pox    History of gestational diabetes    History of measles, mumps, or rubella    No pertinent past medical history     Past Surgical History:  Procedure Laterality Date   BREAST CYST EXCISION  2006   CESAREAN SECTION     TUBAL LIGATION      There were no vitals filed for this visit.   Subjective Assessment - 12/13/19 0805    Subjective Pt states she is having a little more pain this morning; needs to leave a few minutes early to make it to dental appt.    Currently in Pain? Yes    Pain Score 4     Pain Location Neck    Pain Orientation Right                             [facility] Adult PT Treatment/Exercise - 12/13/19 0001      Neck Exercises: Machines for Strengthening   UBE (Upper Arm Bike) L5 3 min fwd/3 min bkwd    Cybex Row 25# 2x10    Cybex Chest Press 5# 2x10    Lat Pull 25# 2x10    Other Machines for Strengthening triceps 20#, biceps 10# 2x10, 5# straight arm pulls with cues for posture and core      Neck Exercises: Theraband   Shoulder External Rotation 20 reps;Green    Shoulder Internal Rotation 20 reps;Green      Neck Exercises: Standing   Other Standing Exercises I's, Y's and T's 2# facing wall, wand behind back with PT over pressure for  exteniosn stretch, yellow ball overhead raise 2x10    Other Standing Exercises W backs, 4# shrugs with upper trap and levator stretches, red TB scap stab 3 ways x5 B                    PT Short Term Goals - 10/27/19 0947      PT SHORT TERM GOAL #1   Title independent with initial HEP    Time 2    Period Weeks    Status Achieved             PT Long Term Goals - 12/05/19 0923      PT LONG TERM GOAL #2   Title increase cervical ROM 25%    Status Partially Met                 Plan - 12/13/19 0843    Clinical Impression Statement Pt needed to leave a few minutes early d/t dentist appt. Pt did well with ex's today. Standing shoulder ext and  pec stretching causes pressure sensation. Pt has CT scan scheduled for tomorrow, 9/23. Postural cues needed for standing ex's.    PT Treatment/Interventions ADLs/Self Care Home Management;Electrical Stimulation;Moist Heat;Ultrasound;Therapeutic activities;Therapeutic exercise;Manual techniques;Dry needling;Patient/family education;Cryotherapy    PT Next Visit Plan working on strength and ROM to help her fucntion and decrease pain    Consulted and Agree with Plan of Care Patient           Patient will benefit from skilled therapeutic intervention in order to improve the following deficits and impairments:  Pain, Improper body mechanics, Increased muscle spasms, Postural dysfunction, Decreased range of motion, Decreased strength, Impaired flexibility, Decreased activity tolerance, Cardiopulmonary status limiting activity, Decreased endurance  Visit Diagnosis: Abnormal posture  Cervicalgia  Chronic right shoulder pain  Other muscle spasm  Stiffness of right shoulder, not elsewhere classified     Problem List Patient Active Problem List   Diagnosis Date Noted   DDD (degenerative disc disease), cervical 04/05/2019   Palpitations 08/03/2016   Common migraine with intractable migraine 06/19/2016   Vasovagal near  syncope 06/18/2016   Pre-syncope 06/18/2016   Dizziness 04/08/2012   Anemia associated with acute blood loss--due to adhesions and C/S 11/14/2011   Status post repeat low transverse cesarean section and BTL 11/11/2011   Menstrual cycle disorder 07/09/2011   Gestational diabetes mellitus in pregnancy 07/09/2011   [name], PT, DPT [name] [name] 12/13/2019, 8:46 AM  [name], [place], [address] Phone: [phone]   Fax:  [phone]  Name: [name] MRN: [id number] Date of Birth: [birth year]

## 2019-12-14 ENCOUNTER — Ambulatory Visit
Admission: RE | Admit: 2019-12-14 | Discharge: 2019-12-14 | Disposition: A | Discharge status: Home / Self Care | Payer: 59 | Source: Ambulatory Visit | Attending: Neurosurgery | Admitting: Neurosurgery

## 2019-12-14 DIAGNOSIS — M4322 Fusion of spine, cervical region: Secondary | ICD-10-CM | POA: Diagnosis not present

## 2019-12-14 DIAGNOSIS — M542 Cervicalgia: Secondary | ICD-10-CM

## 2019-12-19 ENCOUNTER — Other Ambulatory Visit: Payer: Self-pay

## 2019-12-19 ENCOUNTER — Encounter: Payer: Self-pay | Admitting: Physical Therapy

## 2019-12-19 ENCOUNTER — Ambulatory Visit: Payer: 59 | Admitting: Physical Therapy

## 2019-12-19 DIAGNOSIS — M62838 Other muscle spasm: Secondary | ICD-10-CM

## 2019-12-19 DIAGNOSIS — R293 Abnormal posture: Secondary | ICD-10-CM | POA: Diagnosis not present

## 2019-12-19 DIAGNOSIS — G8929 Other chronic pain: Secondary | ICD-10-CM

## 2019-12-19 DIAGNOSIS — R29898 Other symptoms and signs involving the musculoskeletal system: Secondary | ICD-10-CM

## 2019-12-19 DIAGNOSIS — M25611 Stiffness of right shoulder, not elsewhere classified: Secondary | ICD-10-CM

## 2019-12-19 DIAGNOSIS — M542 Cervicalgia: Secondary | ICD-10-CM | POA: Diagnosis not present

## 2019-12-19 DIAGNOSIS — M25511 Pain in right shoulder: Secondary | ICD-10-CM | POA: Diagnosis not present

## 2019-12-19 NOTE — Therapy (Signed)
[place]. [place], [place], [address] Phone: [phone]   Fax:  [phone]  Physical Therapy Treatment  Patient Details  Name: [name] MRN: [id number] Date of Birth: 1975/06/10 Referring Provider (PT): Rico Ala Date: 12/19/2019   PT End of Session - 12/19/19 0835    Visit Number 10    Date for PT Re-Evaluation 01/18/20    PT Start Time 0756    PT Stop Time 0835    PT Time Calculation (min) 39 min    Activity Tolerance Patient tolerated treatment well    Behavior During Therapy [facility] for tasks assessed/performed           Past Medical History:  Diagnosis Date  . History of chicken pox   . History of gestational diabetes   . History of measles, mumps, or rubella   . No pertinent past medical history     Past Surgical History:  Procedure Laterality Date  . BREAST CYST EXCISION  2006  . CESAREAN SECTION    . TUBAL LIGATION      There were no vitals filed for this visit.   Subjective Assessment - 12/19/19 0802    Subjective Patient is out of work until November 8th.  She is looking at how to return to work, reports lifting increases pain    Currently in Pain? Yes    Pain Score 4     Pain Location Neck    Pain Orientation Right    Aggravating Factors  lifting              [facility] PT Assessment - 12/19/19 0001      AROM   Right Shoulder Flexion 150 Degrees    Right Shoulder Internal Rotation 60 Degrees    Right Shoulder External Rotation 70 Degrees                         [facility] Adult PT Treatment/Exercise - 12/19/19 0001      Neck Exercises: Machines for Strengthening   UBE (Upper Arm Bike) L5 3 min fwd/3 min bkwd    Cybex Row 25# 2x10    Cybex Chest Press 5# 2x10    Lat Pull 25# 2x10      Neck Exercises: Standing   Other Standing Exercises I's, Y's and T's 2# facing wall and away from ball, wand behind back with PT over pressure for exteniosn stretch,  yellow ball overhead raise 2x10      Neck Exercises: Seated   Cervical Isometrics Flexion;Extension;Right lateral flexion;Left lateral flexion;Right rotation;Left rotation;3 secs;5 reps    X to V 10 reps    Other Seated Exercise bent over row, reverse flies and extensions 2#      Neck Exercises: Supine   Other Supine Exercise W's with PT over pressure to get stretch      Manual Therapy   Manual Therapy Neural Stretch    Passive ROM cervical PROM all motions, gentle    Neural Stretch median nerve glides, some assist in ulnar glides                    PT Short Term Goals - 10/27/19 0947      PT SHORT TERM GOAL #1   Title independent with initial HEP    Time 2    Period Weeks    Status Achieved  PT Long Term Goals - 12/19/19 0842      PT LONG TERM GOAL #1   Title Improve posture and alignment with patient to demonstrate improve upright posture    Status Achieved      PT LONG TERM GOAL #2   Title increase cervical ROM 25%    Status Partially Met      PT LONG TERM GOAL #3   Title understand posture and body mechanics for ADL's and work    Status Achieved      PT LONG TERM GOAL #4   Title decrease pain 50% with lifting    Status Partially Met      PT LONG TERM GOAL #5   Title report no difficulty getting dressed and doing hair                 Plan - 12/19/19 0836    Clinical Impression Statement C/T scan shows "developing arthrosis".  She will have NCVT on 01/08/20.  She is still having some weakness, she is tight in the upper extremities and the neck.  She c/o some tightness in the left UE more than the right today, She does fatigue with exercises.  She has increased ROM of the shuolder.  She is thinking about changes jobs and going to one that is mostly sitting at a computer, I did caution her with this and told her that this could be very tugh on her and she may need to really watch her posture and take frequent stretch breaks    PT Next  Visit Plan She reports that she called her insurance and was assured that her insurance is medical necessity.  We will continue to work with her to try to assure her function with less pain    Consulted and Agree with Plan of Care Patient           Patient will benefit from skilled therapeutic intervention in order to improve the following deficits and impairments:  Pain, Improper body mechanics, Increased muscle spasms, Postural dysfunction, Decreased range of motion, Decreased strength, Impaired flexibility, Decreased activity tolerance, Cardiopulmonary status limiting activity, Decreased endurance  Visit Diagnosis: Abnormal posture - Plan: PT plan of care cert/re-cert  Cervicalgia - Plan: PT plan of care cert/re-cert  Chronic right shoulder pain - Plan: PT plan of care cert/re-cert  Other muscle spasm - Plan: PT plan of care cert/re-cert  Stiffness of right shoulder, not elsewhere classified - Plan: PT plan of care cert/re-cert  Other symptoms and signs involving the musculoskeletal system - Plan: PT plan of care cert/re-cert     Problem List Patient Active Problem List   Diagnosis Date Noted  . DDD (degenerative disc disease), cervical 04/05/2019  . Palpitations 08/03/2016  . Common migraine with intractable migraine 06/19/2016  . Vasovagal near syncope 06/18/2016  . Pre-syncope 06/18/2016  . Dizziness 04/08/2012  . Anemia associated with acute blood loss--due to adhesions and C/S 11/14/2011  . Status post repeat low transverse cesarean section and BTL 11/11/2011  . Menstrual cycle disorder 07/09/2011  . Gestational diabetes mellitus in pregnancy 07/09/2011    Sumner Boast., PT 12/19/2019, 8:44 AM  [place]. [place], [place], [address] Phone: [phone]   Fax:  [phone]  Name: [name] MRN: [id number] Date of Birth: [birth year]

## 2019-12-26 ENCOUNTER — Other Ambulatory Visit: Payer: Self-pay

## 2019-12-26 ENCOUNTER — Ambulatory Visit: Payer: 59 | Attending: Neurosurgery | Admitting: Physical Therapy

## 2019-12-26 ENCOUNTER — Encounter: Payer: Self-pay | Admitting: Physical Therapy

## 2019-12-26 DIAGNOSIS — M25611 Stiffness of right shoulder, not elsewhere classified: Secondary | ICD-10-CM | POA: Insufficient documentation

## 2019-12-26 DIAGNOSIS — R293 Abnormal posture: Secondary | ICD-10-CM | POA: Insufficient documentation

## 2019-12-26 DIAGNOSIS — M542 Cervicalgia: Secondary | ICD-10-CM | POA: Diagnosis not present

## 2019-12-26 DIAGNOSIS — M62838 Other muscle spasm: Secondary | ICD-10-CM | POA: Insufficient documentation

## 2019-12-26 DIAGNOSIS — G8929 Other chronic pain: Secondary | ICD-10-CM | POA: Insufficient documentation

## 2019-12-26 DIAGNOSIS — M25511 Pain in right shoulder: Secondary | ICD-10-CM | POA: Diagnosis not present

## 2019-12-26 NOTE — Therapy (Signed)
[place]. [place], [place], [address] Phone: [phone]   Fax:  [phone]  Physical Therapy Treatment  Patient Details  Name: [name] MRN: [id number] Date of Birth: 05-Aug-1975 Referring Provider (PT): Rico Ala Date: 12/26/2019   PT End of Session - 12/26/19 1052    Visit Number 11    Date for PT Re-Evaluation 01/18/20    PT Start Time 1008    PT Stop Time 1053    PT Time Calculation (min) 45 min    Activity Tolerance Patient tolerated treatment well    Behavior During Therapy [facility] for tasks assessed/performed           Past Medical History:  Diagnosis Date  . History of chicken pox   . History of gestational diabetes   . History of measles, mumps, or rubella   . No pertinent past medical history     Past Surgical History:  Procedure Laterality Date  . BREAST CYST EXCISION  2006  . CESAREAN SECTION    . TUBAL LIGATION      There were no vitals filed for this visit.   Subjective Assessment - 12/26/19 1012    Subjective Patietn reports that she still has pain but feels like it is a little better, fatigue continues to be an issue with her home ADL's    Currently in Pain? Yes    Pain Score 3     Pain Location Neck    Pain Orientation Right    Aggravating Factors  ADL's                             [facility] Adult PT Treatment/Exercise - 12/26/19 0001      Neck Exercises: Machines for Strengthening   UBE (Upper Arm Bike) L6 3 min fwd/3 min bkwd    Cybex Row 25# 2x10    Cybex Chest Press 5# 3x10    Lat Pull 25# 3x10    Other Machines for Strengthening triceps 20#, biceps 10# 3x10, 5# straight arm pulls with cues for posture and core      Neck Exercises: Theraband   Shoulder External Rotation 20 reps;Green      Neck Exercises: Standing   Other Standing Exercises walking 2 laps outside with 2# biceps and over head presses alternating     Other Standing  Exercises W baxks with 2#, shrugs with upper trap and levator stretches 5#      Neck Exercises: Seated   Other Seated Exercise shoulder and neck stretches passively by PT                    PT Short Term Goals - 10/27/19 0947      PT SHORT TERM GOAL #1   Title independent with initial HEP    Time 2    Period Weeks    Status Achieved             PT Long Term Goals - 12/26/19 1054      PT LONG TERM GOAL #1   Title Improve posture and alignment with patient to demonstrate improve upright posture    Status Achieved      PT LONG TERM GOAL #2   Title increase cervical ROM 25%    Status Partially Met      PT LONG TERM GOAL #3   Title understand posture and body mechanics for ADL's  and work    Status Achieved                 Plan - 12/26/19 1052    Clinical Impression Statement I incresaed some weight and reps, added walking with alternating biceps and overhead press.  Working on endurance and strength.  She continues to c/o fatigue and tightness in the right UE.    PT Next Visit Plan She reports that she called her insurance and was assured that her insurance is medical necessity.  We will continue to work with her to try to assure her function with less pain    Consulted and Agree with Plan of Care Patient           Patient will benefit from skilled therapeutic intervention in order to improve the following deficits and impairments:  Pain, Improper body mechanics, Increased muscle spasms, Postural dysfunction, Decreased range of motion, Decreased strength, Impaired flexibility, Decreased activity tolerance, Cardiopulmonary status limiting activity, Decreased endurance  Visit Diagnosis: Abnormal posture  Cervicalgia  Chronic right shoulder pain  Other muscle spasm     Problem List Patient Active Problem List   Diagnosis Date Noted  . DDD (degenerative disc disease), cervical 04/05/2019  . Palpitations 08/03/2016  . Common migraine with  intractable migraine 06/19/2016  . Vasovagal near syncope 06/18/2016  . Pre-syncope 06/18/2016  . Dizziness 04/08/2012  . Anemia associated with acute blood loss--due to adhesions and C/S 11/14/2011  . Status post repeat low transverse cesarean section and BTL 11/11/2011  . Menstrual cycle disorder 07/09/2011  . Gestational diabetes mellitus in pregnancy 07/09/2011    Sumner Boast., PT 12/26/2019, 10:54 AM  [place]. [place], [place], [address] Phone: [phone]   Fax:  [phone]  Name: [name] MRN: [id number] Date of Birth: 1975/11/21

## 2020-01-04 ENCOUNTER — Ambulatory Visit: Payer: 59 | Admitting: Physical Therapy

## 2020-01-04 ENCOUNTER — Other Ambulatory Visit: Payer: Self-pay

## 2020-01-04 ENCOUNTER — Encounter: Payer: Self-pay | Admitting: Physical Therapy

## 2020-01-04 DIAGNOSIS — M542 Cervicalgia: Secondary | ICD-10-CM

## 2020-01-04 DIAGNOSIS — G8929 Other chronic pain: Secondary | ICD-10-CM | POA: Diagnosis not present

## 2020-01-04 DIAGNOSIS — M62838 Other muscle spasm: Secondary | ICD-10-CM | POA: Diagnosis not present

## 2020-01-04 DIAGNOSIS — R293 Abnormal posture: Secondary | ICD-10-CM | POA: Diagnosis not present

## 2020-01-04 DIAGNOSIS — M25511 Pain in right shoulder: Secondary | ICD-10-CM | POA: Diagnosis not present

## 2020-01-04 DIAGNOSIS — M25611 Stiffness of right shoulder, not elsewhere classified: Secondary | ICD-10-CM | POA: Diagnosis not present

## 2020-01-04 NOTE — Therapy (Signed)
[place]. [place], [place], [address] Phone: [phone]   Fax:  [phone]  Physical Therapy Treatment  Patient Details  Name: [name] MRN: [id number] Date of Birth: 10-Dec-1975 Referring Provider (PT): Rico Ala Date: 01/04/2020   PT End of Session - 01/04/20 1054    Visit Number 12    PT Start Time 9390    PT Stop Time 1055    PT Time Calculation (min) 41 min    Activity Tolerance Patient tolerated treatment well    Behavior During Therapy [facility] for tasks assessed/performed           Past Medical History:  Diagnosis Date  . History of chicken pox   . History of gestational diabetes   . History of measles, mumps, or rubella   . No pertinent past medical history     Past Surgical History:  Procedure Laterality Date  . BREAST CYST EXCISION  2006  . CESAREAN SECTION    . TUBAL LIGATION      There were no vitals filed for this visit.   Subjective Assessment - 01/04/20 1020    Subjective Pt reports still has same pain    Currently in Pain? Yes    Pain Score 2     Pain Location Neck    Pain Orientation Right                             [facility] Adult PT Treatment/Exercise - 01/04/20 0001      Neck Exercises: Machines for Strengthening   UBE (Upper Arm Bike) L6 3 min fwd/3 min bkwd    Cybex Row 25# 2x10    Cybex Chest Press 10# 3x10    Lat Pull 25# 3x10    Other Machines for Strengthening triceps 20#, biceps 10# 3x10, 5# straight arm pulls with cues for posture and core      Neck Exercises: Standing   Other Standing Exercises W baxks with 2#, shrugs with upper trap and levator stretches 5#      Shoulder Exercises: Standing   Other Standing Exercises yellow ball overhead 2x10; red scap stab 3 ways x5 B      Shoulder Exercises: Stretch   Corner Stretch 1 rep;30 seconds    Internal Rotation Stretch 30 seconds    Other Shoulder Stretches bicep stretch x30 sec  on R                    PT Short Term Goals - 10/27/19 0947      PT SHORT TERM GOAL #1   Title independent with initial HEP    Time 2    Period Weeks    Status Achieved             PT Long Term Goals - 12/26/19 1054      PT LONG TERM GOAL #1   Title Improve posture and alignment with patient to demonstrate improve upright posture    Status Achieved      PT LONG TERM GOAL #2   Title increase cervical ROM 25%    Status Partially Met      PT LONG TERM GOAL #3   Title understand posture and body mechanics for ADL's and work    Status Achieved                 Plan - 01/04/20 1054  Clinical Impression Statement Pt tolerated progression of weight with machine interventions well. Did report some increased pressure anteriorly and tightness in R UE with increased exercise. Pt states that pressure/pain is slightly better but still present with activity. Plan to discuss continuance of PT next rx.    PT Treatment/Interventions ADLs/Self Care Home Management;Electrical Stimulation;Moist Heat;Ultrasound;Therapeutic activities;Therapeutic exercise;Manual techniques;Dry needling;Patient/family education;Cryotherapy    PT Next Visit Plan We will continue to work with her to try to assure her function with less pain    Consulted and Agree with Plan of Care Patient           Patient will benefit from skilled therapeutic intervention in order to improve the following deficits and impairments:  Pain, Improper body mechanics, Increased muscle spasms, Postural dysfunction, Decreased range of motion, Decreased strength, Impaired flexibility, Decreased activity tolerance, Cardiopulmonary status limiting activity, Decreased endurance  Visit Diagnosis: Abnormal posture  Cervicalgia  Chronic right shoulder pain  Other muscle spasm  Stiffness of right shoulder, not elsewhere classified     Problem List Patient Active Problem List   Diagnosis Date Noted  . DDD  (degenerative disc disease), cervical 04/05/2019  . Palpitations 08/03/2016  . Common migraine with intractable migraine 06/19/2016  . Vasovagal near syncope 06/18/2016  . Pre-syncope 06/18/2016  . Dizziness 04/08/2012  . Anemia associated with acute blood loss--due to adhesions and C/S 11/14/2011  . Status post repeat low transverse cesarean section and BTL 11/11/2011  . Menstrual cycle disorder 07/09/2011  . Gestational diabetes mellitus in pregnancy 07/09/2011   [name], PT, DPT [name] [place] [place]ie 01/04/2020, 10:56 AM  [place]. [place], [place], [address] Phone: [phone]   Fax:  [phone]  Name: [name] MRN: [id number] Date of Birth: September 07, 1975

## 2020-01-08 DIAGNOSIS — M79602 Pain in left arm: Secondary | ICD-10-CM | POA: Diagnosis not present

## 2020-01-08 DIAGNOSIS — M79601 Pain in right arm: Secondary | ICD-10-CM | POA: Diagnosis not present

## 2020-01-08 DIAGNOSIS — M542 Cervicalgia: Secondary | ICD-10-CM | POA: Diagnosis not present

## 2020-01-09 ENCOUNTER — Ambulatory Visit (HOSPITAL_BASED_OUTPATIENT_CLINIC_OR_DEPARTMENT_OTHER): Payer: 59

## 2020-01-11 ENCOUNTER — Ambulatory Visit: Payer: 59 | Admitting: Physical Therapy

## 2020-01-15 ENCOUNTER — Encounter (HOSPITAL_BASED_OUTPATIENT_CLINIC_OR_DEPARTMENT_OTHER): Payer: 59

## 2020-01-16 DIAGNOSIS — R030 Elevated blood-pressure reading, without diagnosis of hypertension: Secondary | ICD-10-CM | POA: Diagnosis not present

## 2020-01-16 DIAGNOSIS — M542 Cervicalgia: Secondary | ICD-10-CM | POA: Diagnosis not present

## 2020-01-16 NOTE — Progress Notes (Addendum)
[place] [facility], [place], [place] [address] [phone]  Date:  01/18/2020   Name:  [name]   DOB:  12-Dec-1975   MRN:  [id number]  PCP:  Darreld Mclean, MD    Chief Complaint: Annual Exam   History of Present Illness:  [name] is a 44 y.o. very pleasant female patient who presents with the following:  Patient here today for physical exam- History of palpitations, gestational diabetes, migraine headache Last seen by myself about 1 year ago She has 3 school-aged children, ages 67, 103, 31; they are all doing well Their SIL was living with them- she had advanced alzheimer disease and died just recently- she knows that she is in a better place but this is still hard for the family Status post BTL  Recently she has been seeing neurosurgery, Dr.Cram for neck pain.  She had surgery in May - C5-7 fusion.   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  The plan is for her to return to full duty at the end of November.  She admits she is a bit worried about this, there can be quite a bit of lifting involved with her work  [employer]- will do today Flu vaccine- done already  Tetanus now due Hepatitis C screening Pap-due for update- however she is bleeding right now so will defer  Mammogram 1 year ago- she plans to do this in December  COVID-19 series done including booster   Patient Active Problem List   Diagnosis Date Noted  . DDD (degenerative disc disease), cervical 04/05/2019  . Palpitations 08/03/2016  . Common migraine with intractable migraine 06/19/2016  . Vasovagal near syncope 06/18/2016  . Pre-syncope 06/18/2016  . Dizziness 04/08/2012  . Anemia associated with acute blood loss--due to adhesions and C/S 11/14/2011  . Status post repeat low transverse cesarean section and BTL  11/11/2011  . Menstrual cycle disorder 07/09/2011  . Gestational diabetes mellitus in pregnancy 07/09/2011    Past Medical History:  Diagnosis Date  . History of chicken pox   . History of gestational diabetes   . History of measles, mumps, or rubella   . No pertinent past medical history     Past Surgical History:  Procedure Laterality Date  . BREAST CYST EXCISION  2006  . CESAREAN SECTION    . TUBAL LIGATION      Social History   Tobacco Use  . Smoking status: Never Smoker  . Smokeless tobacco: Never Used  Vaping Use  . Vaping Use: Never used  Substance Use Topics  . Alcohol use: Yes    Comment: Rarely- red wine  . Drug use: No    Family History  Problem Relation Age of Onset  . Cancer Mother        cervical  . Diabetes Mother   . Hypertension Mother   . Arthritis Other   . Diabetes Other   . Hyperlipidemia Other   . Hypertension Other   . Coronary artery disease Maternal Grandfather   . Asthma Brother   . Cancer Maternal Aunt        colon    No Known Allergies  Medication list has been reviewed and updated.  Current Outpatient Medications on File Prior to Visit  Medication Sig Dispense Refill  .  cyclobenzaprine (FLEXERIL) 10 MG tablet One half to one tab PO qHS, then increase gradually to one tab TID. 90 tablet 0  . ibuprofen (ADVIL) 200 MG tablet Take 200 mg by mouth every 6 (six) hours as needed.    . Nutritional Supplements (GRAPESEED EXTRACT PO) Take by mouth daily.    . vitamin C (ASCORBIC ACID) 500 MG tablet Take 500 mg by mouth daily.    . vitamin E 1000 UNIT capsule Take 1,000 Units by mouth daily.     No current facility-administered medications on file prior to visit.    Review of Systems:  As per HPI- otherwise negative.   Physical Examination: Vitals:   01/18/20 0856  BP: 118/72  Pulse: 79  Resp: 16  SpO2: 99%   Vitals:   01/18/20 0856  Weight: 134 lb (60.8 kg)  Height: 5\' 3"  (1.6 m)   Body mass index is 23.74  kg/m. Ideal Body Weight: Weight in (lb) to have BMI = 25: 140.8  GEN: no acute distress.  Normal weight, looks well HEENT: Atraumatic, Normocephalic.  Ears and Nose: No external deformity. CV: RRR, No M/G/R. No JVD. No thrill. No extra heart sounds. PULM: CTA B, no wheezes, crackles, rhonchi. No retractions. No resp. distress. No accessory muscle use. ABD: S, NT, ND, +BS. No rebound. No HSM. EXTR: No c/c/e PSYCH: Normally interactive. Conversant.    Assessment and Plan: Physical exam  Screening for hyperlipidemia - Plan: Lipid panel  Screening for deficiency anemia - Plan: CBC  Screening for diabetes mellitus - Plan: Comprehensive metabolic panel, Hemoglobin A1c  Screening for thyroid disorder - Plan: TSH  Encounter for vitamin deficiency screening - Plan: VITAMIN D 25 Hydroxy (Vit-D Deficiency, Fractures)  Encounter for hepatitis C screening test for low risk patient - Plan: Hepatitis C antibody  Elevated blood uric acid level - Plan: Uric acid  Immunization due - Plan: Td vaccine greater than or equal to 7yo preservative free IM  Chronic pain of both shoulders - Plan: diclofenac Sodium (VOLTAREN) 1 % GEL   Here today for physical exam.  I will be in touch with labs soon as possible.  Encouraged healthy diet and exercise routine, discussed health maintenance Patient today for physical exam Update tetanus Refill diclofenac which he uses as needed for shoulder pain. Asked her to come in for a Pap at her convenience This visit occurred during the SARS-CoV-2 public health emergency.  Safety protocols were in place, including screening questions prior to the visit, additional usage of staff PPE, and extensive cleaning of exam room while observing appropriate contact time as indicated for disinfecting solutions.    Signed Lamar Blinks, MD  Received her labs 10/29- message to pt  Results for orders placed or performed in visit on 01/18/20  CBC  Result Value Ref Range    WBC 8.4 3.8 - 10.8 Thousand/uL   RBC 4.15 3.80 - 5.10 Million/uL   Hemoglobin 12.6 11.7 - 15.5 g/dL   HCT 37.2 35 - 45 %   MCV 89.6 80.0 - 100.0 fL   MCH 30.4 27.0 - 33.0 pg   MCHC 33.9 32.0 - 36.0 g/dL   RDW 13.1 11.0 - 15.0 %   Platelets 261 140 - 400 Thousand/uL   MPV 10.9 7.5 - 12.5 fL  Comprehensive metabolic panel  Result Value Ref Range   Glucose, Bld 91 65 - 99 mg/dL   BUN 12 7 - 25 mg/dL   Creat 0.73 0.50 - 1.10 mg/dL   BUN/Creatinine  Ratio NOT APPLICABLE 6 - 22 (calc)   Sodium 137 135 - 146 mmol/L   Potassium 4.3 3.5 - 5.3 mmol/L   Chloride 106 98 - 110 mmol/L   CO2 24 20 - 32 mmol/L   Calcium 9.2 8.6 - 10.2 mg/dL   Total Protein 7.6 6.1 - 8.1 g/dL   Albumin 4.5 3.6 - 5.1 g/dL   Globulin 3.1 1.9 - 3.7 g/dL (calc)   AG Ratio 1.5 1.0 - 2.5 (calc)   Total Bilirubin 0.5 0.2 - 1.2 mg/dL   Alkaline phosphatase (APISO) 51 31 - 125 U/L   AST 11 10 - 30 U/L   ALT 10 6 - 29 U/L  Hemoglobin A1c  Result Value Ref Range   Hgb A1c MFr Bld 5.2 <5.7 % of total Hgb   Mean Plasma Glucose 103 (calc)   eAG (mmol/L) 5.7 (calc)  Lipid panel  Result Value Ref Range   Cholesterol 188 <200 mg/dL   HDL 47 (L) > OR = 50 mg/dL   Triglycerides 152 (H) <150 mg/dL   LDL Cholesterol (Calc) 114 (H) mg/dL (calc)   Total CHOL/HDL Ratio 4.0 <5.0 (calc)   Non-HDL Cholesterol (Calc) 141 (H) <130 mg/dL (calc)  TSH  Result Value Ref Range   TSH 1.60 mIU/L  VITAMIN D 25 Hydroxy (Vit-D Deficiency, Fractures)  Result Value Ref Range   Vit D, 25-Hydroxy 33 30 - 100 ng/mL  Uric acid  Result Value Ref Range   Uric Acid, Serum 6.9 2.5 - 7.0 mg/dL

## 2020-01-16 NOTE — Patient Instructions (Addendum)
It was great to see you again today, I will be in touch with your results as soon as possible Tetanus booster today Let me know when you would like to come in for your pap- we can work you in for this You can schedule your kids' covid through the [email or web address] website   Health Maintenance, Female Adopting a healthy lifestyle and getting preventive care are important in promoting health and wellness. Ask your health care provider about:  The right schedule for you to have regular tests and exams.  Things you can do on your own to prevent diseases and keep yourself healthy. What should I know about diet, weight, and exercise? Eat a healthy diet   Eat a diet that includes plenty of vegetables, fruits, low-fat dairy products, and lean protein.  Do not eat a lot of foods that are high in solid fats, added sugars, or sodium. Maintain a healthy weight Body mass index (BMI) is used to identify weight problems. It estimates body fat based on height and weight. Your health care provider can help determine your BMI and help you achieve or maintain a healthy weight. Get regular exercise Get regular exercise. This is one of the most important things you can do for your health. Most adults should:  Exercise for at least 150 minutes each week. The exercise should increase your heart rate and make you sweat (moderate-intensity exercise).  Do strengthening exercises at least twice a week. This is in addition to the moderate-intensity exercise.  Spend less time sitting. Even light physical activity can be beneficial. Watch cholesterol and blood lipids Have your blood tested for lipids and cholesterol at 43 years of age, then have this test every 5 years. Have your cholesterol levels checked more often if:  Your lipid or cholesterol levels are high.  You are older than 43 years of age.  You are at high risk for heart disease. What should I know about cancer screening? Depending on your health  history and family history, you may need to have cancer screening at various ages. This may include screening for:  Breast cancer.  Cervical cancer.  Colorectal cancer.  Skin cancer.  Lung cancer. What should I know about heart disease, diabetes, and high blood pressure? Blood pressure and heart disease  High blood pressure causes heart disease and increases the risk of stroke. This is more likely to develop in people who have high blood pressure readings, are of African descent, or are overweight.  Have your blood pressure checked: ? Every 3-5 years if you are 36-74 years of age. ? Every year if you are 13 years old or older. Diabetes Have regular diabetes screenings. This checks your fasting blood sugar level. Have the screening done:  Once every three years after age 64 if you are at a normal weight and have a low risk for diabetes.  More often and at a younger age if you are overweight or have a high risk for diabetes. What should I know about preventing infection? Hepatitis B If you have a higher risk for hepatitis B, you should be screened for this virus. Talk with your health care provider to find out if you are at risk for hepatitis B infection. Hepatitis C Testing is recommended for:  Everyone born from [birth year] through 1964.  Anyone with known risk factors for hepatitis C. Sexually transmitted infections (STIs)  Get screened for STIs, including gonorrhea and chlamydia, if: ? You are sexually active and are younger than 43 years  of age. ? You are older than 43 years of age and your health care provider tells you that you are at risk for this type of infection. ? Your sexual activity has changed since you were last screened, and you are at increased risk for chlamydia or gonorrhea. Ask your health care provider if you are at risk.  Ask your health care provider about whether you are at high risk for HIV. Your health care provider may recommend a prescription medicine to  help prevent HIV infection. If you choose to take medicine to prevent HIV, you should first get tested for HIV. You should then be tested every 3 months for as long as you are taking the medicine. Pregnancy  If you are about to stop having your period (premenopausal) and you may become pregnant, seek counseling before you get pregnant.  Take 400 to 800 micrograms (mcg) of folic acid every day if you become pregnant.  Ask for birth control (contraception) if you want to prevent pregnancy. Osteoporosis and menopause Osteoporosis is a disease in which the bones lose minerals and strength with aging. This can result in bone fractures. If you are 17 years old or older, or if you are at risk for osteoporosis and fractures, ask your health care provider if you should:  Be screened for bone loss.  Take a calcium or vitamin D supplement to lower your risk of fractures.  Be given hormone replacement therapy (HRT) to treat symptoms of menopause. Follow these instructions at home: Lifestyle  Do not use any products that contain nicotine or tobacco, such as cigarettes, e-cigarettes, and chewing tobacco. If you need help quitting, ask your health care provider.  Do not use street drugs.  Do not share needles.  Ask your health care provider for help if you need support or information about quitting drugs. Alcohol use  Do not drink alcohol if: ? Your health care provider tells you not to drink. ? You are pregnant, may be pregnant, or are planning to become pregnant.  If you drink alcohol: ? Limit how much you use to 0-1 drink a day. ? Limit intake if you are breastfeeding.  Be aware of how much alcohol is in your drink. In the [place] one drink equals one 12 oz bottle of beer (355 mL), one 5 oz glass of wine (148 mL), or one 1 oz glass of hard liquor (44 mL). General instructions  Schedule regular health, dental, and eye exams.  Stay current with your vaccines.  Tell your health care  provider if: ? You often feel depressed. ? You have ever been abused or do not feel safe at home. Summary  Adopting a healthy lifestyle and getting preventive care are important in promoting health and wellness.  Follow your health care provider's instructions about healthy diet, exercising, and getting tested or screened for diseases.  Follow your health care provider's instructions on monitoring your cholesterol and blood pressure. This information is not intended to replace advice given to you by your health care provider. Make sure you discuss any questions you have with your health care provider. Document Revised: 03/02/2018 Document Reviewed: 03/02/2018 Elsevier Patient Education  2020 [employer].

## 2020-01-17 ENCOUNTER — Encounter: Payer: Self-pay | Admitting: Physical Therapy

## 2020-01-17 ENCOUNTER — Other Ambulatory Visit: Payer: Self-pay

## 2020-01-17 ENCOUNTER — Ambulatory Visit: Payer: 59 | Admitting: Physical Therapy

## 2020-01-17 DIAGNOSIS — R293 Abnormal posture: Secondary | ICD-10-CM

## 2020-01-17 DIAGNOSIS — M25611 Stiffness of right shoulder, not elsewhere classified: Secondary | ICD-10-CM | POA: Diagnosis not present

## 2020-01-17 DIAGNOSIS — M62838 Other muscle spasm: Secondary | ICD-10-CM

## 2020-01-17 DIAGNOSIS — G8929 Other chronic pain: Secondary | ICD-10-CM

## 2020-01-17 DIAGNOSIS — M542 Cervicalgia: Secondary | ICD-10-CM | POA: Diagnosis not present

## 2020-01-17 DIAGNOSIS — M25511 Pain in right shoulder: Secondary | ICD-10-CM | POA: Diagnosis not present

## 2020-01-17 NOTE — Therapy (Signed)
[place]. [place], [place], [address] Phone: [phone]   Fax:  [phone]  Physical Therapy Treatment  Patient Details  Name: [name] MRN: [id number] Date of Birth: October 20, 1975 Referring Provider (PT): Rico Ala Date: 01/17/2020   PT End of Session - 01/17/20 1138    Visit Number 13    Date for PT Re-Evaluation 02/18/20    PT Start Time 1014    PT Stop Time 1059    PT Time Calculation (min) 45 min    Activity Tolerance Patient tolerated treatment well    Behavior During Therapy [facility] for tasks assessed/performed           Past Medical History:  Diagnosis Date  . History of chicken pox   . History of gestational diabetes   . History of measles, mumps, or rubella   . No pertinent past medical history     Past Surgical History:  Procedure Laterality Date  . BREAST CYST EXCISION  2006  . CESAREAN SECTION    . TUBAL LIGATION      There were no vitals filed for this visit.   Subjective Assessment - 01/17/20 1028    Subjective Pt reports she saw MD and CT scan, nerve conduction were all clear. Was recommended one more month of PT with a follow up 02/13/2020.    Currently in Pain? Yes    Pain Score 2     Pain Location Neck                             [facility] Adult PT Treatment/Exercise - 01/17/20 0001      Neck Exercises: Machines for Strengthening   UBE (Upper Arm Bike) 30 W constant work x 4 min    Nustep L5 x 6 min    Cybex Row 25# 2x10    Cybex Chest Press 10# 3x10    Lat Pull 25# 3x10    Other Machines for Strengthening triceps 25#, biceps 15# 2x15      Neck Exercises: Standing   Other Standing Exercises W backs with 3#, shrugs with upper trap and levator stretches 5#      Shoulder Exercises: Standing   Other Standing Exercises yellow ball overhead 2x10; red scap stab 3 ways x5 B      Shoulder Exercises: Stretch   Corner Stretch 1 rep;30 seconds     Wall Stretch - Flexion 5 reps;10 seconds    Wall Stretch - Flexion Limitations ball on wall                    PT Short Term Goals - 10/27/19 0947      PT SHORT TERM GOAL #1   Title independent with initial HEP    Time 2    Period Weeks    Status Achieved             PT Long Term Goals - 12/26/19 1054      PT LONG TERM GOAL #1   Title Improve posture and alignment with patient to demonstrate improve upright posture    Status Achieved      PT LONG TERM GOAL #2   Title increase cervical ROM 25%    Status Partially Met      PT LONG TERM GOAL #3   Title understand posture and body mechanics for ADL's and work    Status Achieved  Plan - 01/17/20 1139    Clinical Impression Statement Pt reports that CT scan and EMG study were all clear; MD would like for her to do one more month of PT with intentions of her returning to work 100% after 02/13/2020. Pt expressed some concern regarding being ready to return to same floor; begin working on some functional work strengthening and increasing weight progression. Pt demos some R shoulder pain with exercise today but no reports of increased neck pain this rx.    PT Treatment/Interventions ADLs/Self Care Home Management;Electrical Stimulation;Moist Heat;Ultrasound;Therapeutic activities;Therapeutic exercise;Manual techniques;Dry needling;Patient/family education;Cryotherapy    PT Next Visit Plan We will continue to work with her to try to assure her function with less pain    Consulted and Agree with Plan of Care Patient           Patient will benefit from skilled therapeutic intervention in order to improve the following deficits and impairments:  Pain, Improper body mechanics, Increased muscle spasms, Postural dysfunction, Decreased range of motion, Decreased strength, Impaired flexibility, Decreased activity tolerance, Cardiopulmonary status limiting activity, Decreased endurance  Visit  Diagnosis: Abnormal posture  Cervicalgia  Chronic right shoulder pain  Other muscle spasm  Stiffness of right shoulder, not elsewhere classified     Problem List Patient Active Problem List   Diagnosis Date Noted  . DDD (degenerative disc disease), cervical 04/05/2019  . Palpitations 08/03/2016  . Common migraine with intractable migraine 06/19/2016  . Vasovagal near syncope 06/18/2016  . Pre-syncope 06/18/2016  . Dizziness 04/08/2012  . Anemia associated with acute blood loss--due to adhesions and C/S 11/14/2011  . Status post repeat low transverse cesarean section and BTL 11/11/2011  . Menstrual cycle disorder 07/09/2011  . Gestational diabetes mellitus in pregnancy 07/09/2011   [name], PT, DPT [name] Arzu Mcgaughey 01/17/2020, 11:42 AM  [place]. [place], [place], [address] Phone: [phone]   Fax:  [phone]  Name: [name] MRN: [id number] Date of Birth: Jul 15, 1975

## 2020-01-18 ENCOUNTER — Ambulatory Visit (INDEPENDENT_AMBULATORY_CARE_PROVIDER_SITE_OTHER): Payer: 59 | Admitting: Family Medicine

## 2020-01-18 ENCOUNTER — Other Ambulatory Visit: Payer: Self-pay

## 2020-01-18 ENCOUNTER — Encounter: Payer: Self-pay | Admitting: Family Medicine

## 2020-01-18 ENCOUNTER — Other Ambulatory Visit: Payer: Self-pay | Admitting: Family Medicine

## 2020-01-18 VITALS — BP 118/72 | HR 79 | Resp 16 | Ht 63.0 in | Wt 134.0 lb

## 2020-01-18 DIAGNOSIS — Z23 Encounter for immunization: Secondary | ICD-10-CM

## 2020-01-18 DIAGNOSIS — Z0000 Encounter for general adult medical examination without abnormal findings: Secondary | ICD-10-CM | POA: Diagnosis not present

## 2020-01-18 DIAGNOSIS — G8929 Other chronic pain: Secondary | ICD-10-CM

## 2020-01-18 DIAGNOSIS — M25512 Pain in left shoulder: Secondary | ICD-10-CM

## 2020-01-18 DIAGNOSIS — Z1321 Encounter for screening for nutritional disorder: Secondary | ICD-10-CM | POA: Diagnosis not present

## 2020-01-18 DIAGNOSIS — M25511 Pain in right shoulder: Secondary | ICD-10-CM

## 2020-01-18 DIAGNOSIS — Z131 Encounter for screening for diabetes mellitus: Secondary | ICD-10-CM

## 2020-01-18 DIAGNOSIS — Z130 Encounter for screening for diseases of the blood and blood-forming organs and certain disorders involving the immune mechanism: Secondary | ICD-10-CM | POA: Diagnosis not present

## 2020-01-18 DIAGNOSIS — Z13220 Encounter for screening for lipoid disorders: Secondary | ICD-10-CM

## 2020-01-18 DIAGNOSIS — Z1159 Encounter for screening for other viral diseases: Secondary | ICD-10-CM | POA: Diagnosis not present

## 2020-01-18 DIAGNOSIS — E790 Hyperuricemia without signs of inflammatory arthritis and tophaceous disease: Secondary | ICD-10-CM | POA: Diagnosis not present

## 2020-01-18 DIAGNOSIS — Z1329 Encounter for screening for other suspected endocrine disorder: Secondary | ICD-10-CM | POA: Diagnosis not present

## 2020-01-18 MED ORDER — DICLOFENAC SODIUM 1 % EX GEL: 2.0000 g | Freq: Four times a day (QID) | CUTANEOUS | 1 refills | Status: DC

## 2020-01-18 MED FILL — DICLOFENAC SODIUM 1 % GEL: 1 | 12 days supply | Qty: 100 | Fill #0

## 2020-01-19 ENCOUNTER — Encounter: Payer: Self-pay | Admitting: Family Medicine

## 2020-01-19 LAB — HEMOGLOBIN A1C
Hgb A1c MFr Bld: 5.2 % of total Hgb (ref <5.7)
Mean Plasma Glucose: 103 (calc)
eAG (mmol/L): 5.7 (calc)

## 2020-01-19 LAB — COMPREHENSIVE METABOLIC PANEL
AG Ratio: 1.5 (calc) (ref 1.0–2.5)
ALT: 10 U/L (ref 6–29)
AST: 11 U/L (ref 10–30)
Albumin: 4.5 g/dL (ref 3.6–5.1)
Alkaline phosphatase (APISO): 51 U/L (ref 31–125)
BUN: 12 mg/dL (ref 7–25)
CO2: 24 mmol/L (ref 20–32)
Calcium: 9.2 mg/dL (ref 8.6–10.2)
Chloride: 106 mmol/L (ref 98–110)
Creat: 0.73 mg/dL (ref 0.50–1.10)
Globulin: 3.1 g/dL (calc) (ref 1.9–3.7)
Glucose, Bld: 91 mg/dL (ref 65–99)
Potassium: 4.3 mmol/L (ref 3.5–5.3)
Sodium: 137 mmol/L (ref 135–146)
Total Bilirubin: 0.5 mg/dL (ref 0.2–1.2)
Total Protein: 7.6 g/dL (ref 6.1–8.1)

## 2020-01-19 LAB — URIC ACID: Uric Acid, Serum: 6.9 mg/dL (ref 2.5–7.0)

## 2020-01-19 LAB — CBC
HCT: 37.2 % (ref 35.0–45.0)
Hemoglobin: 12.6 g/dL (ref 11.7–15.5)
MCH: 30.4 pg (ref 27.0–33.0)
MCHC: 33.9 g/dL (ref 32.0–36.0)
MCV: 89.6 fL (ref 80.0–100.0)
MPV: 10.9 fL (ref 7.5–12.5)
Platelets: 261 10*3/uL (ref 140–400)
RBC: 4.15 10*6/uL (ref 3.80–5.10)
RDW: 13.1 % (ref 11.0–15.0)
WBC: 8.4 10*3/uL (ref 3.8–10.8)

## 2020-01-19 LAB — VITAMIN D 25 HYDROXY (VIT D DEFICIENCY, FRACTURES): Vit D, 25-Hydroxy: 33 ng/mL (ref 30–100)

## 2020-01-19 LAB — TSH: TSH: 1.6 mIU/L

## 2020-01-19 LAB — LIPID PANEL
Cholesterol: 188 mg/dL (ref <200)
HDL: 47 mg/dL — ABNORMAL LOW (ref >=50)
LDL Cholesterol (Calc): 114 mg/dL (calc) — ABNORMAL HIGH
Non-HDL Cholesterol (Calc): 141 mg/dL (calc) — ABNORMAL HIGH (ref <130)
Total CHOL/HDL Ratio: 4 (calc) (ref <5.0)
Triglycerides: 152 mg/dL — ABNORMAL HIGH (ref <150)

## 2020-01-19 LAB — HEPATITIS C ANTIBODY
Hepatitis C Ab: NONREACTIVE
SIGNAL TO CUT-OFF: 0.02 (ref <1.00)

## 2020-01-23 ENCOUNTER — Encounter: Payer: Self-pay | Admitting: Physical Therapy

## 2020-01-23 ENCOUNTER — Ambulatory Visit: Payer: 59 | Attending: Neurosurgery | Admitting: Physical Therapy

## 2020-01-23 ENCOUNTER — Other Ambulatory Visit: Payer: Self-pay

## 2020-01-23 DIAGNOSIS — M542 Cervicalgia: Secondary | ICD-10-CM | POA: Diagnosis not present

## 2020-01-23 DIAGNOSIS — M62838 Other muscle spasm: Secondary | ICD-10-CM | POA: Diagnosis not present

## 2020-01-23 DIAGNOSIS — G8929 Other chronic pain: Secondary | ICD-10-CM | POA: Diagnosis not present

## 2020-01-23 DIAGNOSIS — M25611 Stiffness of right shoulder, not elsewhere classified: Secondary | ICD-10-CM | POA: Insufficient documentation

## 2020-01-23 DIAGNOSIS — M25511 Pain in right shoulder: Secondary | ICD-10-CM | POA: Diagnosis not present

## 2020-01-23 DIAGNOSIS — R293 Abnormal posture: Secondary | ICD-10-CM | POA: Diagnosis not present

## 2020-01-23 NOTE — Therapy (Signed)
[place]. [place], [place], [address] Phone: [phone]   Fax:  [phone]  Physical Therapy Treatment  Patient Details  Name: [name] MRN: [id number] Date of Birth: [birth year] Referring Provider (PT): Rico Ala Date: 01/23/2020   PT End of Session - 01/23/20 0922    Visit Number 14    Date for PT Re-Evaluation 02/18/20    PT Start Time 0845    PT Stop Time 0931    PT Time Calculation (min) 46 min    Activity Tolerance Patient tolerated treatment well    Behavior During Therapy [facility] for tasks assessed/performed           Past Medical History:  Diagnosis Date  . History of chicken pox   . History of gestational diabetes   . History of measles, mumps, or rubella   . No pertinent past medical history     Past Surgical History:  Procedure Laterality Date  . BREAST CYST EXCISION  2006  . CESAREAN SECTION    . TUBAL LIGATION      There were no vitals filed for this visit.   Subjective Assessment - 01/23/20 0845    Subjective Pt states that she has felt increased pain in center of neck since last PT session; has had increased trouble sleeping.    Currently in Pain? Yes    Pain Score 4     Pain Location Neck    Pain Orientation Mid                             [facility] Adult PT Treatment/Exercise - 01/23/20 0001      Neck Exercises: Machines for Strengthening   UBE (Upper Arm Bike) L3 3 fwd/3bkwd    Nustep L5 x 6 min      Shoulder Exercises: Supine   Other Supine Exercises chest press into flexion with 3# weight bar 2x10      Shoulder Exercises: Standing   Flexion Limitations 3# 2x10 with weight bar    Other Standing Exercises 3# weight bar shoulder ext and IR 2x10      Modalities   Modalities Moist Heat;Electrical Stimulation      Moist Heat Therapy   Number Minutes Moist Heat 10 Minutes    Moist Heat Location Cervical      Electrical Stimulation    Electrical Stimulation Location Cervical/UT    Electrical Stimulation Action IFC    Electrical Stimulation Parameters sitting    Electrical Stimulation Goals Pain      Manual Therapy   Manual Therapy Passive ROM;Soft tissue mobilization    Manual therapy comments Cervical paraspinales    Soft tissue mobilization STM to suboccipitals    Passive ROM cervical PROM all motions                    PT Short Term Goals - 10/27/19 0947      PT SHORT TERM GOAL #1   Title independent with initial HEP    Time 2    Period Weeks    Status Achieved             PT Long Term Goals - 12/26/19 1054      PT LONG TERM GOAL #1   Title Improve posture and alignment with patient to demonstrate improve upright posture    Status Achieved      PT LONG TERM GOAL #  2   Title increase cervical ROM 25%    Status Partially Met      PT LONG TERM GOAL #3   Title understand posture and body mechanics for ADL's and work    Status Achieved                 Plan - 01/23/20 0923    Clinical Impression Statement Pt did well during last rx; however, is reporting increased pain in neck since this session. Focused more on manual tx this rx with lighter ex's d/t reports of increased pain. Instructed to continue HEP and will attempt to resume functional strengthening next rx.    PT Treatment/Interventions ADLs/Self Care Home Management;Electrical Stimulation;Moist Heat;Ultrasound;Therapeutic activities;Therapeutic exercise;Manual techniques;Dry needling;Patient/family education;Cryotherapy    PT Next Visit Plan We will continue to work with her to try to assure her function with less pain    Consulted and Agree with Plan of Care Patient           Patient will benefit from skilled therapeutic intervention in order to improve the following deficits and impairments:  Pain, Improper body mechanics, Increased muscle spasms, Postural dysfunction, Decreased range of motion, Decreased strength,  Impaired flexibility, Decreased activity tolerance, Cardiopulmonary status limiting activity, Decreased endurance  Visit Diagnosis: Abnormal posture  Cervicalgia  Other muscle spasm     Problem List Patient Active Problem List   Diagnosis Date Noted  . DDD (degenerative disc disease), cervical 04/05/2019  . Palpitations 08/03/2016  . Common migraine with intractable migraine 06/19/2016  . Vasovagal near syncope 06/18/2016  . Pre-syncope 06/18/2016  . Dizziness 04/08/2012  . Anemia associated with acute blood loss--due to adhesions and C/S 11/14/2011  . Status post repeat low transverse cesarean section and BTL 11/11/2011  . Menstrual cycle disorder 07/09/2011  . Gestational diabetes mellitus in pregnancy 07/09/2011   [name], PT, DPT [name] Emonte Dieujuste 01/23/2020, 9:25 AM  [place]. [place], [place], [address] Phone: [phone]   Fax:  [phone]  Name: [name] MRN: [id number] Date of Birth: Nov 19, 1975

## 2020-01-29 ENCOUNTER — Other Ambulatory Visit: Payer: Self-pay

## 2020-01-29 ENCOUNTER — Encounter: Payer: Self-pay | Admitting: Physical Therapy

## 2020-01-29 ENCOUNTER — Ambulatory Visit: Payer: 59 | Admitting: Physical Therapy

## 2020-01-29 DIAGNOSIS — G8929 Other chronic pain: Secondary | ICD-10-CM | POA: Diagnosis not present

## 2020-01-29 DIAGNOSIS — M542 Cervicalgia: Secondary | ICD-10-CM

## 2020-01-29 DIAGNOSIS — M25611 Stiffness of right shoulder, not elsewhere classified: Secondary | ICD-10-CM | POA: Diagnosis not present

## 2020-01-29 DIAGNOSIS — R293 Abnormal posture: Secondary | ICD-10-CM | POA: Diagnosis not present

## 2020-01-29 DIAGNOSIS — M25511 Pain in right shoulder: Secondary | ICD-10-CM

## 2020-01-29 DIAGNOSIS — M62838 Other muscle spasm: Secondary | ICD-10-CM

## 2020-01-29 NOTE — Therapy (Signed)
[place]. [place], [place], [address] Phone: [phone]   Fax:  [phone]  Physical Therapy Treatment  Patient Details  Name: [name] MRN: [id number] Date of Birth: November 07, 1975 Referring Provider (PT): Rico Ala Date: 01/29/2020   PT End of Session - 01/29/20 0923    Visit Number 15    Date for PT Re-Evaluation 02/18/20    PT Start Time 0844    PT Stop Time 0942    PT Time Calculation (min) 58 min    Activity Tolerance Patient tolerated treatment well    Behavior During Therapy [facility] for tasks assessed/performed           Past Medical History:  Diagnosis Date  . History of chicken pox   . History of gestational diabetes   . History of measles, mumps, or rubella   . No pertinent past medical history     Past Surgical History:  Procedure Laterality Date  . BREAST CYST EXCISION  2006  . CESAREAN SECTION    . TUBAL LIGATION      There were no vitals filed for this visit.   Subjective Assessment - 01/29/20 0848    Subjective CT and NCVT were Okay, she still is reporting the neck pain and the pressure in the chest, feels like the exercises are good but worries that it is too much    Currently in Pain? Yes    Pain Score 4     Pain Location Neck                             [facility] Adult PT Treatment/Exercise - 01/29/20 0001      Neck Exercises: Machines for Strengthening   Nustep L5 x 6 min      Neck Exercises: Theraband   Shoulder Extension 20 reps;Red    Rows 20 reps;Red    Shoulder External Rotation 20 reps;Red      Neck Exercises: Standing   Other Standing Exercises W backs with 5#, shrugs with upper trap and levator stretches       Neck Exercises: Supine   Other Supine Exercise chest openers with towel rolled up along the thoacic spine and then across, doing for a minute at a time rwice      Moist Heat Therapy   Number Minutes Moist Heat 10 Minutes     Moist Heat Location Cervical      Electrical Stimulation   Electrical Stimulation Location Cervical/UT    Electrical Stimulation Action IFC    Electrical Stimulation Parameters supine    Electrical Stimulation Goals Pain      Manual Therapy   Manual Therapy Passive ROM;Soft tissue mobilization    Manual therapy comments Cervical paraspinales    Soft tissue mobilization STM to suboccipitals    Passive ROM cervical PROM all motions                    PT Short Term Goals - 10/27/19 0947      PT SHORT TERM GOAL #1   Title independent with initial HEP    Time 2    Period Weeks    Status Achieved             PT Long Term Goals - 01/29/20 0277      PT LONG TERM GOAL #2   Title increase cervical ROM 25%    Status Partially  Met      PT LONG TERM GOAL #3   Title understand posture and body mechanics for ADL's and work    Status Achieved      PT LONG TERM GOAL #4   Title decrease pain 50% with lifting    Status Partially Met                 Plan - 01/29/20 0957    Clinical Impression Statement Patient continues to c/o neck pain and chest and arm tightness, we backed off of the exercises today and I focused more on stretches, especially opening the chest.  I did more manual to work on the ROM and stretches, she reports feeling good, she does have significant spasms in the upper traps and the cervical area    PT Next Visit Plan We will continue to work with her to try to assure her function with less pain    Consulted and Agree with Plan of Care Patient           Patient will benefit from skilled therapeutic intervention in order to improve the following deficits and impairments:  Pain, Improper body mechanics, Increased muscle spasms, Postural dysfunction, Decreased range of motion, Decreased strength, Impaired flexibility, Decreased activity tolerance, Cardiopulmonary status limiting activity, Decreased endurance  Visit Diagnosis: Abnormal  posture  Cervicalgia  Other muscle spasm  Chronic right shoulder pain     Problem List Patient Active Problem List   Diagnosis Date Noted  . DDD (degenerative disc disease), cervical 04/05/2019  . Palpitations 08/03/2016  . Common migraine with intractable migraine 06/19/2016  . Vasovagal near syncope 06/18/2016  . Pre-syncope 06/18/2016  . Dizziness 04/08/2012  . Anemia associated with acute blood loss--due to adhesions and C/S 11/14/2011  . Status post repeat low transverse cesarean section and BTL 11/11/2011  . Menstrual cycle disorder 07/09/2011  . Gestational diabetes mellitus in pregnancy 07/09/2011    Sumner Boast., PT 01/29/2020, 10:08 AM  [place]. [place], [place], [address] Phone: [phone]   Fax:  [phone]  Name: Viki Carrera MRN: [id number] Date of Birth: [birth year]

## 2020-02-05 ENCOUNTER — Other Ambulatory Visit: Payer: Self-pay

## 2020-02-05 ENCOUNTER — Ambulatory Visit: Payer: 59 | Admitting: Physical Therapy

## 2020-02-05 ENCOUNTER — Encounter: Payer: Self-pay | Admitting: Physical Therapy

## 2020-02-05 DIAGNOSIS — G8929 Other chronic pain: Secondary | ICD-10-CM

## 2020-02-05 DIAGNOSIS — R293 Abnormal posture: Secondary | ICD-10-CM

## 2020-02-05 DIAGNOSIS — M542 Cervicalgia: Secondary | ICD-10-CM | POA: Diagnosis not present

## 2020-02-05 DIAGNOSIS — M62838 Other muscle spasm: Secondary | ICD-10-CM

## 2020-02-05 DIAGNOSIS — M25611 Stiffness of right shoulder, not elsewhere classified: Secondary | ICD-10-CM | POA: Diagnosis not present

## 2020-02-05 DIAGNOSIS — M25511 Pain in right shoulder: Secondary | ICD-10-CM | POA: Diagnosis not present

## 2020-02-05 NOTE — Therapy (Signed)
[name]. [place], [place], [address] Phone: [phone]   Fax:  [phone]  Physical Therapy Treatment  Patient Details  Name: [name] MRN: [id number] Date of Birth: Jul 29, 1975 Referring Provider (PT): Rico Ala Date: 02/05/2020   PT End of Session - 02/05/20 0920    Visit Number 16    Date for PT Re-Evaluation 02/18/20    PT Start Time 0844    PT Stop Time 0939    PT Time Calculation (min) 55 min    Activity Tolerance Patient tolerated treatment well    Behavior During Therapy [facility] for tasks assessed/performed           Past Medical History:  Diagnosis Date  . History of chicken pox   . History of gestational diabetes   . History of measles, mumps, or rubella   . No pertinent past medical history     Past Surgical History:  Procedure Laterality Date  . BREAST CYST EXCISION  2006  . CESAREAN SECTION    . TUBAL LIGATION      There were no vitals filed for this visit.   Subjective Assessment - 02/05/20 0849    Subjective reports a little more pain and tenderness in the C7-T1 area    Currently in Pain? Yes    Pain Score 4     Pain Location Neck    Pain Descriptors / Indicators Tender    Aggravating Factors  cleaning house, moving sofa    Pain Relieving Factors reports taking more pain medication                             [facility] Adult PT Treatment/Exercise - 02/05/20 0001      Neck Exercises: Machines for Strengthening   UBE (Upper Arm Bike) L2 3 fwd/3bkwd    Cybex Row 25# 2x10    Cybex Chest Press 10# 3x10    Lat Pull 25# 3x10    Other Machines for Strengthening triceps 25#, biceps 15# 2x15      Neck Exercises: Standing   Other Standing Exercises 2# I, Y, T's    Other Standing Exercises W backs 2#,  with 5#, shrugs with upper trap and levator stretches       Neck Exercises: Seated   Other Seated Exercise bent over row 4#, reverse flies and  extensions 2#      Neck Exercises: Supine   Other Supine Exercise chest openers with towel rolled up along the thoacic spine and then across, doing for a minute at a time rwice      Shoulder Exercises: Stretch   Corner Stretch 2 reps;20 seconds    Star Gazer Stretch 3 reps;20 seconds      Moist Heat Therapy   Number Minutes Moist Heat 10 Minutes    Moist Heat Location Cervical      Electrical Stimulation   Electrical Stimulation Location Cervical/UT    Electrical Stimulation Action IFC    Electrical Stimulation Parameters supine    Electrical Stimulation Goals Pain      Manual Therapy   Manual Therapy Passive ROM;Soft tissue mobilization    Manual therapy comments Cervical paraspinales    Soft tissue mobilization STM to suboccipitals    Passive ROM cervical PROM all motions                    PT Short Term Goals -  10/27/19 0947      PT SHORT TERM GOAL #1   Title independent with initial HEP    Time 2    Period Weeks    Status Achieved             PT Long Term Goals - 02/05/20 0924      PT LONG TERM GOAL #2   Title increase cervical ROM 25%    Status Partially Met                 Plan - 02/05/20 0921    Clinical Impression Statement Patient reports that she moved some furniture and did some vacuuming, She reports that she is sore and tender in the C7-T1 area, she is tight and tender in the upper traps and the pectoral area.  She struggles some with the exercises but does not report increased pain while doing    PT Next Visit Plan We will continue to work with her to try to assure her function with less pain    Consulted and Agree with Plan of Care Patient           Patient will benefit from skilled therapeutic intervention in order to improve the following deficits and impairments:  Pain, Improper body mechanics, Increased muscle spasms, Postural dysfunction, Decreased range of motion, Decreased strength, Impaired flexibility, Decreased  activity tolerance, Cardiopulmonary status limiting activity, Decreased endurance  Visit Diagnosis: Abnormal posture  Cervicalgia  Other muscle spasm  Chronic right shoulder pain     Problem List Patient Active Problem List   Diagnosis Date Noted  . DDD (degenerative disc disease), cervical 04/05/2019  . Palpitations 08/03/2016  . Common migraine with intractable migraine 06/19/2016  . Vasovagal near syncope 06/18/2016  . Pre-syncope 06/18/2016  . Dizziness 04/08/2012  . Anemia associated with acute blood loss--due to adhesions and C/S 11/14/2011  . Status post repeat low transverse cesarean section and BTL 11/11/2011  . Menstrual cycle disorder 07/09/2011  . Gestational diabetes mellitus in pregnancy 07/09/2011    Sumner Boast., PT 02/05/2020, 9:24 AM  [place]. [place], [place], [address] Phone: [phone]   Fax:  [phone]  Name: [name] MRN: [id number] Date of Birth: 10-29-1975

## 2020-02-12 ENCOUNTER — Ambulatory Visit: Payer: 59 | Admitting: Physical Therapy

## 2020-02-12 ENCOUNTER — Other Ambulatory Visit: Payer: Self-pay

## 2020-02-12 ENCOUNTER — Encounter: Payer: Self-pay | Admitting: Physical Therapy

## 2020-02-12 DIAGNOSIS — G8929 Other chronic pain: Secondary | ICD-10-CM | POA: Diagnosis not present

## 2020-02-12 DIAGNOSIS — M62838 Other muscle spasm: Secondary | ICD-10-CM

## 2020-02-12 DIAGNOSIS — R293 Abnormal posture: Secondary | ICD-10-CM

## 2020-02-12 DIAGNOSIS — M25511 Pain in right shoulder: Secondary | ICD-10-CM | POA: Diagnosis not present

## 2020-02-12 DIAGNOSIS — M542 Cervicalgia: Secondary | ICD-10-CM

## 2020-02-12 DIAGNOSIS — M25611 Stiffness of right shoulder, not elsewhere classified: Secondary | ICD-10-CM

## 2020-02-12 NOTE — Therapy (Signed)
[place]. [place], [place], [address] Phone: [phone]   Fax:  [phone]  Physical Therapy Treatment  Patient Details  Name: [name] MRN: [id number] Date of Birth: 07/24/1975 Referring Provider (PT): Rico Ala Date: 02/12/2020   PT End of Session - 02/12/20 0913    Visit Number 17    Date for PT Re-Evaluation 02/18/20    PT Start Time 0844    PT Stop Time 0925    PT Time Calculation (min) 41 min    Activity Tolerance Patient tolerated treatment well    Behavior During Therapy [facility] for tasks assessed/performed           Past Medical History:  Diagnosis Date  . History of chicken pox   . History of gestational diabetes   . History of measles, mumps, or rubella   . No pertinent past medical history     Past Surgical History:  Procedure Laterality Date  . BREAST CYST EXCISION  2006  . CESAREAN SECTION    . TUBAL LIGATION      There were no vitals filed for this visit.   Subjective Assessment - 02/12/20 0847    Subjective Feels about the same    Currently in Pain? Yes    Pain Score 4     Pain Location Neck    Aggravating Factors  did not sleep well              [facility] PT Assessment - 02/12/20 0001      AROM   Overall AROM Comments Cervical ROM flexion and extension decreased 50%, rotaiton decreased 25%, side bending decreased 50%    Right Shoulder Flexion 160 Degrees    Right Shoulder Internal Rotation 65 Degrees    Right Shoulder External Rotation 75 Degrees      Strength   Overall Strength Comments Strength 4+/5 with some c/o increased  pressure in the neck area                         [facility] Adult PT Treatment/Exercise - 02/12/20 0001      Neck Exercises: Machines for Strengthening   UBE (Upper Arm Bike) L2 3 fwd/3bkwd    Cybex Row 25# 2x10    Cybex Chest Press 10# x10, then 5# with a bigger ROM 2x10    Lat Pull 25# 3x10    Other Machines for  Strengthening triceps 25#, biceps 15# 2x15      Neck Exercises: Theraband   Shoulder Extension 20 reps;Red    Rows 20 reps;Red    Shoulder External Rotation 20 reps;Red      Neck Exercises: Standing   Other Standing Exercises 2# I, Y, T's    Other Standing Exercises W backs 2#,  with 5#, shrugs with upper trap and levator stretches       Neck Exercises: Seated   Other Seated Exercise bent over row 5#, reverse flies and extensions 2#      Shoulder Exercises: Seated   Other Seated Exercises star gazer stretch with towel roll to open chest and thoracic spine                    PT Short Term Goals - 10/27/19 0947      PT SHORT TERM GOAL #1   Title independent with initial HEP    Time 2    Period Weeks    Status  Achieved             PT Long Term Goals - 02/12/20 0919      PT LONG TERM GOAL #1   Title Improve posture and alignment with patient to demonstrate improve upright posture    Status Achieved      PT LONG TERM GOAL #2   Title increase cervical ROM 25%    Status Partially Met      PT LONG TERM GOAL #3   Title understand posture and body mechanics for ADL's and work    Status Achieved      PT LONG TERM GOAL #4   Title decrease pain 50% with lifting    Status Partially Met      PT LONG TERM GOAL #5   Title report no difficulty getting dressed and doing hair    Status Partially Met                 Plan - 02/12/20 0913    Clinical Impression Statement Patient has continued to report pressure and pain on the right at C5-7.  She has tightness in the right shoulder but his is much improved, she is having tightness and pressure in her chest just below the collar bones, this seems to be pectoral tightness and we are working on this with thoracic extension and stretching.  She has been lifting 25# with some fatigue but no increae of pain.  Reports still some issues putting her hair up in a pony tail.  Reports difficulty at home lifting and pushing  trying to clean or rearrange furniture will cause the same increased pressure in the neck area    PT Next Visit Plan please advise if we should continue    Consulted and Agree with Plan of Care Patient           Patient will benefit from skilled therapeutic intervention in order to improve the following deficits and impairments:  Pain, Improper body mechanics, Increased muscle spasms, Postural dysfunction, Decreased range of motion, Decreased strength, Impaired flexibility, Decreased activity tolerance, Cardiopulmonary status limiting activity, Decreased endurance  Visit Diagnosis: Abnormal posture  Cervicalgia  Other muscle spasm  Chronic right shoulder pain  Stiffness of right shoulder, not elsewhere classified     Problem List Patient Active Problem List   Diagnosis Date Noted  . DDD (degenerative disc disease), cervical 04/05/2019  . Palpitations 08/03/2016  . Common migraine with intractable migraine 06/19/2016  . Vasovagal near syncope 06/18/2016  . Pre-syncope 06/18/2016  . Dizziness 04/08/2012  . Anemia associated with acute blood loss--due to adhesions and C/S 11/14/2011  . Status post repeat low transverse cesarean section and BTL 11/11/2011  . Menstrual cycle disorder 07/09/2011  . Gestational diabetes mellitus in pregnancy 07/09/2011    Sumner Boast., PT 02/12/2020, 9:20 AM  [place]. [place], [place], [address] Phone: [phone]   Fax:  [phone]  Name: [name] MRN: [id number] Date of Birth: [birth year]

## 2020-02-13 DIAGNOSIS — M542 Cervicalgia: Secondary | ICD-10-CM | POA: Diagnosis not present

## 2020-02-13 DIAGNOSIS — R030 Elevated blood-pressure reading, without diagnosis of hypertension: Secondary | ICD-10-CM | POA: Diagnosis not present

## 2020-02-22 ENCOUNTER — Ambulatory Visit: Payer: 59 | Attending: Neurosurgery | Admitting: Physical Therapy

## 2020-02-22 ENCOUNTER — Encounter: Payer: Self-pay | Admitting: Physical Therapy

## 2020-02-22 ENCOUNTER — Other Ambulatory Visit: Payer: Self-pay

## 2020-02-22 DIAGNOSIS — G8929 Other chronic pain: Secondary | ICD-10-CM | POA: Insufficient documentation

## 2020-02-22 DIAGNOSIS — M25611 Stiffness of right shoulder, not elsewhere classified: Secondary | ICD-10-CM | POA: Insufficient documentation

## 2020-02-22 DIAGNOSIS — R293 Abnormal posture: Secondary | ICD-10-CM | POA: Diagnosis not present

## 2020-02-22 DIAGNOSIS — M25511 Pain in right shoulder: Secondary | ICD-10-CM | POA: Diagnosis not present

## 2020-02-22 DIAGNOSIS — M542 Cervicalgia: Secondary | ICD-10-CM | POA: Diagnosis not present

## 2020-02-22 DIAGNOSIS — M62838 Other muscle spasm: Secondary | ICD-10-CM | POA: Diagnosis not present

## 2020-02-22 NOTE — Therapy (Signed)
[place]. [place], [place], [address] Phone: [phone]   Fax:  [phone]  Physical Therapy Treatment  Patient Details  Name: [name] MRN: [id number] Date of Birth: 1975-12-22 Referring Provider (PT): Rico Ala Date: 02/22/2020   PT End of Session - 02/22/20 0924    Visit Number 18    Date for PT Re-Evaluation 02/18/20    PT Start Time 0842    PT Stop Time 0935    PT Time Calculation (min) 53 min    Activity Tolerance Patient tolerated treatment well    Behavior During Therapy [facility] for tasks assessed/performed           Past Medical History:  Diagnosis Date   History of chicken pox    History of gestational diabetes    History of measles, mumps, or rubella    No pertinent past medical history     Past Surgical History:  Procedure Laterality Date   BREAST CYST EXCISION  2006   CESAREAN SECTION     TUBAL LIGATION      There were no vitals filed for this visit.   Subjective Assessment - 02/22/20 0844    Subjective MD wants her to continues with PT, Feel the same the pain.    Currently in Pain? Yes    Pain Score 3     Pain Location Neck                             [facility] Adult PT Treatment/Exercise - 02/22/20 0001      Neck Exercises: Machines for Strengthening   UBE (Upper Arm Bike) L2 3 fwd/3bkwd    Cybex Row 25# 2x10    Cybex Chest Press 10# x10, then 5# with a bigger ROM 2x10    Lat Pull 25# 3x10      Neck Exercises: Standing   Other Standing Exercises 2# I, Y, T's    Other Standing Exercises ER red back against wall on half foam roll  then W backs       Neck Exercises: Seated   Neck Retraction 3 secs;20 reps    Other Seated Exercise bent over row 5#, reverse flies and extensions 2#      Electrical Stimulation   Electrical Stimulation Location Cervical/UT    Electrical Stimulation Action IFC    Electrical Stimulation Parameters Sitting     Electrical Stimulation Goals Pain                    PT Short Term Goals - 10/27/19 0947      PT SHORT TERM GOAL #1   Title independent with initial HEP    Time 2    Period Weeks    Status Achieved             PT Long Term Goals - 02/12/20 0919      PT LONG TERM GOAL #1   Title Improve posture and alignment with patient to demonstrate improve upright posture    Status Achieved      PT LONG TERM GOAL #2   Title increase cervical ROM 25%    Status Partially Met      PT LONG TERM GOAL #3   Title understand posture and body mechanics for ADL's and work    Status Achieved      PT LONG TERM GOAL #4   Title decrease pain  50% with lifting    Status Partially Met      PT LONG TERM GOAL #5   Title report no difficulty getting dressed and doing hair    Status Partially Met                 Plan - 02/22/20 0924    Clinical Impression Statement Patient enters clinic with a new order to continue with Pt as well as a TENs unit. Pt given handout of TENs that she could elect to purchase. She reports having some pressure in her chest with interventions that promote scapular retractions. She reports a pulling sensation in her posterior neck with seated rows. No change overall reported with her symptoms. She also stated that she is waiting on a phone call from pain management MD.    Rehab Potential Good    PT Frequency 1x / week    PT Duration 8 weeks    PT Treatment/Interventions ADLs/Self Care Home Management;Electrical Stimulation;Moist Heat;Ultrasound;Therapeutic activities;Therapeutic exercise;Manual techniques;Dry needling;Patient/family education;Cryotherapy    PT Next Visit Plan Scapula stabilization and cervical ROM           Patient will benefit from skilled therapeutic intervention in order to improve the following deficits and impairments:  Pain, Improper body mechanics, Increased muscle spasms, Postural dysfunction, Decreased range of motion, Decreased  strength, Impaired flexibility, Decreased activity tolerance, Cardiopulmonary status limiting activity, Decreased endurance  Visit Diagnosis: Cervicalgia  Abnormal posture  Other muscle spasm     Problem List Patient Active Problem List   Diagnosis Date Noted   DDD (degenerative disc disease), cervical 04/05/2019   Palpitations 08/03/2016   Common migraine with intractable migraine 06/19/2016   Vasovagal near syncope 06/18/2016   Pre-syncope 06/18/2016   Dizziness 04/08/2012   Anemia associated with acute blood loss--due to adhesions and C/S 11/14/2011   Status post repeat low transverse cesarean section and BTL 11/11/2011   Menstrual cycle disorder 07/09/2011   Gestational diabetes mellitus in pregnancy 07/09/2011    Scot Jun 02/22/2020, 9:29 AM  [place]. [place], [place], [address] Phone: [phone]   Fax:  [phone]  Name: [name] MRN: [id number] Date of Birth: 1976/01/03

## 2020-02-26 ENCOUNTER — Ambulatory Visit: Payer: 59 | Admitting: Physical Therapy

## 2020-02-26 ENCOUNTER — Other Ambulatory Visit: Payer: Self-pay

## 2020-02-26 ENCOUNTER — Encounter: Payer: Self-pay | Admitting: Physical Therapy

## 2020-02-26 DIAGNOSIS — M62838 Other muscle spasm: Secondary | ICD-10-CM

## 2020-02-26 DIAGNOSIS — M25511 Pain in right shoulder: Secondary | ICD-10-CM | POA: Diagnosis not present

## 2020-02-26 DIAGNOSIS — M25611 Stiffness of right shoulder, not elsewhere classified: Secondary | ICD-10-CM

## 2020-02-26 DIAGNOSIS — R293 Abnormal posture: Secondary | ICD-10-CM

## 2020-02-26 DIAGNOSIS — M542 Cervicalgia: Secondary | ICD-10-CM | POA: Diagnosis not present

## 2020-02-26 DIAGNOSIS — G8929 Other chronic pain: Secondary | ICD-10-CM

## 2020-02-26 NOTE — Therapy (Signed)
[place]. [place], [place], [address] Phone: [phone]   Fax:  [phone]  Physical Therapy Treatment  Patient Details  Name: Florette Thai MRN: [id number] Date of Birth: Aug 20, 1975 Referring Provider (PT): Rico Ala Date: 02/26/2020   PT End of Session - 02/26/20 0923    Visit Number 19    Date for PT Re-Evaluation 03/24/20    PT Start Time 0842    PT Stop Time 0926    PT Time Calculation (min) 44 min    Activity Tolerance Patient tolerated treatment well    Behavior During Therapy [facility] for tasks assessed/performed           Past Medical History:  Diagnosis Date  . History of chicken pox   . History of gestational diabetes   . History of measles, mumps, or rubella   . No pertinent past medical history     Past Surgical History:  Procedure Laterality Date  . BREAST CYST EXCISION  2006  . CESAREAN SECTION    . TUBAL LIGATION      There were no vitals filed for this visit.   Subjective Assessment - 02/26/20 0845    Subjective MD wants to continue PT, sent order for OT that we will try to get set up here next week, also has been referred to pain management, MD wants to reassess a return to work at 9 months post op    Currently in Pain? Yes    Pain Score 2     Pain Location Neck    Pain Descriptors / Indicators Tightness    Aggravating Factors  activity                             [facility] Adult PT Treatment/Exercise - 02/26/20 0001      Neck Exercises: Machines for Strengthening   UBE (Upper Arm Bike) L2.5 3 fwd/3bkwd    Cybex Row 25# 2x10    Cybex Chest Press 5# 3x10    Lat Pull 25# 3x10      Neck Exercises: Standing   Other Standing Exercises 2# I, Y, T's    Other Standing Exercises ER red back against wall on half foam roll  then W backs with 2#, then red tband horizontal abduction      Neck Exercises: Seated   Cervical Isometrics Flexion;Extension;Right  lateral flexion;Left lateral flexion;Right rotation;Left rotation;3 secs;5 reps      Neck Exercises: Supine   Other Supine Exercise chest openers with towel rolled up along the thoacic spine and then across, doing for a minute at a time rwice      Shoulder Exercises: Seated   Other Seated Exercises star gazer stretch with towel roll to open chest and thoracic spine      Manual Therapy   Manual Therapy Passive ROM;Neural Stretch    Passive ROM cervical PROM all motions    Neural Stretch median nerve glides, some assist in ulnar glides                    PT Short Term Goals - 10/27/19 0947      PT SHORT TERM GOAL #1   Title independent with initial HEP    Time 2    Period Weeks    Status Achieved             PT Long Term Goals - 02/26/20 0277  PT LONG TERM GOAL #1   Title Improve posture and alignment with patient to demonstrate improve upright posture    Status Achieved      PT LONG TERM GOAL #2   Title increase cervical ROM 25%    Status Partially Met                 Plan - 02/26/20 0924    Clinical Impression Statement Patient has been referred to pain management MD by the surgeon, we are to continue, the MD is looking at a try to return to work at 9 months post op.  She does well, just has tightness in the Ue's, the chest and feels pain in the neck and the chest area.  Some positive neural tension signs, the motions and pain are much better than when we started    PT Frequency 2x / week    PT Duration 4 weeks    PT Treatment/Interventions ADLs/Self Care Home Management;Electrical Stimulation;Moist Heat;Ultrasound;Therapeutic activities;Therapeutic exercise;Manual techniques;Dry needling;Patient/family education;Cryotherapy    PT Next Visit Plan we will focus on our plan working toward a return to work, also she may bring in a TENS that she has at home to assure safety with this    Consulted and Agree with Plan of Care Patient            Patient will benefit from skilled therapeutic intervention in order to improve the following deficits and impairments:  Pain, Improper body mechanics, Increased muscle spasms, Postural dysfunction, Decreased range of motion, Decreased strength, Impaired flexibility, Decreased activity tolerance, Cardiopulmonary status limiting activity, Decreased endurance  Visit Diagnosis: Cervicalgia - Plan: PT plan of care cert/re-cert  Abnormal posture - Plan: PT plan of care cert/re-cert  Other muscle spasm - Plan: PT plan of care cert/re-cert  Chronic right shoulder pain - Plan: PT plan of care cert/re-cert  Stiffness of right shoulder, not elsewhere classified - Plan: PT plan of care cert/re-cert     Problem List Patient Active Problem List   Diagnosis Date Noted  . DDD (degenerative disc disease), cervical 04/05/2019  . Palpitations 08/03/2016  . Common migraine with intractable migraine 06/19/2016  . Vasovagal near syncope 06/18/2016  . Pre-syncope 06/18/2016  . Dizziness 04/08/2012  . Anemia associated with acute blood loss--due to adhesions and C/S 11/14/2011  . Status post repeat low transverse cesarean section and BTL 11/11/2011  . Menstrual cycle disorder 07/09/2011  . Gestational diabetes mellitus in pregnancy 07/09/2011    Sumner Boast., PT 02/26/2020, 11:52 AM  [place]. [place], [place], [address] Phone: [phone]   Fax:  [phone]  Name: Fatema Rabe MRN: [id number] Date of Birth: 08-19-1975

## 2020-03-04 ENCOUNTER — Encounter (HOSPITAL_BASED_OUTPATIENT_CLINIC_OR_DEPARTMENT_OTHER): Payer: Self-pay

## 2020-03-04 ENCOUNTER — Ambulatory Visit (HOSPITAL_BASED_OUTPATIENT_CLINIC_OR_DEPARTMENT_OTHER)
Admission: RE | Admit: 2020-03-04 | Discharge: 2020-03-04 | Disposition: A | Discharge status: Home / Self Care | Payer: 59 | Source: Ambulatory Visit | Attending: Family Medicine | Admitting: Family Medicine

## 2020-03-04 ENCOUNTER — Other Ambulatory Visit: Payer: Self-pay

## 2020-03-04 DIAGNOSIS — Z1231 Encounter for screening mammogram for malignant neoplasm of breast: Secondary | ICD-10-CM | POA: Insufficient documentation

## 2020-03-05 ENCOUNTER — Ambulatory Visit: Payer: 59 | Admitting: Physical Therapy

## 2020-03-05 ENCOUNTER — Encounter: Payer: Self-pay | Admitting: Physical Therapy

## 2020-03-05 ENCOUNTER — Other Ambulatory Visit (HOSPITAL_BASED_OUTPATIENT_CLINIC_OR_DEPARTMENT_OTHER): Payer: Self-pay | Admitting: Physical Medicine and Rehabilitation

## 2020-03-05 DIAGNOSIS — R293 Abnormal posture: Secondary | ICD-10-CM | POA: Diagnosis not present

## 2020-03-05 DIAGNOSIS — R030 Elevated blood-pressure reading, without diagnosis of hypertension: Secondary | ICD-10-CM | POA: Diagnosis not present

## 2020-03-05 DIAGNOSIS — M542 Cervicalgia: Secondary | ICD-10-CM | POA: Diagnosis not present

## 2020-03-05 DIAGNOSIS — M25511 Pain in right shoulder: Secondary | ICD-10-CM | POA: Diagnosis not present

## 2020-03-05 DIAGNOSIS — M7918 Myalgia, other site: Secondary | ICD-10-CM | POA: Diagnosis not present

## 2020-03-05 DIAGNOSIS — G8929 Other chronic pain: Secondary | ICD-10-CM | POA: Diagnosis not present

## 2020-03-05 DIAGNOSIS — M25611 Stiffness of right shoulder, not elsewhere classified: Secondary | ICD-10-CM | POA: Diagnosis not present

## 2020-03-05 DIAGNOSIS — M62838 Other muscle spasm: Secondary | ICD-10-CM

## 2020-03-05 MED FILL — OXYCODONE-ACETAMINOPHEN 5-3: 5-325 | 30 days supply | Qty: 30 | Fill #0

## 2020-03-05 NOTE — Therapy (Signed)
[place]. [place], [place], [address] Phone: [phone]   Fax:  [phone]  Physical Therapy Treatment  Patient Details  Name: [name] MRN: [id number] Date of Birth: Sep 06, 1975 Referring Provider (PT): Rico Ala Date: 03/05/2020   PT End of Session - 03/05/20 0924    Visit Number 20    Date for PT Re-Evaluation 03/24/20    PT Start Time 0845    PT Stop Time 0926    PT Time Calculation (min) 41 min    Activity Tolerance Patient tolerated treatment well    Behavior During Therapy [facility] for tasks assessed/performed           Past Medical History:  Diagnosis Date  . History of chicken pox   . History of gestational diabetes   . History of measles, mumps, or rubella   . No pertinent past medical history     Past Surgical History:  Procedure Laterality Date  . BREAST CYST EXCISION  2006  . CESAREAN SECTION    . TUBAL LIGATION      There were no vitals filed for this visit.   Subjective Assessment - 03/05/20 0848    Subjective Going to see the MD today for pain management.    Currently in Pain? Yes    Pain Score 2     Pain Location Neck                             [facility] Adult PT Treatment/Exercise - 03/05/20 0001      Neck Exercises: Machines for Strengthening   UBE (Upper Arm Bike) L2.5 3 fwd/3bkwd    Cybex Row 25# 2x10    Cybex Chest Press 5# 3x10    Lat Pull 25# 3x10      Neck Exercises: Standing   Other Standing Exercises 2# I, Y, T's    Other Standing Exercises ER red back against wall on half foam roll  then W backs with 2#, then red tband horizontal abduction      Neck Exercises: Seated   Cervical Isometrics Flexion;Extension;Right lateral flexion;Left lateral flexion;Right rotation;Left rotation;3 secs;5 reps                  PT Education - 03/05/20 0923    Education Details TENs unit application and use    Person(s) Educated Patient     Methods Explanation;Demonstration;Tactile cues;Verbal cues    Comprehension Verbalized understanding;Returned demonstration            PT Short Term Goals - 10/27/19 0947      PT SHORT TERM GOAL #1   Title independent with initial HEP    Time 2    Period Weeks    Status Achieved             PT Long Term Goals - 02/26/20 9702      PT LONG TERM GOAL #1   Title Improve posture and alignment with patient to demonstrate improve upright posture    Status Achieved      PT LONG TERM GOAL #2   Title increase cervical ROM 25%    Status Partially Met                 Plan - 03/05/20 0925    Clinical Impression Statement Pt has pain management MD visit today, as well a neurosurgeon visit on Thursday. She continues to have tightness  and pain in the cervical spine, cervical side bending is the most limited. She reports a pulling sensation across her chest with horizontal abduction and W backs. Pt brung her on TENs unit for education on proper application and use.    Rehab Potential Good    PT Frequency 2x / week    PT Duration 4 weeks    PT Treatment/Interventions ADLs/Self Care Home Management;Electrical Stimulation;Moist Heat;Ultrasound;Therapeutic activities;Therapeutic exercise;Manual techniques;Dry needling;Patient/family education;Cryotherapy    PT Next Visit Plan we will focus on our plan working toward a return to work           Patient will benefit from skilled therapeutic intervention in order to improve the following deficits and impairments:  Pain,Improper body mechanics,Increased muscle spasms,Postural dysfunction,Decreased range of motion,Decreased strength,Impaired flexibility,Decreased activity tolerance,Cardiopulmonary status limiting activity,Decreased endurance  Visit Diagnosis: Other muscle spasm  Abnormal posture  Cervicalgia     Problem List Patient Active Problem List   Diagnosis Date Noted  . DDD (degenerative disc disease), cervical  04/05/2019  . Palpitations 08/03/2016  . Common migraine with intractable migraine 06/19/2016  . Vasovagal near syncope 06/18/2016  . Pre-syncope 06/18/2016  . Dizziness 04/08/2012  . Anemia associated with acute blood loss--due to adhesions and C/S 11/14/2011  . Status post repeat low transverse cesarean section and BTL 11/11/2011  . Menstrual cycle disorder 07/09/2011  . Gestational diabetes mellitus in pregnancy 07/09/2011    Scot Jun, PTA 03/05/2020, 9:28 AM  [place]. [place], [place], [address] Phone: [phone]   Fax:  [phone]  Name: Maille Halliwell MRN: [id number] Date of Birth: [birth year]

## 2020-03-07 ENCOUNTER — Ambulatory Visit: Payer: 59 | Admitting: Occupational Therapy

## 2020-03-07 DIAGNOSIS — R030 Elevated blood-pressure reading, without diagnosis of hypertension: Secondary | ICD-10-CM | POA: Diagnosis not present

## 2020-03-07 DIAGNOSIS — M7918 Myalgia, other site: Secondary | ICD-10-CM | POA: Diagnosis not present

## 2020-03-11 ENCOUNTER — Ambulatory Visit: Payer: 59 | Admitting: Physical Therapy

## 2020-03-14 ENCOUNTER — Ambulatory Visit: Payer: 59 | Admitting: Physical Therapy

## 2020-03-14 ENCOUNTER — Encounter: Payer: Self-pay | Admitting: Physical Therapy

## 2020-03-14 ENCOUNTER — Other Ambulatory Visit: Payer: Self-pay

## 2020-03-14 DIAGNOSIS — M542 Cervicalgia: Secondary | ICD-10-CM | POA: Diagnosis not present

## 2020-03-14 DIAGNOSIS — M62838 Other muscle spasm: Secondary | ICD-10-CM

## 2020-03-14 DIAGNOSIS — G8929 Other chronic pain: Secondary | ICD-10-CM | POA: Diagnosis not present

## 2020-03-14 DIAGNOSIS — M25611 Stiffness of right shoulder, not elsewhere classified: Secondary | ICD-10-CM | POA: Diagnosis not present

## 2020-03-14 DIAGNOSIS — R293 Abnormal posture: Secondary | ICD-10-CM

## 2020-03-14 DIAGNOSIS — M25511 Pain in right shoulder: Secondary | ICD-10-CM | POA: Diagnosis not present

## 2020-03-14 NOTE — Therapy (Signed)
[place]. [place], [place], [address] Phone: [phone]   Fax:  [phone]  Physical Therapy Treatment  Patient Details  Name: [name] MRN: [id number] Date of Birth: [birth year] Referring Provider (PT): Rico Ala Date: 03/14/2020   PT End of Session - 03/14/20 0839    Visit Number 21    Date for PT Re-Evaluation 03/24/20    PT Start Time 0756    PT Stop Time 0838    PT Time Calculation (min) 42 min    Activity Tolerance Patient tolerated treatment well    Behavior During Therapy [facility] for tasks assessed/performed           Past Medical History:  Diagnosis Date  . History of chicken pox   . History of gestational diabetes   . History of measles, mumps, or rubella   . No pertinent past medical history     Past Surgical History:  Procedure Laterality Date  . BREAST CYST EXCISION  2006  . CESAREAN SECTION    . TUBAL LIGATION      There were no vitals filed for this visit.   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  She will be mostly sitting, on computer and on phone.    Currently in Pain? Yes    Pain Score 2     Pain Location Neck    Pain Descriptors / Indicators Tightness    Aggravating Factors  activity, sitting on computer                             [facility] Adult PT Treatment/Exercise - 03/14/20 0001      Neck Exercises: Machines for Strengthening   UBE (Upper Arm Bike) L2.5 3 fwd/3bkwd    Cybex Row 25# 2x10    Cybex Chest Press 10# 3x10    Lat Pull 25# 3x10    Other Machines for Strengthening triceps 25#, biceps 15# 2x15      Neck Exercises: Standing   Other Standing Exercises 3# I, Y, T's    Other Standing Exercises ER red back against wall on half foam roll  then W backs with 2#, then red tband horizontal abduction, red tband horizontal abduction,  doorway stretch      Neck Exercises: Seated   Other Seated Exercise bent over row 6#, reverse flies and extensions 3#      Manual Therapy   Manual Therapy Passive ROM;Neural Stretch    Passive ROM cervical PROM all motions right shoulder all motions    Neural Stretch median nerve glides, some assist in ulnar glides                    PT Short Term Goals - 10/27/19 0947      PT SHORT TERM GOAL #1   Title independent with initial HEP    Time 2    Period Weeks    Status Achieved             PT Long Term Goals - 03/14/20 0841      PT LONG TERM GOAL #2   Title increase cervical ROM 25%    Status Achieved      PT LONG TERM GOAL #3   Title understand posture and body mechanics for ADL's and work  Plan - 03/14/20 0839    Clinical Impression Statement I spoke with patient about posture and breaks especially to open up chest and move her head as she is planning on going to work next week, will be mostly on the phone and computer.  She seemed to be tight today in the median nerve bilaterally, the passive ROM of the cervical spine is good a little tight at end range, as is the roight shoulder.    PT Next Visit Plan we will focus on our plan working toward a return to work    [employer] and Agree with Plan of Care Patient           Patient will benefit from skilled therapeutic intervention in order to improve the following deficits and impairments:  Pain,Improper body mechanics,Increased muscle spasms,Postural dysfunction,Decreased range of motion,Decreased strength,Impaired flexibility,Decreased activity tolerance,Cardiopulmonary status limiting activity,Decreased endurance  Visit Diagnosis: Other muscle spasm  Abnormal posture  Cervicalgia  Chronic right shoulder pain  Stiffness of right shoulder, not elsewhere classified     Problem List Patient Active Problem List   Diagnosis Date Noted  . DDD (degenerative disc disease), cervical  04/05/2019  . Palpitations 08/03/2016  . Common migraine with intractable migraine 06/19/2016  . Vasovagal near syncope 06/18/2016  . Pre-syncope 06/18/2016  . Dizziness 04/08/2012  . Anemia associated with acute blood loss--due to adhesions and C/S 11/14/2011  . Status post repeat low transverse cesarean section and BTL 11/11/2011  . Menstrual cycle disorder 07/09/2011  . Gestational diabetes mellitus in pregnancy 07/09/2011    Sumner Boast., PT 03/14/2020, 8:42 AM  [place]. [place], [place], [address] Phone: [phone]   Fax:  [phone]  Name: [name] MRN: [id number] Date of Birth: 11-05-1975

## 2020-03-18 ENCOUNTER — Ambulatory Visit: Payer: 59 | Admitting: Physical Therapy

## 2020-03-19 ENCOUNTER — Ambulatory Visit: Payer: 59 | Admitting: Physical Therapy

## 2020-03-19 ENCOUNTER — Encounter: Payer: 59 | Admitting: Occupational Therapy

## 2020-03-21 ENCOUNTER — Other Ambulatory Visit: Payer: Self-pay

## 2020-03-21 ENCOUNTER — Ambulatory Visit: Payer: 59 | Admitting: Physical Therapy

## 2020-03-21 DIAGNOSIS — R293 Abnormal posture: Secondary | ICD-10-CM | POA: Diagnosis not present

## 2020-03-21 DIAGNOSIS — G8929 Other chronic pain: Secondary | ICD-10-CM | POA: Diagnosis not present

## 2020-03-21 DIAGNOSIS — M542 Cervicalgia: Secondary | ICD-10-CM | POA: Diagnosis not present

## 2020-03-21 DIAGNOSIS — M25611 Stiffness of right shoulder, not elsewhere classified: Secondary | ICD-10-CM | POA: Diagnosis not present

## 2020-03-21 DIAGNOSIS — M62838 Other muscle spasm: Secondary | ICD-10-CM

## 2020-03-21 DIAGNOSIS — M25511 Pain in right shoulder: Secondary | ICD-10-CM | POA: Diagnosis not present

## 2020-03-21 NOTE — Therapy (Signed)
[place]. [place], [place], [address] Phone: [phone]   Fax:  [phone]  Physical Therapy Treatment  Patient Details  Name: [name] MRN: [id number] Date of Birth: 1975-08-21 Referring Provider (PT): Rico Ala Date: 03/21/2020   PT End of Session - 03/21/20 1649    Visit Number 22    Date for PT Re-Evaluation 03/24/20    PT Start Time 1610    PT Stop Time 1650    PT Time Calculation (min) 40 min           Past Medical History:  Diagnosis Date  . History of chicken pox   . History of gestational diabetes   . History of measles, mumps, or rubella   . No pertinent past medical history     Past Surgical History:  Procedure Laterality Date  . BREAST CYST EXCISION  2006  . CESAREAN SECTION    . TUBAL LIGATION      There were no vitals filed for this visit.   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 Yes    Pain Score 2     Pain Location Neck                             [facility] Adult PT Treatment/Exercise - 03/21/20 0001      Neck Exercises: Machines for Strengthening   UBE (Upper Arm Bike) L 3 3 fwd/3bkwd    Nustep UE only L 3 4 min    Cybex Row 25# 2x15    Cybex Chest Press 10# 2 sets 15    Lat Pull 25# 2 sets 15    Other Machines for Strengthening triceps 25#, biceps 15# 2x15    Other Machines for Strengthening red tabnd 3 pt stab on wall 10 each      Neck Exercises: Seated   Other Seated Exercise bent over row 6#, reverse flies and extensions 3#   2 sets 10     Manual Therapy   Manual Therapy Passive ROM;Neural Stretch    Passive ROM cervical PROM all motions right shoulder all motions    Neural Stretch median nerve glides, some assist in ulnar glides                    PT Short Term Goals - 10/27/19 0947      PT  SHORT TERM GOAL #1   Title independent with initial HEP    Time 2    Period Weeks    Status Achieved             PT Long Term Goals - 03/21/20 1650      PT LONG TERM GOAL #1   Title Improve posture and alignment with patient to demonstrate improve upright posture    Status Achieved      PT LONG TERM GOAL #2   Title increase cervical ROM 25%    Status Achieved      PT LONG TERM GOAL #3   Title understand posture and body mechanics for ADL's and work    Status Achieved      PT LONG TERM GOAL #4   Title decrease pain 50% with lifting    Status Partially Met      PT  LONG TERM GOAL #5   Title report no difficulty getting dressed and doing hair    Status Partially Met                 Plan - 03/21/20 1651    Clinical Impression Statement 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 progressed postural strengthening ex and pt tolerated well- just fatigue    PT Treatment/Interventions ADLs/Self Care Home Management;Electrical Stimulation;Moist Heat;Ultrasound;Therapeutic activities;Therapeutic exercise;Manual techniques;Dry needling;Patient/family education;Cryotherapy    PT Next Visit Plan we will focus on our plan working toward a return to work           Patient will benefit from skilled therapeutic intervention in order to improve the following deficits and impairments:  Pain,Improper body mechanics,Increased muscle spasms,Postural dysfunction,Decreased range of motion,Decreased strength,Impaired flexibility,Decreased activity tolerance,Cardiopulmonary status limiting activity,Decreased endurance  Visit Diagnosis: Other muscle spasm  Abnormal posture  Cervicalgia     Problem List Patient Active Problem List   Diagnosis Date Noted  . DDD (degenerative disc disease), cervical 04/05/2019  . Palpitations 08/03/2016  . Common migraine with intractable migraine 06/19/2016  . Vasovagal  near syncope 06/18/2016  . Pre-syncope 06/18/2016  . Dizziness 04/08/2012  . Anemia associated with acute blood loss--due to adhesions and C/S 11/14/2011  . Status post repeat low transverse cesarean section and BTL 11/11/2011  . Menstrual cycle disorder 07/09/2011  . Gestational diabetes mellitus in pregnancy 07/09/2011    Imo Cumbie,ANGIE PTA 03/21/2020, 4:53 PM  [place]. [place], [place], [address] Phone: [phone]   Fax:  [phone]  Name: [name] MRN: [id number] Date of Birth: 1975-10-30

## 2020-03-26 ENCOUNTER — Ambulatory Visit: Payer: 59 | Admitting: Physical Therapy

## 2020-03-26 ENCOUNTER — Ambulatory Visit: Payer: 59 | Admitting: Occupational Therapy

## 2020-03-26 ENCOUNTER — Encounter: Payer: 59 | Admitting: Physical Therapy

## 2020-03-29 ENCOUNTER — Ambulatory Visit: Payer: 59 | Admitting: Physical Therapy

## 2020-04-05 ENCOUNTER — Ambulatory Visit: Payer: 59 | Attending: Neurosurgery | Admitting: Physical Therapy

## 2020-04-05 ENCOUNTER — Encounter: Payer: Self-pay | Admitting: Physical Therapy

## 2020-04-05 ENCOUNTER — Other Ambulatory Visit: Payer: Self-pay

## 2020-04-05 DIAGNOSIS — M542 Cervicalgia: Secondary | ICD-10-CM | POA: Diagnosis not present

## 2020-04-05 DIAGNOSIS — R293 Abnormal posture: Secondary | ICD-10-CM

## 2020-04-05 DIAGNOSIS — M62838 Other muscle spasm: Secondary | ICD-10-CM | POA: Diagnosis not present

## 2020-04-05 NOTE — Therapy (Signed)
[place]. [place], [place], [address] Phone: [phone]   Fax:  [phone]  Physical Therapy Treatment  Patient Details  Name: [name] MRN: [id number] Date of Birth: 12/20/1975 Referring Provider (PT): Rico Ala Date: 04/05/2020   PT End of Session - 04/05/20 1055    Visit Number 23    Date for PT Re-Evaluation 03/24/20    PT Start Time 6270    PT Stop Time 1100    PT Time Calculation (min) 45 min    Activity Tolerance Patient tolerated treatment well    Behavior During Therapy [facility] for tasks assessed/performed           Past Medical History:  Diagnosis Date  . History of chicken pox   . History of gestational diabetes   . History of measles, mumps, or rubella   . No pertinent past medical history     Past Surgical History:  Procedure Laterality Date  . BREAST CYST EXCISION  2006  . CESAREAN SECTION    . TUBAL LIGATION      There were no vitals filed for this visit.   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    Currently in Pain? Yes    Pain Score 2     Pain Location Neck                             [facility] Adult PT Treatment/Exercise - 04/05/20 0001      Neck Exercises: Machines for Strengthening   UBE (Upper Arm Bike) L 2 2 fwd/ 2 bkwd    Nustep L 3 5 min    Cybex Row 25# 2x15    Cybex Chest Press 10# 2 sets 15    Lat Pull 25# 2 sets 15    Other Machines for Strengthening triceps 25#, biceps 15# 2x15      Neck Exercises: Standing   Other Standing Exercises 15lb functional vox lifts x5, Horiz abd red 2x10, ER red 2x10    Other Standing Exercises Functional box carry no weight x3,      Shoulder Exercises: Standing   Other Standing Exercises Shrugs 5lb 2x10                    PT Short Term Goals - 10/27/19 3500      PT SHORT TERM GOAL #1   Title  independent with initial HEP    Time 2    Period Weeks    Status Achieved             PT Long Term Goals - 03/21/20 1650      PT LONG TERM GOAL #1   Title Improve posture and alignment with patient to demonstrate improve upright posture    Status Achieved      PT LONG TERM GOAL #2   Title increase cervical ROM 25%    Status Achieved      PT LONG TERM GOAL #3   Title understand posture and body mechanics for ADL's and work    Status Achieved      PT LONG TERM GOAL #4   Title decrease pain 50% with lifting    Status Partially Met      PT LONG TERM GOAL #5   Title report no difficulty getting  dressed and doing hair    Status Partially Met                 Plan - 04/05/20 1107    Clinical Impression Statement Pt stated she need strengthening upon entering clinic. Pt did well completing the activities. Added some functional box carries and lift to session. Pt did report some chest tightness with functional lifts. after box lifts pt performed horizontal shoulder abduction to provide stretch and scapular retraction. Pt elected to use tens and heat at home.    Rehab Potential Good    PT Frequency 2x / week    PT Duration 4 weeks    PT Treatment/Interventions ADLs/Self Care Home Management;Electrical Stimulation;Moist Heat;Ultrasound;Therapeutic activities;Therapeutic exercise;Manual techniques;Dry needling;Patient/family education;Cryotherapy    PT Next Visit Plan we will focus on our plan working toward a return to work           Patient will benefit from skilled therapeutic intervention in order to improve the following deficits and impairments:  Pain,Improper body mechanics,Increased muscle spasms,Postural dysfunction,Decreased range of motion,Decreased strength,Impaired flexibility,Decreased activity tolerance,Cardiopulmonary status limiting activity,Decreased endurance  Visit Diagnosis: Abnormal posture  Other muscle spasm  Cervicalgia     Problem  List Patient Active Problem List   Diagnosis Date Noted  . DDD (degenerative disc disease), cervical 04/05/2019  . Palpitations 08/03/2016  . Common migraine with intractable migraine 06/19/2016  . Vasovagal near syncope 06/18/2016  . Pre-syncope 06/18/2016  . Dizziness 04/08/2012  . Anemia associated with acute blood loss--due to adhesions and C/S 11/14/2011  . Status post repeat low transverse cesarean section and BTL 11/11/2011  . Menstrual cycle disorder 07/09/2011  . Gestational diabetes mellitus in pregnancy 07/09/2011    Scot Jun, PTA 04/05/2020, 11:15 AM  [place]. [place], [place], [address] Phone: [phone]   Fax:  [phone]  Name: [name] MRN: [id number] Date of Birth: [birth year]

## 2020-04-12 ENCOUNTER — Ambulatory Visit: Payer: 59 | Admitting: Physical Therapy

## 2020-04-12 ENCOUNTER — Other Ambulatory Visit: Payer: Self-pay

## 2020-04-12 ENCOUNTER — Encounter: Payer: Self-pay | Admitting: Physical Therapy

## 2020-04-12 DIAGNOSIS — R293 Abnormal posture: Secondary | ICD-10-CM

## 2020-04-12 DIAGNOSIS — M542 Cervicalgia: Secondary | ICD-10-CM

## 2020-04-12 DIAGNOSIS — M62838 Other muscle spasm: Secondary | ICD-10-CM | POA: Diagnosis not present

## 2020-04-12 NOTE — Therapy (Signed)
[facility] [address] [place], [place], [address] Phone: [phone]   Fax:  [phone]  Physical Therapy Treatment  Patient Details  Name: [name] MRN: [id number] Date of Birth: May 29, 1975 Referring Provider (PT): Wynetta Emery   Encounter Date: 04/12/2020   PT End of Session - 04/12/20 1140    Visit Number 24    Number of Visits 12    Date for PT Re-Evaluation 03/24/20    PT Start Time 1057    PT Stop Time 1141    PT Time Calculation (min) 44 min    Activity Tolerance Patient tolerated treatment well    Behavior During Therapy [facility] for tasks assessed/performed           Past Medical History:  Diagnosis Date   History of chicken pox    History of gestational diabetes    History of measles, mumps, or rubella    No pertinent past medical history     Past Surgical History:  Procedure Laterality Date   BREAST CYST EXCISION  2006   CESAREAN SECTION     TUBAL LIGATION      There were no vitals filed for this visit.   Subjective Assessment - 04/12/20 1100    Subjective Have a OTC patch on her neck now to help her sleep. Feeling better    Currently in Pain? Yes    Pain Score 1     Pain Location Neck                             [facility] Adult PT Treatment/Exercise - 04/12/20 0001      Neck Exercises: Machines for Strengthening   UBE (Upper Arm Bike) L 2.1 3 fwd/ 3 bkwd    Cybex Row 35# 2x10    Cybex Chest Press 10# 2 sets 15    Lat Pull 35# x10 25lb x10      Neck Exercises: Standing   Neck Retraction 20 reps;3 secs    Wall Push Ups 20 reps    Other Standing Exercises OHP 3lb 2x10    Other Standing Exercises Trap stretch 2x10      Neck Exercises: Seated   Other Seated Exercise bent over row 6#, reverse flies and extensions 3#      Shoulder Exercises: Standing   Flexion Weights;20 reps;Both;Strengthening    ABduction Weights;20 reps;Strengthening;Both    Other Standing Exercises  Shrugs 8lb 2x10    Other Standing Exercises Horiz Abd green 2x10                    PT Short Term Goals - 10/27/19 0947      PT SHORT TERM GOAL #1   Title independent with initial HEP    Time 2    Period Weeks    Status Achieved             PT Long Term Goals - 04/12/20 1147      PT LONG TERM GOAL #1   Title Improve posture and alignment with patient to demonstrate improve upright posture    Status Achieved      PT LONG TERM GOAL #2   Title increase cervical ROM 25%    Status Achieved      PT LONG TERM GOAL #3   Title understand posture and body mechanics for ADL's and work    Status Achieved      PT LONG  TERM GOAL #4   Title decrease pain 50% with lifting    Status Partially Met      PT LONG TERM GOAL #5   Title report no difficulty getting dressed and doing hair    Status Partially Met                 Plan - 04/12/20 1142    Clinical Impression Statement Pt continues to do well in therapy, she does report the occasional tightness across her chest with some activities. increase weight tolerated with machine level interventions but had a struggle with lat pull downs. UE fatigue noted with OHP. Cues for arm placement needed with seated rows.    Rehab Potential Good    PT Frequency 2x / week    PT Treatment/Interventions ADLs/Self Care Home Management;Electrical Stimulation;Moist Heat;Ultrasound;Therapeutic activities;Therapeutic exercise;Manual techniques;Dry needling;Patient/family education;Cryotherapy    PT Next Visit Plan we will focus on our plan working toward a return to work           Patient will benefit from skilled therapeutic intervention in order to improve the following deficits and impairments:  Pain,Improper body mechanics,Increased muscle spasms,Postural dysfunction,Decreased range of motion,Decreased strength,Impaired flexibility,Decreased activity tolerance,Cardiopulmonary status limiting activity,Decreased endurance  Visit  Diagnosis: Other muscle spasm  Cervicalgia  Abnormal posture     Problem List Patient Active Problem List   Diagnosis Date Noted   DDD (degenerative disc disease), cervical 04/05/2019   Palpitations 08/03/2016   Common migraine with intractable migraine 06/19/2016   Vasovagal near syncope 06/18/2016   Pre-syncope 06/18/2016   Dizziness 04/08/2012   Anemia associated with acute blood loss--due to adhesions and C/S 11/14/2011   Status post repeat low transverse cesarean section and BTL 11/11/2011   Menstrual cycle disorder 07/09/2011   Gestational diabetes mellitus in pregnancy 07/09/2011    Scot Jun, PTA 04/12/2020, 11:47 AM  [place]. [place], [place], [address] Phone: [phone]   Fax:  [phone]  Name: [name] MRN: [id number] Date of Birth: 1975/06/11

## 2020-04-19 ENCOUNTER — Ambulatory Visit: Payer: 59 | Admitting: Physical Therapy

## 2020-04-19 ENCOUNTER — Other Ambulatory Visit: Payer: Self-pay

## 2020-04-19 DIAGNOSIS — M542 Cervicalgia: Secondary | ICD-10-CM | POA: Diagnosis not present

## 2020-04-19 DIAGNOSIS — R293 Abnormal posture: Secondary | ICD-10-CM | POA: Diagnosis not present

## 2020-04-19 DIAGNOSIS — M62838 Other muscle spasm: Secondary | ICD-10-CM

## 2020-04-19 NOTE — Therapy (Signed)
[place]. [place], [place], [address] Phone: [phone]   Fax:  [phone]  Physical Therapy Treatment  Patient Details  Name: [name] MRN: [id number] Date of Birth: [birth year] Referring Provider (PT): Rico Ala Date: 04/19/2020   PT End of Session - 04/19/20 0833    Visit Number 25    Date for PT Re-Evaluation 03/24/20    PT Start Time 2536    PT Stop Time 0832    PT Time Calculation (min) 35 min           Past Medical History:  Diagnosis Date  . History of chicken pox   . History of gestational diabetes   . History of measles, mumps, or rubella   . No pertinent past medical history     Past Surgical History:  Procedure Laterality Date  . BREAST CYST EXCISION  2006  . CESAREAN SECTION    . TUBAL LIGATION      There were no vitals filed for this visit.   Subjective Assessment - 04/19/20 0759    Subjective no real changes in pain but feeling alittle stronger- still cant heavy lift but doing more. Dr Saintclair Halsted 3/17    Currently in Pain? Yes    Pain Score 1     Pain Location Neck                             [facility] Adult PT Treatment/Exercise - 04/19/20 0001      Neck Exercises: Machines for Strengthening   UBE (Upper Arm Bike) L 4 3 min fwd/3 min back    Cybex Row 35# 2x10    Cybex Chest Press 10# 2 sets 15    Lat Pull 35# x10 25lb x10      Neck Exercises: Standing   Other Standing Exercises 5# pulley  shld ext,row and hors abd 2 set s10   tried 10# but unable to do   Other Standing Exercises 5# bicep curl 2 sets 10, 3# shld flex,upright row, abd, PNF, chest press and OH   2 sets 10     Neck Exercises: Seated   Other Seated Exercise bent over row 6#, reverse flies and extensions 3#   2 sets 10                 PT Education - 04/19/20 0801    Education Details reviewed tband and free wt ex for HEP [id number]    Person(s) Educated Patient    Methods  Explanation;Demonstration    Comprehension Verbalized understanding;Returned demonstration            PT Short Term Goals - 10/27/19 0947      PT SHORT TERM GOAL #1   Title independent with initial HEP    Time 2    Period Weeks    Status Achieved             PT Long Term Goals - 04/19/20 6440      PT LONG TERM GOAL #1   Title Improve posture and alignment with patient to demonstrate improve upright posture    Status Achieved      PT LONG TERM GOAL #2   Title increase cervical ROM 25%    Status Achieved      PT LONG TERM GOAL #3   Title understand posture and body mechanics for ADL's and work    Status  Achieved      PT LONG TERM GOAL #4   Title decrease pain 50% with lifting    Baseline pt reports 30-40%    Status Partially Met      PT LONG TERM GOAL #5   Title report no difficulty getting dressed and doing hair    Status Partially Met                 Plan - 04/19/20 0833    Clinical Impression Statement continue with strengthening and added free wt HEP. adjusted wts d/t weakness, chest pressure and compensations. progressing with LTGs    PT Treatment/Interventions ADLs/Self Care Home Management;Electrical Stimulation;Moist Heat;Ultrasound;Therapeutic activities;Therapeutic exercise;Manual techniques;Dry needling;Patient/family education;Cryotherapy    PT Next Visit Plan we will focus on our plan working toward a return to work           Patient will benefit from skilled therapeutic intervention in order to improve the following deficits and impairments:  Pain,Improper body mechanics,Increased muscle spasms,Postural dysfunction,Decreased range of motion,Decreased strength,Impaired flexibility,Decreased activity tolerance,Cardiopulmonary status limiting activity,Decreased endurance  Visit Diagnosis: Cervicalgia  Abnormal posture  Other muscle spasm     Problem List Patient Active Problem List   Diagnosis Date Noted  . DDD (degenerative disc  disease), cervical 04/05/2019  . Palpitations 08/03/2016  . Common migraine with intractable migraine 06/19/2016  . Vasovagal near syncope 06/18/2016  . Pre-syncope 06/18/2016  . Dizziness 04/08/2012  . Anemia associated with acute blood loss--due to adhesions and C/S 11/14/2011  . Status post repeat low transverse cesarean section and BTL 11/11/2011  . Menstrual cycle disorder 07/09/2011  . Gestational diabetes mellitus in pregnancy 07/09/2011    [name],[name] 04/19/2020, 8:34 AM  [place]. [place], [place], [address] Phone: [phone]   Fax:  [phone]  Name: [name] MRN: [id number] Date of Birth: Dec 11, 1975

## 2020-04-26 ENCOUNTER — Ambulatory Visit: Payer: 59 | Attending: Neurosurgery | Admitting: Physical Therapy

## 2020-04-26 ENCOUNTER — Other Ambulatory Visit: Payer: Self-pay

## 2020-04-26 ENCOUNTER — Encounter: Payer: Self-pay | Admitting: Physical Therapy

## 2020-04-26 DIAGNOSIS — M542 Cervicalgia: Secondary | ICD-10-CM | POA: Insufficient documentation

## 2020-04-26 DIAGNOSIS — R293 Abnormal posture: Secondary | ICD-10-CM | POA: Insufficient documentation

## 2020-04-26 DIAGNOSIS — M62838 Other muscle spasm: Secondary | ICD-10-CM | POA: Insufficient documentation

## 2020-04-26 NOTE — Therapy (Signed)
[facility]. [place], [place], [address] Phone: [phone]   Fax:  [phone]  Physical Therapy Treatment  Patient Details  Name: [name] MRN: [id number] Date of Birth: September 01, 1975 Referring Provider (PT): Rico Ala Date: 04/26/2020   PT End of Session - 04/26/20 0841    Visit Number 26    PT Start Time 0804    PT Stop Time 0845    PT Time Calculation (min) 41 min    Activity Tolerance Patient tolerated treatment well    Behavior During Therapy [facility] for tasks assessed/performed           Past Medical History:  Diagnosis Date  . History of chicken pox   . History of gestational diabetes   . History of measles, mumps, or rubella   . No pertinent past medical history     Past Surgical History:  Procedure Laterality Date  . BREAST CYST EXCISION  2006  . CESAREAN SECTION    . TUBAL LIGATION      There were no vitals filed for this visit.   Subjective Assessment - 04/26/20 0806    Subjective "About the same"    Currently in Pain? Yes    Pain Score 2                              [facility] Adult PT Treatment/Exercise - 04/26/20 0001      Neck Exercises: Machines for Strengthening   UBE (Upper Arm Bike) L 4 3 min fwd/3 min back    Cybex Row 35# 2x10    Cybex Chest Press 15# 3 sets 10    Lat Pull 35# x10 25lb x15      Neck Exercises: Standing   Other Standing Exercises Tricep Ext 20lb 2x10    Other Standing Exercises 10# bicep 2x10      Shoulder Exercises: Seated   Other Seated Exercises 3lb OHP 2x10    Other Seated Exercises Sit to stand OHP yellow ball 2x10      Shoulder Exercises: Standing   Horizontal ABduction Theraband;Strengthening;20 reps;Both    Theraband Level (Shoulder Horizontal ABduction) Level 4 (Blue)    External Rotation Strengthening;Both;20 reps;Theraband    Theraband Level (Shoulder External Rotation) Level 3 (Green)    Flexion Weights;20  reps;Both;Strengthening    Shoulder Flexion Weight (lbs) 5    ABduction Weights;20 reps;Strengthening;Both    Shoulder ABduction Weight (lbs) 4                    PT Short Term Goals - 10/27/19 0947      PT SHORT TERM GOAL #1   Title independent with initial HEP    Time 2    Period Weeks    Status Achieved             PT Long Term Goals - 04/26/20 0807      PT LONG TERM GOAL #4   Title decrease pain 50% with lifting    Status Partially Met      PT LONG TERM GOAL #5   Title report no difficulty getting dressed and doing hair    Status Partially Met                 Plan - 04/26/20 0841    Clinical Impression Statement Cardiovascular and muscular fatigue noted with today's session. Increase weight and or reps tolerated  with seated rows and lats. No reports of increase pain. Seated OHP was taxing on PT. STS with OHP caused increase fatigue.    Rehab Potential Good    PT Frequency 2x / week    PT Duration 4 weeks    PT Treatment/Interventions ADLs/Self Care Home Management;Electrical Stimulation;Moist Heat;Ultrasound;Therapeutic activities;Therapeutic exercise;Manual techniques;Dry needling;Patient/family education;Cryotherapy    PT Next Visit Plan we will focus on our plan working toward a return to work           Patient will benefit from skilled therapeutic intervention in order to improve the following deficits and impairments:  Pain,Improper body mechanics,Increased muscle spasms,Postural dysfunction,Decreased range of motion,Decreased strength,Impaired flexibility,Decreased activity tolerance,Cardiopulmonary status limiting activity,Decreased endurance  Visit Diagnosis: Cervicalgia  Other muscle spasm  Abnormal posture     Problem List Patient Active Problem List   Diagnosis Date Noted  . DDD (degenerative disc disease), cervical 04/05/2019  . Palpitations 08/03/2016  . Common migraine with intractable migraine 06/19/2016  . Vasovagal  near syncope 06/18/2016  . Pre-syncope 06/18/2016  . Dizziness 04/08/2012  . Anemia associated with acute blood loss--due to adhesions and C/S 11/14/2011  . Status post repeat low transverse cesarean section and BTL 11/11/2011  . Menstrual cycle disorder 07/09/2011  . Gestational diabetes mellitus in pregnancy 07/09/2011    Scot Jun 04/26/2020, 8:45 AM  [facility] [place]. [place], [place], [address] Phone: [phone]   Fax:  [phone]  Name: [name] MRN: [id number] Date of Birth: September 11, 1975

## 2020-04-29 DIAGNOSIS — M7918 Myalgia, other site: Secondary | ICD-10-CM | POA: Diagnosis not present

## 2020-04-29 DIAGNOSIS — M79602 Pain in left arm: Secondary | ICD-10-CM | POA: Diagnosis not present

## 2020-04-29 DIAGNOSIS — M542 Cervicalgia: Secondary | ICD-10-CM | POA: Diagnosis not present

## 2020-04-29 DIAGNOSIS — M961 Postlaminectomy syndrome, not elsewhere classified: Secondary | ICD-10-CM | POA: Diagnosis not present

## 2020-04-29 DIAGNOSIS — Z79899 Other long term (current) drug therapy: Secondary | ICD-10-CM | POA: Diagnosis not present

## 2020-05-03 ENCOUNTER — Ambulatory Visit: Payer: 59 | Admitting: Physical Therapy

## 2020-05-10 ENCOUNTER — Ambulatory Visit: Payer: 59 | Admitting: Physical Therapy

## 2020-05-10 ENCOUNTER — Other Ambulatory Visit: Payer: Self-pay

## 2020-05-17 ENCOUNTER — Encounter: Payer: 59 | Admitting: Physical Therapy

## 2020-05-24 ENCOUNTER — Encounter: Payer: 59 | Admitting: Physical Therapy

## 2020-06-05 ENCOUNTER — Ambulatory Visit: Payer: 59 | Attending: Neurosurgery | Admitting: Physical Therapy

## 2020-06-05 ENCOUNTER — Encounter: Payer: Self-pay | Admitting: Physical Therapy

## 2020-06-05 ENCOUNTER — Other Ambulatory Visit: Payer: Self-pay

## 2020-06-05 DIAGNOSIS — R293 Abnormal posture: Secondary | ICD-10-CM | POA: Diagnosis not present

## 2020-06-05 DIAGNOSIS — M62838 Other muscle spasm: Secondary | ICD-10-CM | POA: Insufficient documentation

## 2020-06-05 DIAGNOSIS — M542 Cervicalgia: Secondary | ICD-10-CM | POA: Diagnosis not present

## 2020-06-05 NOTE — Therapy (Signed)
[place]. [place], [place], [address] Phone: [phone]   Fax:  [phone]  Physical Therapy Treatment  Patient Details  Name: [name] MRN: [id number] Date of Birth: 05-19-1975 Referring Provider (PT): Saintclair Halsted   Encounter Date: 06/05/2020   PT End of Session - 06/05/20 1502    Visit Number 27    Authorization Type 8 of 25 visits with [employer] for this calendar year    PT Start Time 1435    PT Stop Time 1515    PT Time Calculation (min) 40 min    Activity Tolerance Patient tolerated treatment well    Behavior During Therapy [facility] for tasks assessed/performed           Past Medical History:  Diagnosis Date  . History of chicken pox   . History of gestational diabetes   . History of measles, mumps, or rubella   . No pertinent past medical history     Past Surgical History:  Procedure Laterality Date  . BREAST CYST EXCISION  2006  . CESAREAN SECTION    . TUBAL LIGATION      There were no vitals filed for this visit.   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  She does not feel that she can go back to the floor and doing patient lifting.  She is to see the surgeon tomorrow    Currently in Pain? Yes    Pain Score 2     Pain Location Neck    Pain Descriptors / Indicators Tightness    Pain Radiating Towards patient reports occasional left upper arm and wrist tingling and pain    Aggravating Factors  reports that after some of the PT treatments she gets fatigue and pain, she reports that over the past month the worst pain she has had is cleaning the house pain up to 6-8/10    Pain Relieving Factors pain is always a 2/10 with rest    Effect of Pain on Daily Activities reports difficulty with cleaning at home, she reports no real increase in pain at the call center where she has  been working              [facility] PT Assessment - 06/05/20 0001      AROM   Overall AROM Comments Cervical ROM decreased 25% for flexion, extension, bilateral rotation all decreased 25% with c/o tightness, side bending is decreased 50% with tightness    Right Shoulder Flexion 170 Degrees    Right Shoulder Internal Rotation 80 Degrees    Right Shoulder External Rotation 85 Degrees      Strength   Overall Strength Comments Strength 4+/5 for ER, 5/5 for IR, Flexion and abduction 4+/5, some pain iwth MMT, pain was in the neck area      Palpation   Palpation comment some tightness in the upper traps but feels very tight in the cervical parapsinals                                 PT Education - 06/05/20 1459    Education Details We have reviewed all HEP, posture and body mechanics for her job and lifting, we discussed the issues of how to move patients and lift, her issues  remain that with lifting patients or other objects she cintinues to report pain in the neck area    Person(s) Educated Patient    Methods Explanation;Demonstration;Verbal cues;Tactile cues    Comprehension Verbalized understanding;Returned demonstration            PT Short Term Goals - 10/27/19 0947      PT SHORT TERM GOAL #1   Title independent with initial HEP    Time 2    Period Weeks    Status Achieved             PT Long Term Goals - 06/05/20 1514      PT LONG TERM GOAL #1   Title Improve posture and alignment with patient to demonstrate improve upright posture    Status Achieved      PT LONG TERM GOAL #2   Title increase cervical ROM 25%    Status Achieved      PT LONG TERM GOAL #3   Title understand posture and body mechanics for ADL's and work    Status Achieved      PT LONG TERM GOAL #4   Title decrease pain 50% with lifting    Status Not Met      PT LONG TERM GOAL #5   Title report no difficulty getting dressed and doing hair    Status Partially Met                  Plan - 06/05/20 1504    Clinical Impression Statement Patient has had some issues with her insurance requiring authorization, she got a bill so we did not see her for a few times trying to help her out with assuring that she was not going to get a bill and giving the insurance all of the paperwork on why we saw the patient, as the patient was told it was by medical necessity by the insurance.  She has been working 20 hours a week in a [employer] center, she reports doing well with this and without any increase of pain.  we have gone over all HEP, posture and body mechanics for lifting and return to work [employer] he floor, the issues that remain are:  She has some limitation in cervical ROM, her strength is good but with lifting here 35# for rows and lat pulls, up to 15# on the chest press, during the exercises she would fatigue but was able to tolerate this, however the issue was after she would report "pressure and pain" in the neck and chest, she also would have the same type pain and c/o with cleaning and moving furniture at home the pain would increase to a 6-8/10.  The mms in the neck are very tight and the ROM is limited.    PT Next Visit Plan She is to see the surgeon tomorrow, I recommend we hold treatment as she has seemed to reach a plateau    Consulted and Agree with Plan of Care Patient           Patient will benefit from skilled therapeutic intervention in order to improve the following deficits and impairments:  Pain,Improper body mechanics,Increased muscle spasms,Postural dysfunction,Decreased range of motion,Decreased strength,Impaired flexibility,Decreased activity tolerance,Cardiopulmonary status limiting activity,Decreased endurance  Visit Diagnosis: Cervicalgia  Other muscle spasm  Abnormal posture     Problem List Patient Active Problem List   Diagnosis Date Noted  . DDD (degenerative disc disease), cervical 04/05/2019  . Palpitations 08/03/2016  . Common  migraine  with intractable migraine 06/19/2016  . Vasovagal near syncope 06/18/2016  . Pre-syncope 06/18/2016  . Dizziness 04/08/2012  . Anemia associated with acute blood loss--due to adhesions and C/S 11/14/2011  . Status post repeat low transverse cesarean section and BTL 11/11/2011  . Menstrual cycle disorder 07/09/2011  . Gestational diabetes mellitus in pregnancy 07/09/2011    Sumner Boast., PT 06/05/2020, 3:15 PM  [facility]. [place], [place], [address] Phone: [phone]   Fax:  [phone]  Name: [name] MRN: [id number] Date of Birth: 05/07/1975

## 2020-06-06 DIAGNOSIS — R030 Elevated blood-pressure reading, without diagnosis of hypertension: Secondary | ICD-10-CM | POA: Diagnosis not present

## 2020-06-06 DIAGNOSIS — M542 Cervicalgia: Secondary | ICD-10-CM | POA: Diagnosis not present

## 2020-07-23 DIAGNOSIS — M7918 Myalgia, other site: Secondary | ICD-10-CM | POA: Diagnosis not present

## 2020-07-23 DIAGNOSIS — M961 Postlaminectomy syndrome, not elsewhere classified: Secondary | ICD-10-CM | POA: Diagnosis not present

## 2020-07-23 DIAGNOSIS — M542 Cervicalgia: Secondary | ICD-10-CM | POA: Diagnosis not present

## 2020-09-10 ENCOUNTER — Other Ambulatory Visit (HOSPITAL_COMMUNITY): Payer: Self-pay | Admitting: Student

## 2020-09-10 ENCOUNTER — Other Ambulatory Visit: Payer: Self-pay | Admitting: Student

## 2020-09-10 DIAGNOSIS — M25552 Pain in left hip: Secondary | ICD-10-CM | POA: Diagnosis not present

## 2020-09-10 DIAGNOSIS — M5412 Radiculopathy, cervical region: Secondary | ICD-10-CM | POA: Diagnosis not present

## 2020-09-10 DIAGNOSIS — M542 Cervicalgia: Secondary | ICD-10-CM | POA: Diagnosis not present

## 2020-09-20 ENCOUNTER — Other Ambulatory Visit: Payer: Self-pay

## 2020-09-20 ENCOUNTER — Ambulatory Visit (HOSPITAL_COMMUNITY)
Admission: RE | Admit: 2020-09-20 | Discharge: 2020-09-20 | Disposition: A | Discharge status: Home / Self Care | Payer: 59 | Source: Ambulatory Visit | Attending: Student | Admitting: Student

## 2020-09-20 DIAGNOSIS — M542 Cervicalgia: Secondary | ICD-10-CM

## 2020-09-20 DIAGNOSIS — D259 Leiomyoma of uterus, unspecified: Secondary | ICD-10-CM | POA: Diagnosis not present

## 2020-09-20 DIAGNOSIS — Z981 Arthrodesis status: Secondary | ICD-10-CM | POA: Diagnosis not present

## 2020-09-20 DIAGNOSIS — M4322 Fusion of spine, cervical region: Secondary | ICD-10-CM | POA: Diagnosis not present

## 2020-09-20 DIAGNOSIS — M25552 Pain in left hip: Secondary | ICD-10-CM | POA: Diagnosis not present

## 2020-10-08 DIAGNOSIS — M7918 Myalgia, other site: Secondary | ICD-10-CM | POA: Diagnosis not present

## 2021-02-28 NOTE — Progress Notes (Addendum)
[place] at [facility] [place], [place], [place] [address] [phone]  Date:  03/05/2021   Name:  [name]   DOB:  Oct 30, 1975   MRN:  [id number]  PCP:  Darreld Mclean, MD    Chief Complaint: Annual Exam (Concerns/ questions: still having neck pain/Flu shot: received in October/Pap: pt says she has her period today.)   History of Present Illness:  [name] is a 45 y.o. 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  She is working once weekly right now, overnight   Pap- now due, she has her menses so she will do this later on  Covid booster Flu vaccine- done  Update blood work today  [place] now due - she is scheduled for after christmas   She is doing exercise at home- walking, lifting is still limited due to her neck   Patient Active Problem List   Diagnosis Date Noted   DDD (degenerative disc disease), cervical 04/05/2019   Palpitations 08/03/2016   Common migraine with intractable migraine 06/19/2016   Vasovagal near syncope 06/18/2016   Pre-syncope 06/18/2016   Dizziness 04/08/2012   Anemia associated with acute blood loss--due to adhesions and C/S 11/14/2011   Status post repeat low transverse cesarean section and BTL 11/11/2011   Menstrual cycle disorder 07/09/2011   Gestational diabetes mellitus in pregnancy 07/09/2011    Past Medical History:  Diagnosis Date   History of chicken pox    History of gestational diabetes    History of measles, mumps, or rubella    No pertinent past medical history     Past Surgical History:  Procedure Laterality Date   BREAST CYST EXCISION  2006    CESAREAN SECTION     TUBAL LIGATION      Social History   Tobacco Use   Smoking status: Never   Smokeless tobacco: Never  Vaping Use   Vaping Use: Never used  Substance Use Topics   Alcohol use: Yes    Comment: Rarely- red wine   Drug use: No    Family History  Problem Relation Age of Onset   Cancer Mother        cervical   Diabetes Mother    Hypertension Mother    Arthritis Other    Diabetes Other    Hyperlipidemia Other    Hypertension Other    Coronary artery disease Maternal Grandfather    Asthma Brother    Cancer Maternal Aunt        colon    No Known Allergies  Medication list has been reviewed and updated.  Current Outpatient Medications on File Prior to Visit  Medication Sig Dispense Refill   acetaminophen (TYLENOL) 500 MG tablet Take 500 mg by mouth every 6 (six) hours as needed.     ibuprofen (ADVIL) 200 MG tablet Take 200 mg by mouth every 6 (six) hours as needed.     vitamin C (ASCORBIC ACID) 500 MG tablet Take 500 mg by mouth daily.     No current facility-administered medications on file prior to visit.    Review of Systems:  As per HPI- otherwise negative.   Physical Examination: Vitals:   03/05/21 1335  BP: 110/72  Pulse: 75  Resp: 18  Temp: 97.7 F (36.5 C)  SpO2: 100%   Vitals:   03/05/21 1335  Weight: 127 lb 3.2 oz (57.7 kg)  Height: 5\' 3"  (1.6 m)   Body mass index is 22.53 kg/m. Ideal Body Weight: Weight in (lb) to have BMI = 25: 140.8  GEN: no acute distress. Normal weight, looks well  HEENT: Atraumatic, Normocephalic.  Bilateral TM wnl, oropharynx normal.  PEERL,EOMI.  Thyroid is enlarged but no nodules on exam  Ears and Nose: No external deformity. CV: RRR, No M/G/R. No JVD. No thrill. No extra heart sounds. PULM: CTA B, no wheezes, crackles, rhonchi. No retractions. No resp. distress. No accessory muscle use. ABD: S, NT, ND, +BS. No rebound. No HSM. EXTR: No c/c/e PSYCH: Normally interactive. Conversant.     Assessment and Plan: Physical exam  Screening for hyperlipidemia - Plan: Lipid panel  Screening for diabetes mellitus - Plan: Comprehensive metabolic panel, Hemoglobin A1c  Screening for thyroid disorder - Plan: TSH  Screening for deficiency anemia - Plan: CBC  Fatigue, unspecified type - Plan: TSH, VITAMIN D 25 Hydroxy (Vit-D Deficiency, Fractures)  Elevated blood uric acid level - Plan: Uric acid  Thyroid enlargement - Plan: US THYROID  Physical exam today- encouraged healthy diet and exercise routine  Will plan further follow- up pending labs.  She notes her father was dx with colon cancer about 4 years ago- doing ok.  Advised that we will order a colonoscopy for her once she turns 45 yo  Mammo is scheduled She will come in for her pap when not menstruating     Signed Lamar Blinks, MD  Received her labs as below, message to patient  Results for orders placed or performed in visit on 03/05/21  CBC  Result Value Ref Range   WBC 10.5 4.0 - 10.5 K/uL   RBC 4.21 3.87 - 5.11 Mil/uL   Platelets 238.0 150.0 - 400.0 K/uL   Hemoglobin 12.5 12.0 - 15.0 g/dL   HCT 37.5 36.0 - 46.0 %   MCV 89.2 78.0 - 100.0 fl   MCHC 33.2 30.0 - 36.0 g/dL   RDW 13.5 11.5 - 15.5 %  Comprehensive metabolic panel  Result Value Ref Range   Sodium 135 135 - 145 mEq/L   Potassium 4.1 3.5 - 5.1 mEq/L   Chloride 102 96 - 112 mEq/L   CO2 26 19 - 32 mEq/L   Glucose, Bld 86 70 - 99 mg/dL   BUN 12 6 - 23 mg/dL   Creatinine, Ser 0.71 0.40 - 1.20 mg/dL   Total Bilirubin 0.7 0.2 - 1.2 mg/dL   Alkaline Phosphatase 46 39 - 117 U/L   AST 11 0 - 37 U/L   ALT 6 0 - 35 U/L   Total Protein 7.5 6.0 - 8.3 g/dL   Albumin 4.4 3.5 - 5.2 g/dL   GFR 103.22 >60.00 mL/min   Calcium 9.1 8.4 - 10.5 mg/dL  Hemoglobin A1c  Result Value Ref Range   Hgb A1c MFr Bld 5.3 4.6 - 6.5 %  Lipid panel  Result Value Ref Range   Cholesterol 188 0 - 200 mg/dL   Triglycerides 76.0 0.0 - 149.0 mg/dL   HDL 49.00  >39.00 mg/dL   VLDL 15.2 0.0 - 40.0 mg/dL   LDL Cholesterol 124 (H) 0 - 99 mg/dL   Total CHOL/HDL Ratio 4  NonHDL 139.47   TSH  Result Value Ref Range   TSH 1.24 0.35 - 5.50 uIU/mL  VITAMIN D 25 Hydroxy (Vit-D Deficiency, Fractures)  Result Value Ref Range   VITD 53.03 30.00 - 100.00 ng/mL  Uric acid  Result Value Ref Range   Uric Acid, Serum 5.7 2.4 - 7.0 mg/dL

## 2021-02-28 NOTE — Patient Instructions (Addendum)
Good to see you again today- I will be in touch with your labs asap  We can plan to order a colonoscopy for you once you turn 45 yo  Please see me for your pap at your convenience I would recommend getting the new covid vaccine at your convenience - pharmacy  We will order a thyroid US to be done at [facility]

## 2021-03-05 ENCOUNTER — Encounter: Payer: Self-pay | Admitting: Family Medicine

## 2021-03-05 ENCOUNTER — Ambulatory Visit (INDEPENDENT_AMBULATORY_CARE_PROVIDER_SITE_OTHER): Payer: 59 | Admitting: Family Medicine

## 2021-03-05 VITALS — BP 110/72 | HR 75 | Temp 97.7°F | Resp 18 | Ht 63.0 in | Wt 127.2 lb

## 2021-03-05 DIAGNOSIS — Z13220 Encounter for screening for lipoid disorders: Secondary | ICD-10-CM | POA: Diagnosis not present

## 2021-03-05 DIAGNOSIS — R5383 Other fatigue: Secondary | ICD-10-CM | POA: Diagnosis not present

## 2021-03-05 DIAGNOSIS — E790 Hyperuricemia without signs of inflammatory arthritis and tophaceous disease: Secondary | ICD-10-CM

## 2021-03-05 DIAGNOSIS — E049 Nontoxic goiter, unspecified: Secondary | ICD-10-CM

## 2021-03-05 DIAGNOSIS — Z130 Encounter for screening for diseases of the blood and blood-forming organs and certain disorders involving the immune mechanism: Secondary | ICD-10-CM | POA: Diagnosis not present

## 2021-03-05 DIAGNOSIS — Z1329 Encounter for screening for other suspected endocrine disorder: Secondary | ICD-10-CM | POA: Diagnosis not present

## 2021-03-05 DIAGNOSIS — Z131 Encounter for screening for diabetes mellitus: Secondary | ICD-10-CM | POA: Diagnosis not present

## 2021-03-05 DIAGNOSIS — Z0000 Encounter for general adult medical examination without abnormal findings: Secondary | ICD-10-CM

## 2021-03-05 LAB — COMPREHENSIVE METABOLIC PANEL
ALT: 6 U/L (ref 0–35)
AST: 11 U/L (ref 0–37)
Albumin: 4.4 g/dL (ref 3.5–5.2)
Alkaline Phosphatase: 46 U/L (ref 39–117)
BUN: 12 mg/dL (ref 6–23)
CO2: 26 mEq/L (ref 19–32)
Calcium: 9.1 mg/dL (ref 8.4–10.5)
Chloride: 102 mEq/L (ref 96–112)
Creatinine, Ser: 0.71 mg/dL (ref 0.40–1.20)
GFR: 103.22 mL/min (ref >60.00)
Glucose, Bld: 86 mg/dL (ref 70–99)
Potassium: 4.1 mEq/L (ref 3.5–5.1)
Sodium: 135 mEq/L (ref 135–145)
Total Bilirubin: 0.7 mg/dL (ref 0.2–1.2)
Total Protein: 7.5 g/dL (ref 6.0–8.3)

## 2021-03-05 LAB — CBC
HCT: 37.5 % (ref 36.0–46.0)
Hemoglobin: 12.5 g/dL (ref 12.0–15.0)
MCHC: 33.2 g/dL (ref 30.0–36.0)
MCV: 89.2 fl (ref 78.0–100.0)
Platelets: 238 10*3/uL (ref 150.0–400.0)
RBC: 4.21 Mil/uL (ref 3.87–5.11)
RDW: 13.5 % (ref 11.5–15.5)
WBC: 10.5 10*3/uL (ref 4.0–10.5)

## 2021-03-05 LAB — HEMOGLOBIN A1C: Hgb A1c MFr Bld: 5.3 % (ref 4.6–6.5)

## 2021-03-05 LAB — LIPID PANEL
Cholesterol: 188 mg/dL (ref 0–200)
HDL: 49 mg/dL (ref >39.00)
LDL Cholesterol: 124 mg/dL — ABNORMAL HIGH (ref 0–99)
NonHDL: 139.47
Total CHOL/HDL Ratio: 4
Triglycerides: 76 mg/dL (ref 0.0–149.0)
VLDL: 15.2 mg/dL (ref 0.0–40.0)

## 2021-03-05 LAB — VITAMIN D 25 HYDROXY (VIT D DEFICIENCY, FRACTURES): VITD: 53.03 ng/mL (ref 30.00–100.00)

## 2021-03-05 LAB — TSH: TSH: 1.24 u[IU]/mL (ref 0.35–5.50)

## 2021-03-05 LAB — URIC ACID: Uric Acid, Serum: 5.7 mg/dL (ref 2.4–7.0)

## 2021-03-06 ENCOUNTER — Ambulatory Visit
Admission: RE | Admit: 2021-03-06 | Discharge: 2021-03-06 | Disposition: A | Discharge status: Home / Self Care | Payer: 59 | Source: Ambulatory Visit | Attending: Family Medicine | Admitting: Family Medicine

## 2021-03-06 ENCOUNTER — Encounter: Payer: Self-pay | Admitting: Family Medicine

## 2021-03-06 DIAGNOSIS — E042 Nontoxic multinodular goiter: Secondary | ICD-10-CM | POA: Diagnosis not present

## 2021-03-06 DIAGNOSIS — E049 Nontoxic goiter, unspecified: Secondary | ICD-10-CM

## 2021-03-11 ENCOUNTER — Other Ambulatory Visit (HOSPITAL_BASED_OUTPATIENT_CLINIC_OR_DEPARTMENT_OTHER): Payer: Self-pay | Admitting: Family Medicine

## 2021-03-11 DIAGNOSIS — Z1231 Encounter for screening mammogram for malignant neoplasm of breast: Secondary | ICD-10-CM

## 2021-03-18 ENCOUNTER — Ambulatory Visit (HOSPITAL_BASED_OUTPATIENT_CLINIC_OR_DEPARTMENT_OTHER)
Admission: RE | Admit: 2021-03-18 | Discharge: 2021-03-18 | Disposition: A | Discharge status: Home / Self Care | Payer: 59 | Source: Ambulatory Visit | Attending: Family Medicine | Admitting: Family Medicine

## 2021-03-18 ENCOUNTER — Other Ambulatory Visit: Payer: Self-pay

## 2021-03-18 ENCOUNTER — Encounter (HOSPITAL_BASED_OUTPATIENT_CLINIC_OR_DEPARTMENT_OTHER): Payer: Self-pay

## 2021-03-18 DIAGNOSIS — Z1231 Encounter for screening mammogram for malignant neoplasm of breast: Secondary | ICD-10-CM | POA: Insufficient documentation

## 2021-05-09 ENCOUNTER — Encounter: Payer: Self-pay | Admitting: Family Medicine

## 2021-05-09 DIAGNOSIS — E041 Nontoxic single thyroid nodule: Secondary | ICD-10-CM

## 2021-06-16 ENCOUNTER — Other Ambulatory Visit (HOSPITAL_COMMUNITY)
Admission: RE | Admit: 2021-06-16 | Discharge: 2021-06-16 | Disposition: A | Discharge status: Home / Self Care | Payer: 59 | Source: Ambulatory Visit | Attending: Interventional Radiology | Admitting: Interventional Radiology

## 2021-06-16 ENCOUNTER — Ambulatory Visit
Admission: RE | Admit: 2021-06-16 | Discharge: 2021-06-16 | Disposition: A | Discharge status: Home / Self Care | Payer: 59 | Source: Ambulatory Visit | Attending: Family Medicine | Admitting: Family Medicine

## 2021-06-16 DIAGNOSIS — E041 Nontoxic single thyroid nodule: Secondary | ICD-10-CM

## 2021-06-17 LAB — CYTOLOGY - NON PAP

## 2021-10-13 ENCOUNTER — Other Ambulatory Visit: Payer: Self-pay

## 2021-12-24 ENCOUNTER — Telehealth: Payer: Self-pay | Admitting: Family Medicine

## 2021-12-24 NOTE — Telephone Encounter (Signed)
Pt triaged. 

## 2021-12-24 NOTE — Telephone Encounter (Signed)
LVM requesting call back to determine if patient needs to be triaged based on symptoms in mychart appt request. "Frequent headaches with nausea and intermittent arm pain"

## 2021-12-24 NOTE — Telephone Encounter (Signed)
Patient Name [name] Patient DOB [birth year] Call Type Message Only Information Provided Reason for Call Request to Schedule Office Appointment Initial Comment Caller states she just got a voicemail and a message in [place] that she cannot read. She was supposed to schedule for her annual check up Disp. Time Disposition Final User 12/24/2021 1:23:40 PM General Information Provided Yes [name] Call Closed By: [name] Transaction Date/Time: 12/24/2021 1:19:39 PM (ET)

## 2021-12-26 ENCOUNTER — Other Ambulatory Visit: Payer: Self-pay | Admitting: Family Medicine

## 2021-12-26 DIAGNOSIS — Z1231 Encounter for screening mammogram for malignant neoplasm of breast: Secondary | ICD-10-CM

## 2022-03-06 NOTE — Progress Notes (Unsigned)
[place] at [facility] [address] [place], [place] [address] [phone]  Date:  03/09/2022   Name:  Josue Kass   DOB:  March 21, 1976   MRN:  [id number]  PCP:  Darreld Mclean, MD    Chief Complaint: No chief complaint on file.   History of Present Illness:  Amor Elie Gragert is a 46 y.o. very pleasant female patient who presents with the following:  Patient seen today for physical exam Most recent visit with myself was about 1 year ago History of palpitations, gestational diabetes, migraine headache, cervical fusion per Dr Saintclair Halsted in 2021 She has 3 school-aged children, 67, 11, 10 She is a nurse in the cardiac ICU  Pap smear due for update Colon cancer screening Flu shot COVID booster  She had a thyroid nodule biopsy in March which was benign Lab work can be updated Patient Active Problem List   Diagnosis Date Noted   DDD (degenerative disc disease), cervical 04/05/2019   Palpitations 08/03/2016   Common migraine with intractable migraine 06/19/2016   Vasovagal near syncope 06/18/2016   Pre-syncope 06/18/2016   Dizziness 04/08/2012   Anemia associated with acute blood loss--due to adhesions and C/S 11/14/2011   Status post repeat low transverse cesarean section and BTL 11/11/2011   Menstrual cycle disorder 07/09/2011   Gestational diabetes mellitus in pregnancy 07/09/2011    Past Medical History:  Diagnosis Date   History of chicken pox    History of gestational diabetes    History of measles, mumps, or rubella    No pertinent past medical history     Past Surgical History:  Procedure Laterality Date   BREAST CYST EXCISION  2006   CESAREAN SECTION     TUBAL LIGATION      Social History   Tobacco Use   Smoking status: Never   Smokeless tobacco: Never  Vaping Use   Vaping Use: Never used  Substance Use Topics   Alcohol use: Yes    Comment: Rarely- red wine   Drug use: No    Family History   Problem Relation Age of Onset   Cancer Mother        cervical   Diabetes Mother    Hypertension Mother    Arthritis Other    Diabetes Other    Hyperlipidemia Other    Hypertension Other    Coronary artery disease Maternal Grandfather    Asthma Brother    Cancer Maternal Aunt        colon    No Known Allergies  Medication list has been reviewed and updated.  Current Outpatient Medications on File Prior to Visit  Medication Sig Dispense Refill   acetaminophen (TYLENOL) 500 MG tablet Take 500 mg by mouth every 6 (six) hours as needed.     ibuprofen (ADVIL) 200 MG tablet Take 200 mg by mouth every 6 (six) hours as needed.     vitamin C (ASCORBIC ACID) 500 MG tablet Take 500 mg by mouth daily.     No current facility-administered medications on file prior to visit.    Review of Systems:  As per HPI- otherwise negative.    Physical Examination: There were no vitals filed for this visit. There were no vitals filed for this visit. There is no height or weight on file to calculate BMI. Ideal Body Weight:    GEN: no acute distress. HEENT: Atraumatic, Normocephalic.  Ears and Nose: No external deformity.  CV: RRR, No M/G/R. No JVD. No thrill. No extra heart sounds. PULM: CTA B, no wheezes, crackles, rhonchi. No retractions. No resp. distress. No accessory muscle use. ABD: S, NT, ND, +BS. No rebound. No HSM. EXTR: No c/c/e PSYCH: Normally interactive. Conversant.    Assessment and Plan: *** Physical exam today.  Encouraged healthy diet and exercise routine Will plan further follow- up pending labs.  Signed Lamar Blinks, MD

## 2022-03-06 NOTE — Patient Instructions (Incomplete)
It was great to see you again today, I will be in touch with your lab results as soon as possible

## 2022-03-09 ENCOUNTER — Ambulatory Visit (INDEPENDENT_AMBULATORY_CARE_PROVIDER_SITE_OTHER): Payer: 59 | Admitting: Family Medicine

## 2022-03-09 ENCOUNTER — Other Ambulatory Visit (HOSPITAL_COMMUNITY)
Admission: RE | Admit: 2022-03-09 | Discharge: 2022-03-09 | Disposition: A | Discharge status: Home / Self Care | Payer: 59 | Source: Ambulatory Visit | Attending: Family Medicine | Admitting: Family Medicine

## 2022-03-09 ENCOUNTER — Other Ambulatory Visit (HOSPITAL_BASED_OUTPATIENT_CLINIC_OR_DEPARTMENT_OTHER): Payer: Self-pay

## 2022-03-09 ENCOUNTER — Encounter: Payer: Self-pay | Admitting: Family Medicine

## 2022-03-09 ENCOUNTER — Telehealth: Payer: Self-pay

## 2022-03-09 VITALS — BP 112/80 | HR 70 | Temp 97.7°F | Resp 18 | Ht 63.0 in | Wt 132.8 lb

## 2022-03-09 DIAGNOSIS — Z1211 Encounter for screening for malignant neoplasm of colon: Secondary | ICD-10-CM

## 2022-03-09 DIAGNOSIS — Z0000 Encounter for general adult medical examination without abnormal findings: Secondary | ICD-10-CM | POA: Diagnosis not present

## 2022-03-09 DIAGNOSIS — Z131 Encounter for screening for diabetes mellitus: Secondary | ICD-10-CM

## 2022-03-09 DIAGNOSIS — R5383 Other fatigue: Secondary | ICD-10-CM

## 2022-03-09 DIAGNOSIS — Z124 Encounter for screening for malignant neoplasm of cervix: Secondary | ICD-10-CM | POA: Insufficient documentation

## 2022-03-09 DIAGNOSIS — Z130 Encounter for screening for diseases of the blood and blood-forming organs and certain disorders involving the immune mechanism: Secondary | ICD-10-CM

## 2022-03-09 DIAGNOSIS — E559 Vitamin D deficiency, unspecified: Secondary | ICD-10-CM | POA: Diagnosis not present

## 2022-03-09 DIAGNOSIS — Z13220 Encounter for screening for lipoid disorders: Secondary | ICD-10-CM

## 2022-03-09 DIAGNOSIS — G43709 Chronic migraine without aura, not intractable, without status migrainosus: Secondary | ICD-10-CM

## 2022-03-09 DIAGNOSIS — Z1329 Encounter for screening for other suspected endocrine disorder: Secondary | ICD-10-CM

## 2022-03-09 LAB — COMPREHENSIVE METABOLIC PANEL
ALT: 9 U/L (ref 0–35)
AST: 11 U/L (ref 0–37)
Albumin: 4.5 g/dL (ref 3.5–5.2)
Alkaline Phosphatase: 50 U/L (ref 39–117)
BUN: 10 mg/dL (ref 6–23)
CO2: 25 mEq/L (ref 19–32)
Calcium: 9 mg/dL (ref 8.4–10.5)
Chloride: 106 mEq/L (ref 96–112)
Creatinine, Ser: 0.74 mg/dL (ref 0.40–1.20)
GFR: 97.52 mL/min (ref >60.00)
Glucose, Bld: 103 mg/dL — ABNORMAL HIGH (ref 70–99)
Potassium: 4.2 mEq/L (ref 3.5–5.1)
Sodium: 139 mEq/L (ref 135–145)
Total Bilirubin: 0.5 mg/dL (ref 0.2–1.2)
Total Protein: 7.4 g/dL (ref 6.0–8.3)

## 2022-03-09 LAB — CBC
HCT: 35.7 % — ABNORMAL LOW (ref 36.0–46.0)
Hemoglobin: 12.2 g/dL (ref 12.0–15.0)
MCHC: 34.3 g/dL (ref 30.0–36.0)
MCV: 87.2 fl (ref 78.0–100.0)
Platelets: 261 10*3/uL (ref 150.0–400.0)
RBC: 4.09 Mil/uL (ref 3.87–5.11)
RDW: 13.4 % (ref 11.5–15.5)
WBC: 7.6 10*3/uL (ref 4.0–10.5)

## 2022-03-09 LAB — VITAMIN D 25 HYDROXY (VIT D DEFICIENCY, FRACTURES): VITD: 47.95 ng/mL (ref 30.00–100.00)

## 2022-03-09 LAB — LIPID PANEL
Cholesterol: 208 mg/dL — ABNORMAL HIGH (ref 0–200)
HDL: 56.7 mg/dL (ref >39.00)
LDL Cholesterol: 131 mg/dL — ABNORMAL HIGH (ref 0–99)
NonHDL: 150.9
Total CHOL/HDL Ratio: 4
Triglycerides: 99 mg/dL (ref 0.0–149.0)
VLDL: 19.8 mg/dL (ref 0.0–40.0)

## 2022-03-09 LAB — TSH: TSH: 0.97 u[IU]/mL (ref 0.35–5.50)

## 2022-03-09 LAB — HEMOGLOBIN A1C: Hgb A1c MFr Bld: 5.3 % (ref 4.6–6.5)

## 2022-03-09 MED ORDER — NURTEC 75 MG PO TBDP
75.0000 mg | ORAL_TABLET | ORAL | 3 refills | Status: DC
  Filled 2022-03-09: qty 8, 16d supply, fill #0
  Filled 2022-03-27: qty 8, 16d supply, fill #1
  Filled 2022-04-12: qty 8, 16d supply, fill #2
  Filled 2022-04-27: qty 8, 16d supply, fill #3
  Filled 2022-05-12 – 2022-05-25 (×2): qty 8, 16d supply, fill #4
  Filled 2022-06-08: qty 8, 16d supply, fill #5
  Filled 2022-06-24: qty 8, 16d supply, fill #6
  Filled 2022-07-09: qty 8, 16d supply, fill #7
  Filled 2022-07-29: qty 8, 16d supply, fill #8
  Filled 2022-08-11: qty 8, 16d supply, fill #9

## 2022-03-09 NOTE — Telephone Encounter (Signed)
PA approved.   The request has been approved. The authorization is effective from 03/09/2022 to 09/07/2022, as long as the member is enrolled in their current health plan. The request was approved with a quantity restriction. This has been approved for a max daily dosage of 0.53. A written notification letter will follow with additional details.

## 2022-03-09 NOTE — Telephone Encounter (Signed)
PA initiated via Covermymeds; KEY: [id number]. Awaiting determination.

## 2022-03-10 ENCOUNTER — Encounter: Payer: Self-pay | Admitting: Family Medicine

## 2022-03-10 ENCOUNTER — Other Ambulatory Visit (HOSPITAL_BASED_OUTPATIENT_CLINIC_OR_DEPARTMENT_OTHER): Payer: Self-pay

## 2022-03-10 LAB — CYTOLOGY - PAP
Adequacy: ABSENT
Comment: NEGATIVE
Diagnosis: NEGATIVE
High risk HPV: NEGATIVE

## 2022-03-18 DIAGNOSIS — Z1231 Encounter for screening mammogram for malignant neoplasm of breast: Secondary | ICD-10-CM

## 2022-03-19 ENCOUNTER — Ambulatory Visit
Admission: RE | Admit: 2022-03-19 | Discharge: 2022-03-19 | Disposition: A | Discharge status: Home / Self Care | Payer: 59 | Source: Ambulatory Visit

## 2022-03-19 DIAGNOSIS — Z1231 Encounter for screening mammogram for malignant neoplasm of breast: Secondary | ICD-10-CM

## 2022-03-24 ENCOUNTER — Encounter: Payer: Self-pay | Admitting: Internal Medicine

## 2022-04-14 ENCOUNTER — Other Ambulatory Visit (HOSPITAL_BASED_OUTPATIENT_CLINIC_OR_DEPARTMENT_OTHER): Payer: Self-pay

## 2022-04-14 ENCOUNTER — Ambulatory Visit (AMBULATORY_SURGERY_CENTER): Payer: Commercial Managed Care - PPO | Admitting: *Deleted

## 2022-04-14 VITALS — Ht 64.0 in | Wt 132.0 lb

## 2022-04-14 DIAGNOSIS — Z1211 Encounter for screening for malignant neoplasm of colon: Secondary | ICD-10-CM

## 2022-04-14 MED ORDER — NA SULFATE-K SULFATE-MG SULF 17.5-3.13-1.6 GM/177ML PO SOLN
1.0000 | Freq: Once | ORAL | 0 refills | Status: AC
  Filled 2022-04-14: qty 354, 1d supply, fill #0

## 2022-04-14 NOTE — Progress Notes (Signed)

## 2022-04-17 ENCOUNTER — Other Ambulatory Visit: Payer: Self-pay

## 2022-04-24 ENCOUNTER — Encounter: Payer: Self-pay | Admitting: Internal Medicine

## 2022-05-05 ENCOUNTER — Encounter: Payer: Self-pay | Admitting: Internal Medicine

## 2022-05-05 ENCOUNTER — Ambulatory Visit (AMBULATORY_SURGERY_CENTER): Payer: Commercial Managed Care - PPO | Admitting: Internal Medicine

## 2022-05-05 VITALS — BP 119/82 | HR 69 | Temp 97.5°F | Resp 13 | Ht 64.0 in | Wt 132.0 lb

## 2022-05-05 DIAGNOSIS — Z800 Family history of malignant neoplasm of digestive organs: Secondary | ICD-10-CM | POA: Diagnosis not present

## 2022-05-05 DIAGNOSIS — Z1211 Encounter for screening for malignant neoplasm of colon: Secondary | ICD-10-CM

## 2022-05-05 MED ORDER — SODIUM CHLORIDE 0.9 % IV SOLN: 500.0000 mL | Freq: Once | INTRAVENOUS | Status: DC

## 2022-05-05 NOTE — Progress Notes (Signed)
HISTORY OF PRESENT ILLNESS:  [name] is a 47 y.o. female presents for routine screening colonoscopy.  Higher than baseline risk as her father had colon cancer in his 12s  REVIEW OF SYSTEMS:  All non-GI ROS negative except for  Past Medical History:  Diagnosis Date   Blood transfusion without reported diagnosis    after birth of child 2013   H/O cesarean section    c sections x3   History of chicken pox    History of gestational diabetes    History of measles, mumps, or rubella    No pertinent past medical history     Past Surgical History:  Procedure Laterality Date   BREAST CYST EXCISION Right 03/23/2004   CERVICAL FUSION     c4-c7,,may 2021   CESAREAN SECTION     right breast cystectomy Right    benign   TUBAL LIGATION      Social History [name]  reports that she has never smoked. She has never used smokeless tobacco. She reports current alcohol use. She reports that she does not use drugs.  family history includes Arthritis in an other family member; Asthma in her brother; Cancer in her maternal aunt and mother; Colon cancer in her father; Coronary artery disease in her maternal grandfather; Diabetes in her mother and another family member; Hyperlipidemia in an other family member; Hypertension in her mother and another family member.  No Known Allergies     PHYSICAL EXAMINATION: Vital signs: BP 111/63   Pulse 75   Temp (!) 97.5 F (36.4 C) (Skin)   Ht 5' 4"$  (1.626 m)   Wt 132 lb (59.9 kg)   LMP 03/26/2022 (Approximate) Comment: pt says no chance of pregnancy ,had sterilization 2013  SpO2 100%   BMI 22.66 kg/m  General: Well-developed, well-nourished, no acute distress HEENT: Sclerae are anicteric, conjunctiva pink. Oral mucosa intact Lungs: Clear Heart: Regular Abdomen: soft, nontender, nondistended, no obvious ascites, no peritoneal signs, normal bowel sounds. No organomegaly. Extremities: No edema Psychiatric: alert and oriented x3.  Cooperative     ASSESSMENT:  Colon cancer screening  Family history of colon cancer  PLAN:  Screening colonoscopy

## 2022-05-05 NOTE — Op Note (Signed)
[place] Patient Name: [name] Procedure Date: 05/05/2022 10:54 AM MRN: [id number] Endoscopist: Docia Chuck. Henrene Pastor , MD, [id number] Age: 47 Referring MD:  Date of Birth: April 02, 1975 Gender: Female Account #: [id number] Procedure:                Colonoscopy Indications:              Screening in patient at increased risk: Colorectal                            cancer in father 36's Medicines:                Monitored Anesthesia Care Procedure:                Pre-Anesthesia Assessment:                           - Prior to the procedure, a History and Physical                            was performed, and patient medications and                            allergies were reviewed. The patient's tolerance of                            previous anesthesia was also reviewed. The risks                            and benefits of the procedure and the sedation                            options and risks were discussed with the patient.                            All questions were answered, and informed consent                            was obtained. Prior Anticoagulants: The patient has                            taken no anticoagulant or antiplatelet agents. ASA                            Grade Assessment: I - A normal, healthy patient.                            After reviewing the risks and benefits, the patient                            was deemed in satisfactory condition to undergo the                            procedure.  After obtaining informed consent, the colonoscope                            was passed under direct vision. Throughout the                            procedure, the patient's blood pressure, pulse, and                            oxygen saturations were monitored continuously. The                            Olympus CF-HQ190L ([id number]) Colonoscope was                            introduced through the anus and advanced to the  the                            cecum, identified by appendiceal orifice and                            ileocecal valve. The ileocecal valve, appendiceal                            orifice, and rectum were photographed. The quality                            of the bowel preparation was excellent. The                            colonoscopy was performed without difficulty. The                            patient tolerated the procedure well. The bowel                            preparation used was SUPREP via split dose                            instruction. Scope In: 11:08:52 AM Scope Out: 11:20:06 AM Scope Withdrawal Time: 0 hours 8 minutes 31 seconds  Total Procedure Duration: 0 hours 11 minutes 14 seconds  Findings:                 Internal hemorrhoids were found during retroflexion.                           The exam was otherwise without abnormality on                            direct and retroflexion views. Complications:            No immediate complications. Estimated blood loss:                            None.  Estimated Blood Loss:     Estimated blood loss: none. Impression:               - Internal hemorrhoids.                           - The examination was otherwise normal on direct                            and retroflexion views.                           - No specimens collected. Recommendation:           - Repeat colonoscopy in 5 years for screening                            purposes (family history).                           - Patient has a contact number available for                            emergencies. The signs and symptoms of potential                            delayed complications were discussed with the                            patient. Return to normal activities tomorrow.                            Written discharge instructions were provided to the                            patient.                           - Resume previous diet.                            - Continue present medications. Docia Chuck. Henrene Pastor, MD 05/05/2022 11:28:27 AM This report has been signed electronically.

## 2022-05-05 NOTE — Progress Notes (Signed)
Vss nad trans to pacu °

## 2022-05-05 NOTE — Patient Instructions (Signed)
YOU HAD AN ENDOSCOPIC PROCEDURE TODAY AT [facility]:   Refer to the procedure report that was given to you for any specific questions about what was found during the examination.  If the procedure report does not answer your questions, please call your gastroenterologist to clarify.  If you requested that your care partner not be given the details of your procedure findings, then the procedure report has been included in a sealed envelope for you to review at your convenience later.  **Handout given on hemorrhoids**  YOU SHOULD EXPECT: Some feelings of bloating in the abdomen. Passage of more gas than usual.  Walking can help get rid of the air that was put into your GI tract during the procedure and reduce the bloating. If you had a lower endoscopy (such as a colonoscopy or flexible sigmoidoscopy) you may notice spotting of blood in your stool or on the toilet paper. If you underwent a bowel prep for your procedure, you may not have a normal bowel movement for a few days.  Please Note:  You might notice some irritation and congestion in your nose or some drainage.  This is from the oxygen used during your procedure.  There is no need for concern and it should clear up in a day or so.  SYMPTOMS TO REPORT IMMEDIATELY:  Following lower endoscopy (colonoscopy or flexible sigmoidoscopy):  Excessive amounts of blood in the stool  Significant tenderness or worsening of abdominal pains  Swelling of the abdomen that is new, acute  Fever of 100F or higher  For urgent or emergent issues, a gastroenterologist can be reached at any hour by calling [phone]. Do not use MyChart messaging for urgent concerns.    DIET:  We do recommend a small meal at first, but then you may proceed to your regular diet.  Drink plenty of fluids but you should avoid alcoholic beverages for 24 hours.  ACTIVITY:  You should plan to take it easy for the rest of today and you should NOT DRIVE or use heavy  machinery until tomorrow (because of the sedation medicines used during the test).    FOLLOW UP: Our staff will call the number listed on your records the next business day following your procedure.  We will call around 7:15- 8:00 am to check on you and address any questions or concerns that you may have regarding the information given to you following your procedure. If we do not reach you, we will leave a message.     If any biopsies were taken you will be contacted by phone or by letter within the next 1-3 weeks.  Please call us at [phone] if you have not heard about the biopsies in 3 weeks.    SIGNATURES/CONFIDENTIALITY: You and/or your care partner have signed paperwork which will be entered into your electronic medical record.  These signatures attest to the fact that that the information above on your After Visit Summary has been reviewed and is understood.  Full responsibility of the confidentiality of this discharge information lies with you and/or your care-partner.

## 2022-05-05 NOTE — Progress Notes (Signed)
Pt's states no medical or surgical changes since previsit or office visit. 

## 2022-05-06 ENCOUNTER — Telehealth: Payer: Self-pay | Admitting: *Deleted

## 2022-05-06 NOTE — Telephone Encounter (Signed)
  Follow up Call-     05/05/2022   10:37 AM  Call back number  Post procedure Call Back phone  # [phone]  Permission to leave phone message Yes     Patient questions:  Do you have a fever, pain , or abdominal swelling? No. Pain Score  0 *  Have you tolerated food without any problems? Yes.    Have you been able to return to your normal activities? Yes.    Do you have any questions about your discharge instructions: Diet   No. Medications  No. Follow up visit  No.  Do you have questions or concerns about your Care? No.  Actions: * If pain score is 4 or above: No action needed, pain <4.

## 2022-05-22 ENCOUNTER — Other Ambulatory Visit (HOSPITAL_BASED_OUTPATIENT_CLINIC_OR_DEPARTMENT_OTHER): Payer: Self-pay

## 2022-05-25 ENCOUNTER — Other Ambulatory Visit (HOSPITAL_BASED_OUTPATIENT_CLINIC_OR_DEPARTMENT_OTHER): Payer: Self-pay

## 2022-06-03 ENCOUNTER — Other Ambulatory Visit (HOSPITAL_BASED_OUTPATIENT_CLINIC_OR_DEPARTMENT_OTHER): Payer: Self-pay

## 2022-06-26 ENCOUNTER — Other Ambulatory Visit (HOSPITAL_BASED_OUTPATIENT_CLINIC_OR_DEPARTMENT_OTHER): Payer: Self-pay

## 2022-08-06 ENCOUNTER — Ambulatory Visit (HOSPITAL_BASED_OUTPATIENT_CLINIC_OR_DEPARTMENT_OTHER)
Admission: RE | Admit: 2022-08-06 | Discharge: 2022-08-06 | Disposition: A | Discharge status: Home / Self Care | Payer: Commercial Managed Care - PPO | Source: Ambulatory Visit | Attending: Family Medicine | Admitting: Family Medicine

## 2022-08-06 ENCOUNTER — Other Ambulatory Visit (HOSPITAL_BASED_OUTPATIENT_CLINIC_OR_DEPARTMENT_OTHER): Payer: Self-pay

## 2022-08-06 ENCOUNTER — Encounter: Payer: Self-pay | Admitting: Family Medicine

## 2022-08-06 ENCOUNTER — Ambulatory Visit: Payer: Commercial Managed Care - PPO | Admitting: Family Medicine

## 2022-08-06 VITALS — BP 108/76 | HR 96 | Temp 98.3°F | Resp 16 | Ht 63.0 in | Wt 130.5 lb

## 2022-08-06 DIAGNOSIS — R051 Acute cough: Secondary | ICD-10-CM

## 2022-08-06 DIAGNOSIS — R509 Fever, unspecified: Secondary | ICD-10-CM | POA: Diagnosis not present

## 2022-08-06 DIAGNOSIS — R059 Cough, unspecified: Secondary | ICD-10-CM | POA: Diagnosis not present

## 2022-08-06 DIAGNOSIS — R7989 Other specified abnormal findings of blood chemistry: Secondary | ICD-10-CM | POA: Diagnosis not present

## 2022-08-06 DIAGNOSIS — R0602 Shortness of breath: Secondary | ICD-10-CM | POA: Diagnosis not present

## 2022-08-06 LAB — D-DIMER, QUANTITATIVE: D-Dimer, Quant: 0.83 mcg/mL FEU — ABNORMAL HIGH (ref <0.50)

## 2022-08-06 LAB — POCT INFLUENZA A/B
Influenza A, POC: NEGATIVE
Influenza B, POC: NEGATIVE

## 2022-08-06 LAB — POCT RAPID STREP A (OFFICE): Rapid Strep A Screen: NEGATIVE

## 2022-08-06 MED ORDER — AMOXICILLIN 500 MG PO CAPS
1000.0000 mg | ORAL_CAPSULE | Freq: Two times a day (BID) | ORAL | 0 refills | Status: DC
Start: 2022-08-06 — End: 2023-07-25
  Filled 2022-08-06: qty 40, 10d supply, fill #0

## 2022-08-06 MED ORDER — IOHEXOL 350 MG/ML SOLN
75.0000 mL | Freq: Once | INTRAVENOUS | Status: AC | PRN
  Administered 2022-08-06: 75 mL via INTRAVENOUS

## 2022-08-06 MED ORDER — DOXYCYCLINE HYCLATE 100 MG PO CAPS
100.0000 mg | ORAL_CAPSULE | Freq: Two times a day (BID) | ORAL | 0 refills | Status: DC
Start: 2022-08-06 — End: 2023-07-28
  Filled 2022-08-06: qty 20, 10d supply, fill #0

## 2022-08-06 NOTE — Progress Notes (Addendum)
[facility] [address] [place], [place] [address] [phone]  Date:  08/06/2022   Name:  [name]   DOB:  Jun 08, 1975   MRN:  [id number]  PCP:  Pearline Cables, MD    Chief Complaint: Cough (X1 week, body aches, nasal congestion, sore throat, /2 neg covid tests, highest 101)   History of Present Illness:  [name] is a 47 y.o. very pleasant female patient who presents with the following:  Most recent visit with myself was in [place] is generally in good health Patient seen today with concern of fevers- started 8 days ago Fever, ST, nasal congestion and body aches No CP but her chest feels "tight" She tested negative for covid the day after sx started  She started feeling better but then worsened the last 2 days-  She had a fever to 101 yesterday, ST, PND, chest congestion She does have some cough No vomiting or diarrhea, no dysuria She does have headache  She tested for covid last night- still negative   One of her daughters is sick with similar sx   History of palpitations, gestational diabetes, migraine headache, cervical fusion per Dr Wynetta Emery in 2021 She has 3 school-aged children, 14, 11, 10 She is a nurse in the cardiac ICU History of BTL and she is menstruating now-no suspicion of pregnancy Patient Active Problem List   Diagnosis Date Noted   DDD (degenerative disc disease), cervical 04/05/2019   Palpitations 08/03/2016   Common migraine with intractable migraine 06/19/2016   Vasovagal near syncope 06/18/2016   Pre-syncope 06/18/2016   Dizziness 04/08/2012   Anemia associated with acute blood loss--due to adhesions and C/S 11/14/2011   Status post repeat low transverse cesarean section and BTL 11/11/2011   Menstrual cycle disorder 07/09/2011   Gestational diabetes mellitus in pregnancy 07/09/2011    Past Medical History:  Diagnosis Date   Blood transfusion without reported  diagnosis    after birth of child 2013   H/O cesarean section    c sections x3   History of chicken pox    History of gestational diabetes    History of measles, mumps, or rubella    No pertinent past medical history     Past Surgical History:  Procedure Laterality Date   BREAST CYST EXCISION Right 03/23/2004   CERVICAL FUSION     c4-c7,,may 2021   CESAREAN SECTION     right breast cystectomy Right    benign   TUBAL LIGATION      Social History   Tobacco Use   Smoking status: Never   Smokeless tobacco: Never  Vaping Use   Vaping Use: Never used  Substance Use Topics   Alcohol use: Yes    Comment: Rarely- red wine   Drug use: No    Family History  Problem Relation Age of Onset   Cancer Mother        cervical   Diabetes Mother    Hypertension Mother    Colon cancer Father        age 53   Asthma Brother    Cancer Maternal Aunt        colon   Coronary artery disease Maternal Grandfather    Arthritis Other    Diabetes Other    Hyperlipidemia Other    Hypertension Other    Colon polyps Neg Hx    Crohn's disease Neg Hx  Esophageal cancer Neg Hx    Rectal cancer Neg Hx    Stomach cancer Neg Hx    Ulcerative colitis Neg Hx     No Known Allergies  Medication list has been reviewed and updated.  Current Outpatient Medications on File Prior to Visit  Medication Sig Dispense Refill   acetaminophen (TYLENOL) 500 MG tablet Take 500 mg by mouth every 6 (six) hours as needed.     Cholecalciferol (VITAMIN D-3 PO) Take by mouth. Take one daily     KRILL OIL PO Take by mouth.     Rimegepant Sulfate (NURTEC) 75 MG TBDP Take 75 mg by mouth every other day. 30 tablet 3   vitamin C (ASCORBIC ACID) 500 MG tablet Take 500 mg by mouth daily.     ibuprofen (ADVIL) 200 MG tablet Take 200 mg by mouth every 6 (six) hours as needed. (Patient not taking: Reported on 08/06/2022)     No current facility-administered medications on file prior to visit.    Review of  Systems:  As per HPI- otherwise negative.  BP Readings from Last 3 Encounters:  08/06/22 108/76  05/05/22 119/82  03/09/22 112/80   Physical Examination: Vitals:   08/06/22 1358  BP: 108/76  Pulse: 96  Resp: 16  Temp: 98.3 F (36.8 C)  SpO2: 97%   Vitals:   08/06/22 1358  Weight: 130 lb 8 oz (59.2 kg)  Height: 5\' 3"  (1.6 m)   Body mass index is 23.12 kg/m. Ideal Body Weight: Weight in (lb) to have BMI = 25: 140.8  GEN: no acute distress. Normal weight, does not appear to feel great but non- toxic  HEENT: Atraumatic, Normocephalic.  Bilateral TM wnl, oropharynx normal.  PEERL,EOMI.   Ears and Nose: No external deformity. CV: RRR, No M/G/R. No JVD. No thrill. No extra heart sounds. PULM: CTA B, no wheezes, crackles, rhonchi. No retractions. No resp. distress. No accessory muscle use. ABD: S, NT, ND EXTR: No c/c/e PSYCH: Normally interactive. Conversant.   Results for orders placed or performed in visit on 08/06/22  D-Dimer, Quantitative  Result Value Ref Range   D-Dimer, Quant 0.83 (H) <0.50 mcg/mL FEU  POCT Influenza A/B  Result Value Ref Range   Influenza A, POC Negative Negative   Influenza B, POC Negative Negative  POCT rapid strep A  Result Value Ref Range   Rapid Strep A Screen Negative Negative    DG Chest 2 View  Result Date: 08/06/2022 CLINICAL DATA:  Cough and fever EXAM: CHEST - 2 VIEW COMPARISON:  01/16/2019 FINDINGS: Frontal and lateral views of the chest demonstrate an unremarkable cardiac silhouette. No acute airspace disease, effusion, or pneumothorax. No acute bony abnormalities. IMPRESSION: 1. No acute intrathoracic process. Electronically Signed   By: Sharlet Salina M.D.   On: 08/06/2022 15:07    Assessment and Plan: Acute cough - Plan: CBC, Comprehensive metabolic panel, D-Dimer, Quantitative, DG Chest 2 View, POCT Influenza A/B, POCT rapid strep A, doxycycline (VIBRAMYCIN) 100 MG capsule, amoxicillin (AMOXIL) 500 MG capsule, CT Angio Chest  W/Cm &/Or Wo Cm  Low grade fever - Plan: CBC, Comprehensive metabolic panel, D-Dimer, Quantitative, DG Chest 2 View, POCT Influenza A/B, POCT rapid strep A, doxycycline (VIBRAMYCIN) 100 MG capsule, amoxicillin (AMOXIL) 500 MG capsule, CT Angio Chest W/Cm &/Or Wo Cm Patient seen today with concern of cough, upper respiratory symptoms, low-grade fevers.  She has tested for COVID twice, she was negative still as of yesterday.  Negative for flu and strep here  today.  We obtained a chest film which is negative.  However, given her symptoms we decided to start on antibiotics and cover for possible community-acquired pneumonia and also tickborne illness.  Will treat her with doxycycline and amoxicillin.  Provided a work note through Monday.  She will let me know if not improving over the next few days, sooner if getting worse Will seek care if any distress I also have a D-dimer and CBC, c-Met pending-will respond to results as they become available Signed Abbe Amsterdam, MD  Received her D dimer which is positive unfortunately- called pt, she is agreeable to going ahead with CT angiogram which we have arranged for this evening  Received CT angio which was negative, called pt and let her know  Received labs 5/17- message to pt  Results for orders placed or performed in visit on 08/06/22  CBC  Result Value Ref Range   WBC 9.2 4.0 - 10.5 K/uL   RBC 4.42 3.87 - 5.11 Mil/uL   Platelets 285.0 150.0 - 400.0 K/uL   Hemoglobin 13.1 12.0 - 15.0 g/dL   HCT 09.8 11.9 - 14.7 %   MCV 86.8 78.0 - 100.0 fl   MCHC 34.1 30.0 - 36.0 g/dL   RDW 82.9 56.2 - 13.0 %  Comprehensive metabolic panel  Result Value Ref Range   Sodium 136 135 - 145 mEq/L   Potassium 4.3 3.5 - 5.1 mEq/L   Chloride 100 96 - 112 mEq/L   CO2 27 19 - 32 mEq/L   Glucose, Bld 92 70 - 99 mg/dL   BUN 11 6 - 23 mg/dL   Creatinine, Ser 8.65 0.40 - 1.20 mg/dL   Total Bilirubin 0.7 0.2 - 1.2 mg/dL   Alkaline Phosphatase 63 39 - 117 U/L   AST 14  0 - 37 U/L   ALT 13 0 - 35 U/L   Total Protein 8.2 6.0 - 8.3 g/dL   Albumin 4.5 3.5 - 5.2 g/dL   GFR 78.46 >96.29 mL/min   Calcium 9.3 8.4 - 10.5 mg/dL  D-Dimer, Quantitative  Result Value Ref Range   D-Dimer, Quant 0.83 (H) <0.50 mcg/mL FEU  POCT Influenza A/B  Result Value Ref Range   Influenza A, POC Negative Negative   Influenza B, POC Negative Negative  POCT rapid strep A  Result Value Ref Range   Rapid Strep A Screen Negative Negative

## 2022-08-06 NOTE — Patient Instructions (Signed)
I will be in touch with your chest x-ray and D dimer asap, and other labs tomorrow. Once we get your chest x-ray back we can decide on treatment.

## 2022-08-06 NOTE — Addendum Note (Signed)
Addended by: Abbe Amsterdam C on: 08/06/2022 04:40 PM   Modules accepted: Orders

## 2022-08-07 ENCOUNTER — Encounter: Payer: Self-pay | Admitting: Family Medicine

## 2022-08-07 LAB — CBC
HCT: 38.4 % (ref 36.0–46.0)
Hemoglobin: 13.1 g/dL (ref 12.0–15.0)
MCHC: 34.1 g/dL (ref 30.0–36.0)
MCV: 86.8 fl (ref 78.0–100.0)
Platelets: 285 10*3/uL (ref 150.0–400.0)
RBC: 4.42 Mil/uL (ref 3.87–5.11)
RDW: 13.8 % (ref 11.5–15.5)
WBC: 9.2 10*3/uL (ref 4.0–10.5)

## 2022-08-07 LAB — COMPREHENSIVE METABOLIC PANEL
ALT: 13 U/L (ref 0–35)
AST: 14 U/L (ref 0–37)
Albumin: 4.5 g/dL (ref 3.5–5.2)
Alkaline Phosphatase: 63 U/L (ref 39–117)
BUN: 11 mg/dL (ref 6–23)
CO2: 27 mEq/L (ref 19–32)
Calcium: 9.3 mg/dL (ref 8.4–10.5)
Chloride: 100 mEq/L (ref 96–112)
Creatinine, Ser: 0.82 mg/dL (ref 0.40–1.20)
GFR: 85.97 mL/min (ref >60.00)
Glucose, Bld: 92 mg/dL (ref 70–99)
Potassium: 4.3 mEq/L (ref 3.5–5.1)
Sodium: 136 mEq/L (ref 135–145)
Total Bilirubin: 0.7 mg/dL (ref 0.2–1.2)
Total Protein: 8.2 g/dL (ref 6.0–8.3)

## 2022-08-26 ENCOUNTER — Telehealth: Payer: Self-pay

## 2022-08-26 NOTE — Telephone Encounter (Signed)
PA initiated via Covermymeds; KEY: [id number]. Awaiting determination.

## 2022-08-28 ENCOUNTER — Encounter: Payer: Self-pay | Admitting: Family Medicine

## 2022-08-28 ENCOUNTER — Other Ambulatory Visit (HOSPITAL_BASED_OUTPATIENT_CLINIC_OR_DEPARTMENT_OTHER): Payer: Self-pay

## 2022-08-28 ENCOUNTER — Other Ambulatory Visit: Payer: Self-pay

## 2022-08-28 DIAGNOSIS — G43709 Chronic migraine without aura, not intractable, without status migrainosus: Secondary | ICD-10-CM

## 2022-08-28 MED ORDER — NURTEC 75 MG PO TBDP
75.0000 mg | ORAL_TABLET | ORAL | 3 refills | Status: DC
Start: 2022-08-28 — End: 2023-07-28
  Filled 2022-08-28: qty 16, 32d supply, fill #0
  Filled 2023-04-23: qty 16, 30d supply, fill #1
  Filled 2023-06-17: qty 16, 30d supply, fill #2
  Filled 2023-07-28: qty 16, 30d supply, fill #3

## 2022-08-28 NOTE — Telephone Encounter (Signed)
PA partially denied.   The request has been partially denied. The authorization is effective from 08/26/2022 to 08/25/2023, as long as the member is enrolled in their current health plan. We were asked to cover a larger amount of the drug listed at the top of this letter than allowed under our quantity limit rule. Your provider requested Nurtec ODT 75mg  tablets at a quantity of one (1) tablet per day, but your plan allows for a quantity of sixteen (16) Nurtec ODT 75mg  tablets per 30-day supply for the preventative treatment of episodic (having 0 to 14 headache days per month) migraines (headaches that cause intense, throbbing pain on one side or both sides of the head). The requested quantity of one (1) tablet per day is higher than the Food and Drug Administration (FDA) or drug manufacturer daily maximum recommendations for Nurtec ODT 75mg  tablets for the preventative treatment of episodic migraines, which is sixteen (16) Nurtec ODT 75mg  tablets per 30-day supply. The safety of using more than 18 doses in a 30-day period has not been established. You were approved for a lower quantity than requested based on our guideline named GENERAL QUANTITY EXCEPTION CRITERIA (reviewed for Nurtec ODT 75mg  tablets). In order for this request to be approved at the quantity requested, your has provided documentation that supports that the requested drug at the intended dosage for the specified indication is considered safe and effective by approved compendia (medical references such as Clinical Pharmacology, [place] [facility], and [employer] Lexi-Drugs), guidelines issued by leading nationally-recognized associations and agencies (such as the [employer] of Gastroenterology or the [employer]), or found in at least two (2) high-quality articles from major peer-reviewed medical journals (such as Lancet or The [place] Journal of Medicine). Please note that a prior  authorization has been approved for Nurtec ODT 75mg  tablets for 12 months with

## 2022-08-28 NOTE — Telephone Encounter (Signed)
#   16 sent to the pharmacy instead of #30.

## 2022-11-25 ENCOUNTER — Other Ambulatory Visit (HOSPITAL_BASED_OUTPATIENT_CLINIC_OR_DEPARTMENT_OTHER): Payer: Self-pay

## 2023-03-19 ENCOUNTER — Other Ambulatory Visit (HOSPITAL_BASED_OUTPATIENT_CLINIC_OR_DEPARTMENT_OTHER): Payer: Self-pay

## 2023-03-19 ENCOUNTER — Ambulatory Visit
Admission: EM | Admit: 2023-03-19 | Discharge: 2023-03-19 | Disposition: A | Discharge status: Home / Self Care | Payer: Federal, State, Local not specified - PPO | Attending: Family Medicine | Admitting: Family Medicine

## 2023-03-19 ENCOUNTER — Ambulatory Visit: Payer: Self-pay

## 2023-03-19 DIAGNOSIS — R059 Cough, unspecified: Secondary | ICD-10-CM | POA: Diagnosis not present

## 2023-03-19 DIAGNOSIS — R509 Fever, unspecified: Secondary | ICD-10-CM

## 2023-03-19 DIAGNOSIS — J101 Influenza due to other identified influenza virus with other respiratory manifestations: Secondary | ICD-10-CM

## 2023-03-19 LAB — POC INFLUENZA A AND B ANTIGEN (URGENT CARE ONLY)
Influenza A Ag: POSITIVE — AB
Influenza B Ag: NEGATIVE

## 2023-03-19 MED ORDER — BENZONATATE 200 MG PO CAPS
200.0000 mg | ORAL_CAPSULE | Freq: Three times a day (TID) | ORAL | 0 refills | Status: AC | PRN
  Filled 2023-03-19: qty 40, 14d supply, fill #0

## 2023-03-19 MED ORDER — PROMETHAZINE-DM 6.25-15 MG/5ML PO SYRP
5.0000 mL | ORAL_SOLUTION | Freq: Two times a day (BID) | ORAL | 0 refills | Status: AC | PRN
  Filled 2023-03-19: qty 118, 12d supply, fill #0

## 2023-03-19 MED ORDER — OSELTAMIVIR PHOSPHATE 75 MG PO CAPS
75.0000 mg | ORAL_CAPSULE | Freq: Two times a day (BID) | ORAL | 0 refills | Status: DC
  Filled 2023-03-19: qty 10, 5d supply, fill #0

## 2023-03-19 MED ORDER — PREDNISONE 50 MG PO TABS
50.0000 mg | ORAL_TABLET | Freq: Every day | ORAL | 0 refills | Status: DC
  Filled 2023-03-19: qty 5, 5d supply, fill #0

## 2023-03-19 NOTE — ED Triage Notes (Signed)
Pt c/o cough, fever and bodyaches since 12/23. Fever last night. Tmax was 101.7 on Wed night. Recently took care of pt with flu. Taking tylenol/ibuprofen and nurtec prn.

## 2023-03-19 NOTE — ED Provider Notes (Signed)
[facility]    CSN: [id number] Arrival date & time: 03/19/23  1106      History   Chief Complaint Chief Complaint  Patient presents with   Cough   Fever   Generalized Body Aches    HPI [name] is a 47 y.o. female.   HPI pleasant 47 year old female presents with cough, fever and bodyaches since 12/23.  Reports fever last night of 101.7.  Patient is accompanied by her husband this afternoon.  PMH significant for migraine and DDD of cervical spine.  Past Medical History:  Diagnosis Date   Blood transfusion without reported diagnosis    after birth of child 2013   H/O cesarean section    c sections x3   History of chicken pox    History of gestational diabetes    History of measles, mumps, or rubella    No pertinent past medical history     Patient Active Problem List   Diagnosis Date Noted   DDD (degenerative disc disease), cervical 04/05/2019   Palpitations 08/03/2016   Common migraine with intractable migraine 06/19/2016   Vasovagal near syncope 06/18/2016   Pre-syncope 06/18/2016   Dizziness 04/08/2012   Anemia associated with acute blood loss--due to adhesions and C/S 11/14/2011   Status post repeat low transverse cesarean section and BTL 11/11/2011   Menstrual cycle disorder 07/09/2011   Gestational diabetes mellitus in pregnancy 07/09/2011    Past Surgical History:  Procedure Laterality Date   BREAST CYST EXCISION Right 03/23/2004   CERVICAL FUSION     c4-c7,,may 2021   CESAREAN SECTION     right breast cystectomy Right    benign   TUBAL LIGATION      OB History     Gravida  3   Para  3   Term  3   Preterm      AB      Living  3      SAB      IAB      Ectopic      Multiple      Live Births  3            Home Medications    Prior to Admission medications   Medication Sig Start Date End Date Taking? Authorizing Provider  benzonatate (TESSALON) 200 MG capsule Take 1 capsule (200 mg total) by mouth 3  (three) times daily as needed for up to 7 days. 03/19/23 04/02/23 Yes Trevor Iha, FNP  oseltamivir (TAMIFLU) 75 MG capsule Take 1 capsule (75 mg total) by mouth every 12 (twelve) hours. 03/19/23  Yes Trevor Iha, FNP  predniSONE (DELTASONE) 50 MG tablet Take 1 tablet (50 mg total) by mouth daily for 5 days. 03/19/23  Yes Trevor Iha, FNP  promethazine-dextromethorphan (PROMETHAZINE-DM) 6.25-15 MG/5ML syrup Take 5 mLs by mouth 2 (two) times daily as needed for cough. 03/19/23  Yes Trevor Iha, FNP  acetaminophen (TYLENOL) 500 MG tablet Take 500 mg by mouth every 6 (six) hours as needed.    [provider]  amoxicillin (AMOXIL) 500 MG capsule Take 2 capsules (1,000 mg total) by mouth 2 (two) times daily. 08/06/22   Copland, Gwenlyn Found, MD  Cholecalciferol (VITAMIN D-3 PO) Take by mouth. Take one daily    [provider]  doxycycline (VIBRAMYCIN) 100 MG capsule Take 1 capsule (100 mg total) by mouth 2 (two) times daily. 08/06/22   Copland, Gwenlyn Found, MD  ibuprofen (ADVIL) 200 MG tablet Take 200 mg by  mouth every 6 (six) hours as needed. Patient not taking: Reported on 08/06/2022    [provider]  KRILL OIL PO Take by mouth.    [provider]  Rimegepant Sulfate (NURTEC) 75 MG TBDP Take one tablet (75 mg total) by mouth every other day. 08/28/22   Copland, Gwenlyn Found, MD  vitamin C (ASCORBIC ACID) 500 MG tablet Take 500 mg by mouth daily.    [provider]    Family History Family History  Problem Relation Age of Onset   Cancer Mother        cervical   Diabetes Mother    Hypertension Mother    Colon cancer Father        age 39   Asthma Brother    Cancer Maternal Aunt        colon   Coronary artery disease Maternal Grandfather    Arthritis Other    Diabetes Other    Hyperlipidemia Other    Hypertension Other    Colon polyps Neg Hx    Crohn's disease Neg Hx    Esophageal cancer Neg Hx    Rectal cancer Neg Hx    Stomach cancer Neg Hx     Ulcerative colitis Neg Hx     Social History Social History   Tobacco Use   Smoking status: Never   Smokeless tobacco: Never  Vaping Use   Vaping status: Never Used  Substance Use Topics   Alcohol use: Yes    Comment: Rarely- red wine   Drug use: No     Allergies   Patient has no known allergies.   Review of Systems Review of Systems   Physical Exam Triage Vital Signs ED Triage Vitals  Encounter Vitals Group     BP 03/19/23 1228 129/87     Systolic BP Percentile --      Diastolic BP Percentile --      Pulse Rate 03/19/23 1228 85     Resp 03/19/23 1228 17     Temp 03/19/23 1228 98.1 F (36.7 C)     Temp Source 03/19/23 1228 Oral     SpO2 03/19/23 1228 98 %     Weight --      Height --      Head Circumference --      Peak Flow --      Pain Score 03/19/23 1229 0     Pain Loc --      Pain Education --      Exclude from Growth Chart --    No data found.  Updated Vital Signs BP 129/87 (BP Location: Left Arm)   Pulse 85   Temp 98.1 F (36.7 C) (Oral)   Resp 17   SpO2 98%    Physical Exam Vitals and nursing note reviewed.  Constitutional:      Appearance: Normal appearance. She is normal weight.  HENT:     Head: Normocephalic and atraumatic.     Right Ear: Tympanic membrane, ear canal and external ear normal.     Left Ear: Tympanic membrane, ear canal and external ear normal.     Mouth/Throat:     Mouth: Mucous membranes are moist.     Pharynx: Oropharynx is clear.  Eyes:     Extraocular Movements: Extraocular movements intact.     Conjunctiva/sclera: Conjunctivae normal.     Pupils: Pupils are equal, round, and reactive to light.  Cardiovascular:     Rate and Rhythm: Normal rate and regular  rhythm.     Pulses: Normal pulses.     Heart sounds: Normal heart sounds.  Pulmonary:     Effort: Pulmonary effort is normal.     Breath sounds: Normal breath sounds. No wheezing, rhonchi or rales.  Musculoskeletal:        General: Normal range of  motion.     Cervical back: Normal range of motion and neck supple.  Skin:    General: Skin is warm and dry.  Neurological:     General: No focal deficit present.     Mental Status: She is alert and oriented to person, place, and time. Mental status is at baseline.  Psychiatric:        Mood and Affect: Mood normal.        Behavior: Behavior normal.      UC Treatments / Results  Labs (all labs ordered are listed, but only abnormal results are displayed) Labs Reviewed  POC INFLUENZA A AND B ANTIGEN (URGENT CARE ONLY) - Abnormal; Notable for the following components:      Result Value   Influenza A Ag Positive (*)    All other components within normal limits    EKG   Radiology No results found.  Procedures Procedures (including critical care time)  Medications Ordered in UC Medications - No data to display  Initial Impression / Assessment and Plan / UC Course  I have reviewed the triage vital signs and the nursing notes.  Pertinent labs & imaging results that were available during my care of the patient were reviewed by me and considered in my medical decision making (see chart for details).     MDM: 1.  Influenza A-Rx'd Tamiflu 75 mg capsule: Take 1 capsule twice daily x 7 days; 2.  Cough, unspecified-Rx'd prednisone 20 mg tablet: Take 3 tablets p.o. daily x 5 days, Rx'd Tessalon 200 mg capsule: Take 1 capsule 3 times daily, as needed, Rx'd Promethazine DM 6.25-15 mg/5 mL syrup: Take 5 mL twice daily, as needed for cough. Advised patient to take medications as directed with food to completion.  Advised patient to take prednisone with first dose of Tamiflu for the next 5 days.  Advised may take Tessalon capsules daily or as needed for cough.  Advised may use Promethazine DM at night prior to sleep for cough due to sedative effects.  Encouraged to increase daily water intake to 64 ounces per day while taking these medications.  Advised if symptoms worsen and/or unresolved please  follow-up with PCP or here for further evaluation. Final Clinical Impressions(s) / UC Diagnoses   Final diagnoses:  Cough, unspecified type  Influenza A  Fever, unspecified     Discharge Instructions      Advised patient to take medications as directed with food to completion.  Advised patient to take prednisone with first dose of Tamiflu for the next 5 days.  Advised may take Tessalon capsules daily or as needed for cough.  Advised may use Promethazine DM at night prior to sleep for cough due to sedative effects.  Advised patient may take OTC Tylenol 1 g for fever (oral temperature greater than 100.3) every 6 hours.  Encouraged to increase daily water intake to 64 ounces per day while taking these medications.  Advised if symptoms worsen and/or unresolved please follow-up with PCP or here for further evaluation.     ED Prescriptions     Medication Sig Dispense Auth. Provider   oseltamivir (TAMIFLU) 75 MG capsule Take 1 capsule (75  mg total) by mouth every 12 (twelve) hours. 10 capsule Trevor Iha, FNP   predniSONE (DELTASONE) 50 MG tablet Take 1 tablet (50 mg total) by mouth daily for 5 days. 5 tablet Trevor Iha, FNP   benzonatate (TESSALON) 200 MG capsule Take 1 capsule (200 mg total) by mouth 3 (three) times daily as needed for up to 7 days. 40 capsule Trevor Iha, FNP   promethazine-dextromethorphan (PROMETHAZINE-DM) 6.25-15 MG/5ML syrup Take 5 mLs by mouth 2 (two) times daily as needed for cough. 118 mL Trevor Iha, FNP      PDMP not reviewed this encounter.   Trevor Iha, FNP 03/19/23 1347

## 2023-03-19 NOTE — Discharge Instructions (Addendum)
Advised patient to take medications as directed with food to completion.  Advised patient to take prednisone with first dose of Tamiflu for the next 5 days.  Advised may take Tessalon capsules daily or as needed for cough.  Advised may use Promethazine DM at night prior to sleep for cough due to sedative effects.  Advised patient may take OTC Tylenol 1 g for fever (oral temperature greater than 100.3) every 6 hours.  Encouraged to increase daily water intake to 64 ounces per day while taking these medications.  Advised if symptoms worsen and/or unresolved please follow-up with PCP or here for further evaluation.

## 2023-04-26 ENCOUNTER — Other Ambulatory Visit (HOSPITAL_BASED_OUTPATIENT_CLINIC_OR_DEPARTMENT_OTHER): Payer: Self-pay

## 2023-04-26 ENCOUNTER — Telehealth: Payer: Self-pay

## 2023-04-26 NOTE — Telephone Encounter (Signed)
PA initiated via Covermymeds; KEY: [id number].   PA approved. The authorization is valid from 03/27/2023 through 04/25/2024

## 2023-07-21 ENCOUNTER — Encounter: Admitting: Family Medicine

## 2023-07-25 NOTE — Patient Instructions (Signed)
 It was great to see you again today, I will be in touch with your labs

## 2023-07-25 NOTE — Progress Notes (Unsigned)
 [facility] [address] [place], [place] [address] [phone]  Date:  07/28/2023   Name:  [name]   DOB:  12-29-1975   MRN:  [id number]  PCP:  Kaylee Partridge, MD    Chief Complaint: No chief complaint on file.   History of Present Illness:  [name] is a 48 y.o. very pleasant female patient who presents with the following:  Patient seen today for physical exam and concern of migraine headache I last saw her about a year ago when she was sick  History of palpitations, gestational diabetes, migraine headache, cervical fusion per Dr Lamon Pillow in 2021 She has 3 school-aged children, 15, 12, 11 She is a nurse in the cardiac ICU History of BTL   Pap completed 12/23, negative Colon cancer screening completed 2024 Can update blood work today Mammogram 12/23 Patient Active Problem List   Diagnosis Date Noted   DDD (degenerative disc disease), cervical 04/05/2019   Palpitations 08/03/2016   Common migraine with intractable migraine 06/19/2016   Vasovagal near syncope 06/18/2016   Pre-syncope 06/18/2016   Dizziness 04/08/2012   Anemia associated with acute blood loss--due to adhesions and C/S 11/14/2011   Status post repeat low transverse cesarean section and BTL 11/11/2011   Menstrual cycle disorder 07/09/2011   Gestational diabetes mellitus in pregnancy 07/09/2011    Past Medical History:  Diagnosis Date   Blood transfusion without reported diagnosis    after birth of child 2013   H/O cesarean section    c sections x3   History of chicken pox    History of gestational diabetes    History of measles, mumps, or rubella    No pertinent past medical history     Past Surgical History:  Procedure Laterality Date   BREAST CYST EXCISION Right 03/23/2004   CERVICAL FUSION     c4-c7,,may 2021   CESAREAN SECTION     right breast cystectomy Right    benign   TUBAL LIGATION      Social  History   Tobacco Use   Smoking status: Never   Smokeless tobacco: Never  Vaping Use   Vaping status: Never Used  Substance Use Topics   Alcohol use: Yes    Comment: Rarely- red wine   Drug use: No    Family History  Problem Relation Age of Onset   Cancer Mother        cervical   Diabetes Mother    Hypertension Mother    Colon cancer Father        age 35   Asthma Brother    Cancer Maternal Aunt        colon   Coronary artery disease Maternal Grandfather    Arthritis Other    Diabetes Other    Hyperlipidemia Other    Hypertension Other    Colon polyps Neg Hx    Crohn's disease Neg Hx    Esophageal cancer Neg Hx    Rectal cancer Neg Hx    Stomach cancer Neg Hx    Ulcerative colitis Neg Hx     No Known Allergies  Medication list has been reviewed and updated.  Current Outpatient Medications on File Prior to Visit  Medication Sig Dispense Refill   acetaminophen  (TYLENOL ) 500 MG tablet Take 500 mg by mouth every 6 (six) hours as needed.     amoxicillin  (AMOXIL ) 500 MG capsule Take 2 capsules (1,000  mg total) by mouth 2 (two) times daily. 40 capsule 0   Cholecalciferol (VITAMIN D -3 PO) Take by mouth. Take one daily     doxycycline  (VIBRAMYCIN ) 100 MG capsule Take 1 capsule (100 mg total) by mouth 2 (two) times daily. 20 capsule 0   ibuprofen  (ADVIL ) 200 MG tablet Take 200 mg by mouth every 6 (six) hours as needed. (Patient not taking: Reported on 08/06/2022)     KRILL OIL PO Take by mouth.     oseltamivir  (TAMIFLU ) 75 MG capsule Take 1 capsule (75 mg total) by mouth every 12 (twelve) hours. 10 capsule 0   predniSONE  (DELTASONE ) 50 MG tablet Take 1 tablet (50 mg total) by mouth daily for 5 days. 5 tablet 0   promethazine -dextromethorphan (PROMETHAZINE -DM) 6.25-15 MG/5ML syrup Take 5 mLs by mouth 2 (two) times daily as needed for cough. 118 mL 0   Rimegepant Sulfate  (NURTEC) 75 MG TBDP Take one tablet (75 mg total) by mouth every other day. 16 tablet 3   vitamin C  (ASCORBIC ACID) 500 MG tablet Take 500 mg by mouth daily.     No current facility-administered medications on file prior to visit.    Review of Systems:  As per HPI- otherwise negative.   Physical Examination: There were no vitals filed for this visit. There were no vitals filed for this visit. There is no height or weight on file to calculate BMI. Ideal Body Weight:    GEN: no acute distress. HEENT: Atraumatic, Normocephalic.  Ears and Nose: No external deformity. CV: RRR, No M/G/R. No JVD. No thrill. No extra heart sounds. PULM: CTA B, no wheezes, crackles, rhonchi. No retractions. No resp. distress. No accessory muscle use. ABD: S, NT, ND, +BS. No rebound. No HSM. EXTR: No c/c/e PSYCH: Normally interactive. Conversant.    Assessment and Plan: *** Physical exam today.  Encouraged healthy diet and exercise routine Will plan further follow- up pending labs.  Signed Gates Kasal, MD

## 2023-07-28 ENCOUNTER — Encounter: Payer: Self-pay | Admitting: Family Medicine

## 2023-07-28 ENCOUNTER — Other Ambulatory Visit (HOSPITAL_BASED_OUTPATIENT_CLINIC_OR_DEPARTMENT_OTHER): Payer: Self-pay

## 2023-07-28 ENCOUNTER — Ambulatory Visit (INDEPENDENT_AMBULATORY_CARE_PROVIDER_SITE_OTHER): Admitting: Family Medicine

## 2023-07-28 VITALS — BP 116/70 | HR 64 | Temp 97.6°F | Ht 63.0 in | Wt 138.2 lb

## 2023-07-28 DIAGNOSIS — Z13220 Encounter for screening for lipoid disorders: Secondary | ICD-10-CM

## 2023-07-28 DIAGNOSIS — Z130 Encounter for screening for diseases of the blood and blood-forming organs and certain disorders involving the immune mechanism: Secondary | ICD-10-CM | POA: Diagnosis not present

## 2023-07-28 DIAGNOSIS — Z131 Encounter for screening for diabetes mellitus: Secondary | ICD-10-CM | POA: Diagnosis not present

## 2023-07-28 DIAGNOSIS — R079 Chest pain, unspecified: Secondary | ICD-10-CM

## 2023-07-28 DIAGNOSIS — R42 Dizziness and giddiness: Secondary | ICD-10-CM

## 2023-07-28 DIAGNOSIS — Z1329 Encounter for screening for other suspected endocrine disorder: Secondary | ICD-10-CM | POA: Diagnosis not present

## 2023-07-28 DIAGNOSIS — Z0000 Encounter for general adult medical examination without abnormal findings: Secondary | ICD-10-CM | POA: Diagnosis not present

## 2023-07-28 DIAGNOSIS — G43709 Chronic migraine without aura, not intractable, without status migrainosus: Secondary | ICD-10-CM

## 2023-07-28 DIAGNOSIS — Z1231 Encounter for screening mammogram for malignant neoplasm of breast: Secondary | ICD-10-CM

## 2023-07-28 LAB — COMPREHENSIVE METABOLIC PANEL WITH GFR
ALT: 13 U/L (ref 0–35)
AST: 15 U/L (ref 0–37)
Albumin: 4.8 g/dL (ref 3.5–5.2)
Alkaline Phosphatase: 56 U/L (ref 39–117)
BUN: 11 mg/dL (ref 6–23)
CO2: 26 meq/L (ref 19–32)
Calcium: 9.1 mg/dL (ref 8.4–10.5)
Chloride: 101 meq/L (ref 96–112)
Creatinine, Ser: 0.71 mg/dL (ref 0.40–1.20)
GFR: 101.5 mL/min (ref >60.00)
Glucose, Bld: 100 mg/dL — ABNORMAL HIGH (ref 70–99)
Potassium: 3.8 meq/L (ref 3.5–5.1)
Sodium: 135 meq/L (ref 135–145)
Total Bilirubin: 0.7 mg/dL (ref 0.2–1.2)
Total Protein: 7.7 g/dL (ref 6.0–8.3)

## 2023-07-28 LAB — LIPID PANEL
Cholesterol: 218 mg/dL — ABNORMAL HIGH (ref 0–200)
HDL: 60.3 mg/dL (ref >39.00)
LDL Cholesterol: 133 mg/dL — ABNORMAL HIGH (ref 0–99)
NonHDL: 158.13
Total CHOL/HDL Ratio: 4
Triglycerides: 127 mg/dL (ref 0.0–149.0)
VLDL: 25.4 mg/dL (ref 0.0–40.0)

## 2023-07-28 LAB — CBC
HCT: 38.8 % (ref 36.0–46.0)
Hemoglobin: 13 g/dL (ref 12.0–15.0)
MCHC: 33.5 g/dL (ref 30.0–36.0)
MCV: 86.9 fl (ref 78.0–100.0)
Platelets: 280 10*3/uL (ref 150.0–400.0)
RBC: 4.47 Mil/uL (ref 3.87–5.11)
RDW: 14.3 % (ref 11.5–15.5)
WBC: 10.9 10*3/uL — ABNORMAL HIGH (ref 4.0–10.5)

## 2023-07-28 LAB — TSH: TSH: 1.39 u[IU]/mL (ref 0.35–5.50)

## 2023-07-28 LAB — TROPONIN I (HIGH SENSITIVITY): High Sens Troponin I: 3 ng/L (ref 2–17)

## 2023-07-28 LAB — HEMOGLOBIN A1C: Hgb A1c MFr Bld: 5.5 % (ref 4.6–6.5)

## 2023-07-28 MED ORDER — NURTEC 75 MG PO TBDP
75.0000 mg | ORAL_TABLET | ORAL | 3 refills | Status: AC
Start: 2023-07-28 — End: ?
  Filled 2023-07-28 (×2): qty 16, 32d supply, fill #0

## 2023-08-02 ENCOUNTER — Telehealth (HOSPITAL_BASED_OUTPATIENT_CLINIC_OR_DEPARTMENT_OTHER): Payer: Self-pay

## 2023-08-26 ENCOUNTER — Telehealth (HOSPITAL_BASED_OUTPATIENT_CLINIC_OR_DEPARTMENT_OTHER): Payer: Self-pay

## 2023-10-15 ENCOUNTER — Encounter (HOSPITAL_BASED_OUTPATIENT_CLINIC_OR_DEPARTMENT_OTHER): Payer: Self-pay

## 2024-08-09 ENCOUNTER — Encounter: Admitting: Family Medicine
